# Patient Record
Sex: Female | Born: 1945
Health system: Southern US, Community
[De-identification: ages and names within clinical notes are randomized; demographics above are authoritative.]

## PROBLEM LIST (undated history)

## (undated) DIAGNOSIS — G2581 Restless legs syndrome: Secondary | ICD-10-CM

## (undated) DIAGNOSIS — F319 Bipolar disorder, unspecified: Secondary | ICD-10-CM

## (undated) DIAGNOSIS — E7800 Pure hypercholesterolemia, unspecified: Secondary | ICD-10-CM

## (undated) DIAGNOSIS — I10 Essential (primary) hypertension: Secondary | ICD-10-CM

## (undated) DIAGNOSIS — G20A1 Parkinson's disease without dyskinesia, without mention of fluctuations: Secondary | ICD-10-CM

## (undated) DIAGNOSIS — K219 Gastro-esophageal reflux disease without esophagitis: Secondary | ICD-10-CM

## (undated) DIAGNOSIS — F0390 Unspecified dementia without behavioral disturbance: Secondary | ICD-10-CM

## (undated) DIAGNOSIS — R531 Weakness: Secondary | ICD-10-CM

## (undated) DIAGNOSIS — N390 Urinary tract infection, site not specified: Secondary | ICD-10-CM

## (undated) DIAGNOSIS — G4700 Insomnia, unspecified: Secondary | ICD-10-CM

## (undated) DIAGNOSIS — G309 Alzheimer's disease, unspecified: Secondary | ICD-10-CM

## (undated) DIAGNOSIS — E079 Disorder of thyroid, unspecified: Secondary | ICD-10-CM

## (undated) DIAGNOSIS — F329 Major depressive disorder, single episode, unspecified: Secondary | ICD-10-CM

## (undated) DIAGNOSIS — G20 Parkinson's disease: Secondary | ICD-10-CM

## (undated) DIAGNOSIS — M519 Unspecified thoracic, thoracolumbar and lumbosacral intervertebral disc disorder: Secondary | ICD-10-CM

## (undated) DIAGNOSIS — F32A Depression, unspecified: Secondary | ICD-10-CM

## (undated) DIAGNOSIS — F419 Anxiety disorder, unspecified: Secondary | ICD-10-CM

## (undated) DIAGNOSIS — F0280 Dementia in other diseases classified elsewhere without behavioral disturbance: Secondary | ICD-10-CM

---

## 2014-05-15 DIAGNOSIS — M1990 Unspecified osteoarthritis, unspecified site: Secondary | ICD-10-CM | POA: Diagnosis not present

## 2014-05-15 DIAGNOSIS — F419 Anxiety disorder, unspecified: Secondary | ICD-10-CM | POA: Diagnosis not present

## 2014-05-15 DIAGNOSIS — R739 Hyperglycemia, unspecified: Secondary | ICD-10-CM | POA: Diagnosis not present

## 2014-05-15 DIAGNOSIS — E559 Vitamin D deficiency, unspecified: Secondary | ICD-10-CM | POA: Diagnosis not present

## 2014-05-15 DIAGNOSIS — E785 Hyperlipidemia, unspecified: Secondary | ICD-10-CM | POA: Diagnosis not present

## 2014-05-15 DIAGNOSIS — R29818 Other symptoms and signs involving the nervous system: Secondary | ICD-10-CM | POA: Diagnosis not present

## 2014-05-15 DIAGNOSIS — Z79899 Other long term (current) drug therapy: Secondary | ICD-10-CM | POA: Diagnosis not present

## 2014-05-15 DIAGNOSIS — E039 Hypothyroidism, unspecified: Secondary | ICD-10-CM | POA: Diagnosis not present

## 2014-05-26 DIAGNOSIS — Z1231 Encounter for screening mammogram for malignant neoplasm of breast: Secondary | ICD-10-CM | POA: Diagnosis not present

## 2014-05-26 DIAGNOSIS — K760 Fatty (change of) liver, not elsewhere classified: Secondary | ICD-10-CM | POA: Diagnosis not present

## 2014-06-09 DIAGNOSIS — R928 Other abnormal and inconclusive findings on diagnostic imaging of breast: Secondary | ICD-10-CM | POA: Diagnosis not present

## 2014-07-16 DIAGNOSIS — F419 Anxiety disorder, unspecified: Secondary | ICD-10-CM | POA: Diagnosis not present

## 2014-07-16 DIAGNOSIS — G2581 Restless legs syndrome: Secondary | ICD-10-CM | POA: Diagnosis not present

## 2014-07-16 DIAGNOSIS — Z1389 Encounter for screening for other disorder: Secondary | ICD-10-CM | POA: Diagnosis not present

## 2014-07-16 DIAGNOSIS — Z9181 History of falling: Secondary | ICD-10-CM | POA: Diagnosis not present

## 2014-07-16 DIAGNOSIS — M1990 Unspecified osteoarthritis, unspecified site: Secondary | ICD-10-CM | POA: Diagnosis not present

## 2014-07-16 DIAGNOSIS — Z6833 Body mass index (BMI) 33.0-33.9, adult: Secondary | ICD-10-CM | POA: Diagnosis not present

## 2014-07-16 DIAGNOSIS — Z79899 Other long term (current) drug therapy: Secondary | ICD-10-CM | POA: Diagnosis not present

## 2014-08-07 DIAGNOSIS — E039 Hypothyroidism, unspecified: Secondary | ICD-10-CM | POA: Diagnosis not present

## 2014-08-07 DIAGNOSIS — E785 Hyperlipidemia, unspecified: Secondary | ICD-10-CM | POA: Diagnosis not present

## 2014-08-07 DIAGNOSIS — K219 Gastro-esophageal reflux disease without esophagitis: Secondary | ICD-10-CM | POA: Diagnosis not present

## 2014-08-07 DIAGNOSIS — F329 Major depressive disorder, single episode, unspecified: Secondary | ICD-10-CM | POA: Diagnosis not present

## 2014-08-07 DIAGNOSIS — I10 Essential (primary) hypertension: Secondary | ICD-10-CM | POA: Diagnosis not present

## 2014-08-07 DIAGNOSIS — E669 Obesity, unspecified: Secondary | ICD-10-CM | POA: Diagnosis not present

## 2014-08-07 DIAGNOSIS — G309 Alzheimer's disease, unspecified: Secondary | ICD-10-CM | POA: Diagnosis not present

## 2014-08-07 DIAGNOSIS — M79606 Pain in leg, unspecified: Secondary | ICD-10-CM | POA: Diagnosis not present

## 2014-08-07 DIAGNOSIS — M545 Low back pain: Secondary | ICD-10-CM | POA: Diagnosis not present

## 2014-09-08 DIAGNOSIS — E785 Hyperlipidemia, unspecified: Secondary | ICD-10-CM | POA: Diagnosis not present

## 2014-09-08 DIAGNOSIS — R739 Hyperglycemia, unspecified: Secondary | ICD-10-CM | POA: Diagnosis not present

## 2014-09-08 DIAGNOSIS — Z9181 History of falling: Secondary | ICD-10-CM | POA: Diagnosis not present

## 2014-09-08 DIAGNOSIS — E559 Vitamin D deficiency, unspecified: Secondary | ICD-10-CM | POA: Diagnosis not present

## 2014-09-08 DIAGNOSIS — G2581 Restless legs syndrome: Secondary | ICD-10-CM | POA: Diagnosis not present

## 2014-09-08 DIAGNOSIS — E039 Hypothyroidism, unspecified: Secondary | ICD-10-CM | POA: Diagnosis not present

## 2014-09-08 DIAGNOSIS — M1990 Unspecified osteoarthritis, unspecified site: Secondary | ICD-10-CM | POA: Diagnosis not present

## 2014-09-08 DIAGNOSIS — Z79899 Other long term (current) drug therapy: Secondary | ICD-10-CM | POA: Diagnosis not present

## 2014-10-30 DIAGNOSIS — F329 Major depressive disorder, single episode, unspecified: Secondary | ICD-10-CM | POA: Diagnosis not present

## 2014-10-30 DIAGNOSIS — R1012 Left upper quadrant pain: Secondary | ICD-10-CM | POA: Diagnosis not present

## 2014-10-30 DIAGNOSIS — K760 Fatty (change of) liver, not elsewhere classified: Secondary | ICD-10-CM | POA: Diagnosis not present

## 2014-10-30 DIAGNOSIS — F419 Anxiety disorder, unspecified: Secondary | ICD-10-CM | POA: Diagnosis not present

## 2014-10-30 DIAGNOSIS — E780 Pure hypercholesterolemia: Secondary | ICD-10-CM | POA: Diagnosis not present

## 2014-10-30 DIAGNOSIS — R1032 Left lower quadrant pain: Secondary | ICD-10-CM | POA: Diagnosis not present

## 2014-10-30 DIAGNOSIS — I10 Essential (primary) hypertension: Secondary | ICD-10-CM | POA: Diagnosis not present

## 2014-10-30 DIAGNOSIS — F0390 Unspecified dementia without behavioral disturbance: Secondary | ICD-10-CM | POA: Diagnosis not present

## 2014-10-30 DIAGNOSIS — G20 Parkinson's disease: Secondary | ICD-10-CM | POA: Diagnosis not present

## 2014-10-30 DIAGNOSIS — I517 Cardiomegaly: Secondary | ICD-10-CM | POA: Diagnosis not present

## 2014-10-30 DIAGNOSIS — M1990 Unspecified osteoarthritis, unspecified site: Secondary | ICD-10-CM | POA: Diagnosis not present

## 2014-10-30 DIAGNOSIS — K219 Gastro-esophageal reflux disease without esophagitis: Secondary | ICD-10-CM | POA: Diagnosis not present

## 2014-11-03 DIAGNOSIS — Z79899 Other long term (current) drug therapy: Secondary | ICD-10-CM | POA: Diagnosis not present

## 2014-11-03 DIAGNOSIS — K219 Gastro-esophageal reflux disease without esophagitis: Secondary | ICD-10-CM | POA: Diagnosis not present

## 2014-11-03 DIAGNOSIS — G20 Parkinson's disease: Secondary | ICD-10-CM | POA: Diagnosis not present

## 2014-11-03 DIAGNOSIS — R739 Hyperglycemia, unspecified: Secondary | ICD-10-CM | POA: Diagnosis not present

## 2014-11-03 DIAGNOSIS — R29818 Other symptoms and signs involving the nervous system: Secondary | ICD-10-CM | POA: Diagnosis not present

## 2014-11-03 DIAGNOSIS — Z6832 Body mass index (BMI) 32.0-32.9, adult: Secondary | ICD-10-CM | POA: Diagnosis not present

## 2014-11-03 DIAGNOSIS — M1990 Unspecified osteoarthritis, unspecified site: Secondary | ICD-10-CM | POA: Diagnosis not present

## 2014-12-14 DIAGNOSIS — Z79899 Other long term (current) drug therapy: Secondary | ICD-10-CM | POA: Diagnosis not present

## 2014-12-14 DIAGNOSIS — R531 Weakness: Secondary | ICD-10-CM | POA: Diagnosis not present

## 2014-12-14 DIAGNOSIS — I10 Essential (primary) hypertension: Secondary | ICD-10-CM | POA: Diagnosis not present

## 2014-12-14 DIAGNOSIS — R404 Transient alteration of awareness: Secondary | ICD-10-CM | POA: Diagnosis not present

## 2014-12-14 DIAGNOSIS — R079 Chest pain, unspecified: Secondary | ICD-10-CM | POA: Diagnosis not present

## 2014-12-18 DIAGNOSIS — G20 Parkinson's disease: Secondary | ICD-10-CM | POA: Diagnosis not present

## 2014-12-18 DIAGNOSIS — R531 Weakness: Secondary | ICD-10-CM | POA: Diagnosis not present

## 2014-12-18 DIAGNOSIS — Z6831 Body mass index (BMI) 31.0-31.9, adult: Secondary | ICD-10-CM | POA: Diagnosis not present

## 2014-12-18 DIAGNOSIS — I10 Essential (primary) hypertension: Secondary | ICD-10-CM | POA: Diagnosis not present

## 2014-12-18 DIAGNOSIS — K219 Gastro-esophageal reflux disease without esophagitis: Secondary | ICD-10-CM | POA: Diagnosis not present

## 2014-12-18 DIAGNOSIS — R928 Other abnormal and inconclusive findings on diagnostic imaging of breast: Secondary | ICD-10-CM | POA: Diagnosis not present

## 2014-12-18 DIAGNOSIS — F419 Anxiety disorder, unspecified: Secondary | ICD-10-CM | POA: Diagnosis not present

## 2014-12-28 DIAGNOSIS — R928 Other abnormal and inconclusive findings on diagnostic imaging of breast: Secondary | ICD-10-CM | POA: Diagnosis not present

## 2014-12-28 DIAGNOSIS — N6489 Other specified disorders of breast: Secondary | ICD-10-CM | POA: Diagnosis not present

## 2015-01-14 DIAGNOSIS — E663 Overweight: Secondary | ICD-10-CM | POA: Diagnosis not present

## 2015-01-14 DIAGNOSIS — I10 Essential (primary) hypertension: Secondary | ICD-10-CM | POA: Diagnosis not present

## 2015-01-14 DIAGNOSIS — G894 Chronic pain syndrome: Secondary | ICD-10-CM | POA: Diagnosis not present

## 2015-01-14 DIAGNOSIS — F419 Anxiety disorder, unspecified: Secondary | ICD-10-CM | POA: Diagnosis not present

## 2015-01-14 DIAGNOSIS — Z6829 Body mass index (BMI) 29.0-29.9, adult: Secondary | ICD-10-CM | POA: Diagnosis not present

## 2015-01-26 DIAGNOSIS — E785 Hyperlipidemia, unspecified: Secondary | ICD-10-CM | POA: Diagnosis not present

## 2015-01-26 DIAGNOSIS — R739 Hyperglycemia, unspecified: Secondary | ICD-10-CM | POA: Diagnosis not present

## 2015-01-26 DIAGNOSIS — F419 Anxiety disorder, unspecified: Secondary | ICD-10-CM | POA: Diagnosis not present

## 2015-01-26 DIAGNOSIS — E559 Vitamin D deficiency, unspecified: Secondary | ICD-10-CM | POA: Diagnosis not present

## 2015-01-26 DIAGNOSIS — Z79899 Other long term (current) drug therapy: Secondary | ICD-10-CM | POA: Diagnosis not present

## 2015-01-26 DIAGNOSIS — K219 Gastro-esophageal reflux disease without esophagitis: Secondary | ICD-10-CM | POA: Diagnosis not present

## 2015-01-26 DIAGNOSIS — E039 Hypothyroidism, unspecified: Secondary | ICD-10-CM | POA: Diagnosis not present

## 2015-01-26 DIAGNOSIS — I10 Essential (primary) hypertension: Secondary | ICD-10-CM | POA: Diagnosis not present

## 2015-03-01 DIAGNOSIS — F0280 Dementia in other diseases classified elsewhere without behavioral disturbance: Secondary | ICD-10-CM | POA: Diagnosis not present

## 2015-03-01 DIAGNOSIS — G301 Alzheimer's disease with late onset: Secondary | ICD-10-CM | POA: Diagnosis not present

## 2015-03-01 DIAGNOSIS — G2111 Neuroleptic induced parkinsonism: Secondary | ICD-10-CM | POA: Diagnosis not present

## 2015-03-02 DIAGNOSIS — Z79899 Other long term (current) drug therapy: Secondary | ICD-10-CM | POA: Diagnosis not present

## 2015-03-02 DIAGNOSIS — R300 Dysuria: Secondary | ICD-10-CM | POA: Diagnosis not present

## 2015-03-02 DIAGNOSIS — K219 Gastro-esophageal reflux disease without esophagitis: Secondary | ICD-10-CM | POA: Diagnosis not present

## 2015-03-02 DIAGNOSIS — N390 Urinary tract infection, site not specified: Secondary | ICD-10-CM | POA: Diagnosis not present

## 2015-03-02 DIAGNOSIS — Z1389 Encounter for screening for other disorder: Secondary | ICD-10-CM | POA: Diagnosis not present

## 2015-03-02 DIAGNOSIS — R531 Weakness: Secondary | ICD-10-CM | POA: Diagnosis not present

## 2015-03-02 DIAGNOSIS — F419 Anxiety disorder, unspecified: Secondary | ICD-10-CM | POA: Diagnosis not present

## 2015-03-02 DIAGNOSIS — M1990 Unspecified osteoarthritis, unspecified site: Secondary | ICD-10-CM | POA: Diagnosis not present

## 2015-05-03 DIAGNOSIS — Z79899 Other long term (current) drug therapy: Secondary | ICD-10-CM | POA: Diagnosis not present

## 2015-05-03 DIAGNOSIS — E039 Hypothyroidism, unspecified: Secondary | ICD-10-CM | POA: Diagnosis not present

## 2015-05-03 DIAGNOSIS — R739 Hyperglycemia, unspecified: Secondary | ICD-10-CM | POA: Diagnosis not present

## 2015-05-03 DIAGNOSIS — M1990 Unspecified osteoarthritis, unspecified site: Secondary | ICD-10-CM | POA: Diagnosis not present

## 2015-05-03 DIAGNOSIS — E559 Vitamin D deficiency, unspecified: Secondary | ICD-10-CM | POA: Diagnosis not present

## 2015-05-03 DIAGNOSIS — Z23 Encounter for immunization: Secondary | ICD-10-CM | POA: Diagnosis not present

## 2015-05-03 DIAGNOSIS — I10 Essential (primary) hypertension: Secondary | ICD-10-CM | POA: Diagnosis not present

## 2015-05-03 DIAGNOSIS — Z1389 Encounter for screening for other disorder: Secondary | ICD-10-CM | POA: Diagnosis not present

## 2015-05-03 DIAGNOSIS — E785 Hyperlipidemia, unspecified: Secondary | ICD-10-CM | POA: Diagnosis not present

## 2015-05-03 DIAGNOSIS — G309 Alzheimer's disease, unspecified: Secondary | ICD-10-CM | POA: Diagnosis not present

## 2015-05-25 DIAGNOSIS — H353132 Nonexudative age-related macular degeneration, bilateral, intermediate dry stage: Secondary | ICD-10-CM | POA: Diagnosis not present

## 2015-05-25 DIAGNOSIS — H5213 Myopia, bilateral: Secondary | ICD-10-CM | POA: Diagnosis not present

## 2015-05-25 DIAGNOSIS — H5210 Myopia, unspecified eye: Secondary | ICD-10-CM | POA: Diagnosis not present

## 2015-06-30 DIAGNOSIS — G894 Chronic pain syndrome: Secondary | ICD-10-CM | POA: Diagnosis not present

## 2015-06-30 DIAGNOSIS — R300 Dysuria: Secondary | ICD-10-CM | POA: Diagnosis not present

## 2015-06-30 DIAGNOSIS — Z79899 Other long term (current) drug therapy: Secondary | ICD-10-CM | POA: Diagnosis not present

## 2015-06-30 DIAGNOSIS — Z6824 Body mass index (BMI) 24.0-24.9, adult: Secondary | ICD-10-CM | POA: Diagnosis not present

## 2015-07-01 DIAGNOSIS — T402X4A Poisoning by other opioids, undetermined, initial encounter: Secondary | ICD-10-CM | POA: Diagnosis not present

## 2015-07-01 DIAGNOSIS — I469 Cardiac arrest, cause unspecified: Secondary | ICD-10-CM | POA: Diagnosis not present

## 2015-07-01 DIAGNOSIS — F0280 Dementia in other diseases classified elsewhere without behavioral disturbance: Secondary | ICD-10-CM | POA: Diagnosis not present

## 2015-07-01 DIAGNOSIS — R55 Syncope and collapse: Secondary | ICD-10-CM | POA: Diagnosis not present

## 2015-07-01 DIAGNOSIS — M545 Low back pain: Secondary | ICD-10-CM | POA: Diagnosis not present

## 2015-07-01 DIAGNOSIS — F419 Anxiety disorder, unspecified: Secondary | ICD-10-CM | POA: Diagnosis not present

## 2015-07-01 DIAGNOSIS — Z79899 Other long term (current) drug therapy: Secondary | ICD-10-CM | POA: Diagnosis not present

## 2015-07-01 DIAGNOSIS — T40602A Poisoning by unspecified narcotics, intentional self-harm, initial encounter: Secondary | ICD-10-CM | POA: Diagnosis not present

## 2015-07-01 DIAGNOSIS — G20 Parkinson's disease: Secondary | ICD-10-CM | POA: Diagnosis not present

## 2015-07-01 DIAGNOSIS — I160 Hypertensive urgency: Secondary | ICD-10-CM | POA: Diagnosis not present

## 2015-07-01 DIAGNOSIS — F333 Major depressive disorder, recurrent, severe with psychotic symptoms: Secondary | ICD-10-CM | POA: Diagnosis not present

## 2015-07-01 DIAGNOSIS — E785 Hyperlipidemia, unspecified: Secondary | ICD-10-CM | POA: Diagnosis not present

## 2015-07-01 DIAGNOSIS — R402431 Glasgow coma scale score 3-8, in the field [EMT or ambulance]: Secondary | ICD-10-CM | POA: Diagnosis not present

## 2015-07-01 DIAGNOSIS — I10 Essential (primary) hypertension: Secondary | ICD-10-CM | POA: Diagnosis not present

## 2015-07-01 DIAGNOSIS — G894 Chronic pain syndrome: Secondary | ICD-10-CM | POA: Diagnosis not present

## 2015-07-01 DIAGNOSIS — Z7982 Long term (current) use of aspirin: Secondary | ICD-10-CM | POA: Diagnosis not present

## 2015-07-01 DIAGNOSIS — K219 Gastro-esophageal reflux disease without esophagitis: Secondary | ICD-10-CM | POA: Diagnosis not present

## 2015-07-01 DIAGNOSIS — M1990 Unspecified osteoarthritis, unspecified site: Secondary | ICD-10-CM | POA: Diagnosis not present

## 2015-07-01 DIAGNOSIS — R9431 Abnormal electrocardiogram [ECG] [EKG]: Secondary | ICD-10-CM | POA: Diagnosis not present

## 2015-07-01 DIAGNOSIS — Z79891 Long term (current) use of opiate analgesic: Secondary | ICD-10-CM | POA: Diagnosis not present

## 2015-07-01 DIAGNOSIS — E039 Hypothyroidism, unspecified: Secondary | ICD-10-CM | POA: Diagnosis not present

## 2015-07-01 DIAGNOSIS — I361 Nonrheumatic tricuspid (valve) insufficiency: Secondary | ICD-10-CM | POA: Diagnosis not present

## 2015-07-01 DIAGNOSIS — T40601S Poisoning by unspecified narcotics, accidental (unintentional), sequela: Secondary | ICD-10-CM | POA: Diagnosis not present

## 2015-07-09 DIAGNOSIS — K219 Gastro-esophageal reflux disease without esophagitis: Secondary | ICD-10-CM | POA: Diagnosis not present

## 2015-07-09 DIAGNOSIS — F0390 Unspecified dementia without behavioral disturbance: Secondary | ICD-10-CM | POA: Diagnosis not present

## 2015-07-09 DIAGNOSIS — G20 Parkinson's disease: Secondary | ICD-10-CM | POA: Diagnosis not present

## 2015-07-09 DIAGNOSIS — E784 Other hyperlipidemia: Secondary | ICD-10-CM | POA: Diagnosis not present

## 2015-07-09 DIAGNOSIS — F419 Anxiety disorder, unspecified: Secondary | ICD-10-CM | POA: Diagnosis not present

## 2015-07-09 DIAGNOSIS — M1990 Unspecified osteoarthritis, unspecified site: Secondary | ICD-10-CM | POA: Diagnosis not present

## 2015-07-09 DIAGNOSIS — E785 Hyperlipidemia, unspecified: Secondary | ICD-10-CM | POA: Diagnosis not present

## 2015-07-09 DIAGNOSIS — I10 Essential (primary) hypertension: Secondary | ICD-10-CM | POA: Diagnosis not present

## 2015-07-09 DIAGNOSIS — E039 Hypothyroidism, unspecified: Secondary | ICD-10-CM | POA: Diagnosis not present

## 2015-07-09 DIAGNOSIS — T40691D Poisoning by other narcotics, accidental (unintentional), subsequent encounter: Secondary | ICD-10-CM | POA: Diagnosis not present

## 2015-07-09 DIAGNOSIS — T40601S Poisoning by unspecified narcotics, accidental (unintentional), sequela: Secondary | ICD-10-CM | POA: Diagnosis not present

## 2015-07-09 DIAGNOSIS — F333 Major depressive disorder, recurrent, severe with psychotic symptoms: Secondary | ICD-10-CM | POA: Diagnosis not present

## 2015-07-16 DIAGNOSIS — T40691D Poisoning by other narcotics, accidental (unintentional), subsequent encounter: Secondary | ICD-10-CM | POA: Diagnosis not present

## 2015-07-16 DIAGNOSIS — E784 Other hyperlipidemia: Secondary | ICD-10-CM | POA: Diagnosis not present

## 2015-07-16 DIAGNOSIS — I10 Essential (primary) hypertension: Secondary | ICD-10-CM | POA: Diagnosis not present

## 2015-07-16 DIAGNOSIS — G20 Parkinson's disease: Secondary | ICD-10-CM | POA: Diagnosis not present

## 2015-07-20 DIAGNOSIS — G8929 Other chronic pain: Secondary | ICD-10-CM | POA: Diagnosis not present

## 2015-07-20 DIAGNOSIS — Z9181 History of falling: Secondary | ICD-10-CM | POA: Diagnosis not present

## 2015-07-20 DIAGNOSIS — I10 Essential (primary) hypertension: Secondary | ICD-10-CM | POA: Diagnosis not present

## 2015-07-20 DIAGNOSIS — Z8674 Personal history of sudden cardiac arrest: Secondary | ICD-10-CM | POA: Diagnosis not present

## 2015-07-20 DIAGNOSIS — G301 Alzheimer's disease with late onset: Secondary | ICD-10-CM | POA: Diagnosis not present

## 2015-07-20 DIAGNOSIS — F419 Anxiety disorder, unspecified: Secondary | ICD-10-CM | POA: Diagnosis not present

## 2015-07-20 DIAGNOSIS — M6281 Muscle weakness (generalized): Secondary | ICD-10-CM | POA: Diagnosis not present

## 2015-07-20 DIAGNOSIS — F0280 Dementia in other diseases classified elsewhere without behavioral disturbance: Secondary | ICD-10-CM | POA: Diagnosis not present

## 2015-07-20 DIAGNOSIS — F329 Major depressive disorder, single episode, unspecified: Secondary | ICD-10-CM | POA: Diagnosis not present

## 2015-07-22 DIAGNOSIS — Z8674 Personal history of sudden cardiac arrest: Secondary | ICD-10-CM | POA: Diagnosis not present

## 2015-07-22 DIAGNOSIS — F0280 Dementia in other diseases classified elsewhere without behavioral disturbance: Secondary | ICD-10-CM | POA: Diagnosis not present

## 2015-07-22 DIAGNOSIS — G8929 Other chronic pain: Secondary | ICD-10-CM | POA: Diagnosis not present

## 2015-07-22 DIAGNOSIS — G301 Alzheimer's disease with late onset: Secondary | ICD-10-CM | POA: Diagnosis not present

## 2015-07-22 DIAGNOSIS — F329 Major depressive disorder, single episode, unspecified: Secondary | ICD-10-CM | POA: Diagnosis not present

## 2015-07-22 DIAGNOSIS — Z9181 History of falling: Secondary | ICD-10-CM | POA: Diagnosis not present

## 2015-07-22 DIAGNOSIS — I10 Essential (primary) hypertension: Secondary | ICD-10-CM | POA: Diagnosis not present

## 2015-07-22 DIAGNOSIS — F419 Anxiety disorder, unspecified: Secondary | ICD-10-CM | POA: Diagnosis not present

## 2015-07-22 DIAGNOSIS — M6281 Muscle weakness (generalized): Secondary | ICD-10-CM | POA: Diagnosis not present

## 2015-07-23 DIAGNOSIS — T50901A Poisoning by unspecified drugs, medicaments and biological substances, accidental (unintentional), initial encounter: Secondary | ICD-10-CM | POA: Diagnosis not present

## 2015-07-23 DIAGNOSIS — Z8674 Personal history of sudden cardiac arrest: Secondary | ICD-10-CM | POA: Diagnosis not present

## 2015-07-23 DIAGNOSIS — F419 Anxiety disorder, unspecified: Secondary | ICD-10-CM | POA: Diagnosis not present

## 2015-07-23 DIAGNOSIS — Z9181 History of falling: Secondary | ICD-10-CM | POA: Diagnosis not present

## 2015-07-23 DIAGNOSIS — G894 Chronic pain syndrome: Secondary | ICD-10-CM | POA: Diagnosis not present

## 2015-07-23 DIAGNOSIS — E039 Hypothyroidism, unspecified: Secondary | ICD-10-CM | POA: Diagnosis not present

## 2015-07-23 DIAGNOSIS — I10 Essential (primary) hypertension: Secondary | ICD-10-CM | POA: Diagnosis not present

## 2015-07-23 DIAGNOSIS — Z6823 Body mass index (BMI) 23.0-23.9, adult: Secondary | ICD-10-CM | POA: Diagnosis not present

## 2015-07-23 DIAGNOSIS — M6281 Muscle weakness (generalized): Secondary | ICD-10-CM | POA: Diagnosis not present

## 2015-07-23 DIAGNOSIS — G301 Alzheimer's disease with late onset: Secondary | ICD-10-CM | POA: Diagnosis not present

## 2015-07-23 DIAGNOSIS — F329 Major depressive disorder, single episode, unspecified: Secondary | ICD-10-CM | POA: Diagnosis not present

## 2015-07-23 DIAGNOSIS — Z79899 Other long term (current) drug therapy: Secondary | ICD-10-CM | POA: Diagnosis not present

## 2015-07-23 DIAGNOSIS — F0280 Dementia in other diseases classified elsewhere without behavioral disturbance: Secondary | ICD-10-CM | POA: Diagnosis not present

## 2015-07-23 DIAGNOSIS — R296 Repeated falls: Secondary | ICD-10-CM | POA: Diagnosis not present

## 2015-07-23 DIAGNOSIS — G8929 Other chronic pain: Secondary | ICD-10-CM | POA: Diagnosis not present

## 2015-07-26 DIAGNOSIS — G8929 Other chronic pain: Secondary | ICD-10-CM | POA: Diagnosis not present

## 2015-07-26 DIAGNOSIS — Z9181 History of falling: Secondary | ICD-10-CM | POA: Diagnosis not present

## 2015-07-26 DIAGNOSIS — F329 Major depressive disorder, single episode, unspecified: Secondary | ICD-10-CM | POA: Diagnosis not present

## 2015-07-26 DIAGNOSIS — I10 Essential (primary) hypertension: Secondary | ICD-10-CM | POA: Diagnosis not present

## 2015-07-26 DIAGNOSIS — Z8674 Personal history of sudden cardiac arrest: Secondary | ICD-10-CM | POA: Diagnosis not present

## 2015-07-26 DIAGNOSIS — F419 Anxiety disorder, unspecified: Secondary | ICD-10-CM | POA: Diagnosis not present

## 2015-07-26 DIAGNOSIS — G301 Alzheimer's disease with late onset: Secondary | ICD-10-CM | POA: Diagnosis not present

## 2015-07-26 DIAGNOSIS — M6281 Muscle weakness (generalized): Secondary | ICD-10-CM | POA: Diagnosis not present

## 2015-07-26 DIAGNOSIS — F0280 Dementia in other diseases classified elsewhere without behavioral disturbance: Secondary | ICD-10-CM | POA: Diagnosis not present

## 2015-07-27 DIAGNOSIS — G301 Alzheimer's disease with late onset: Secondary | ICD-10-CM | POA: Diagnosis not present

## 2015-07-27 DIAGNOSIS — F419 Anxiety disorder, unspecified: Secondary | ICD-10-CM | POA: Diagnosis not present

## 2015-07-27 DIAGNOSIS — F0280 Dementia in other diseases classified elsewhere without behavioral disturbance: Secondary | ICD-10-CM | POA: Diagnosis not present

## 2015-07-27 DIAGNOSIS — Z9181 History of falling: Secondary | ICD-10-CM | POA: Diagnosis not present

## 2015-07-27 DIAGNOSIS — I10 Essential (primary) hypertension: Secondary | ICD-10-CM | POA: Diagnosis not present

## 2015-07-27 DIAGNOSIS — G8929 Other chronic pain: Secondary | ICD-10-CM | POA: Diagnosis not present

## 2015-07-27 DIAGNOSIS — Z8674 Personal history of sudden cardiac arrest: Secondary | ICD-10-CM | POA: Diagnosis not present

## 2015-07-27 DIAGNOSIS — F329 Major depressive disorder, single episode, unspecified: Secondary | ICD-10-CM | POA: Diagnosis not present

## 2015-07-27 DIAGNOSIS — M6281 Muscle weakness (generalized): Secondary | ICD-10-CM | POA: Diagnosis not present

## 2015-07-28 DIAGNOSIS — I10 Essential (primary) hypertension: Secondary | ICD-10-CM | POA: Diagnosis not present

## 2015-07-28 DIAGNOSIS — G8929 Other chronic pain: Secondary | ICD-10-CM | POA: Diagnosis not present

## 2015-07-28 DIAGNOSIS — M6281 Muscle weakness (generalized): Secondary | ICD-10-CM | POA: Diagnosis not present

## 2015-07-28 DIAGNOSIS — F419 Anxiety disorder, unspecified: Secondary | ICD-10-CM | POA: Diagnosis not present

## 2015-07-28 DIAGNOSIS — F0280 Dementia in other diseases classified elsewhere without behavioral disturbance: Secondary | ICD-10-CM | POA: Diagnosis not present

## 2015-07-28 DIAGNOSIS — G301 Alzheimer's disease with late onset: Secondary | ICD-10-CM | POA: Diagnosis not present

## 2015-07-28 DIAGNOSIS — Z8674 Personal history of sudden cardiac arrest: Secondary | ICD-10-CM | POA: Diagnosis not present

## 2015-07-28 DIAGNOSIS — Z9181 History of falling: Secondary | ICD-10-CM | POA: Diagnosis not present

## 2015-07-28 DIAGNOSIS — F329 Major depressive disorder, single episode, unspecified: Secondary | ICD-10-CM | POA: Diagnosis not present

## 2015-07-29 DIAGNOSIS — G8929 Other chronic pain: Secondary | ICD-10-CM | POA: Diagnosis not present

## 2015-07-29 DIAGNOSIS — Z9181 History of falling: Secondary | ICD-10-CM | POA: Diagnosis not present

## 2015-07-29 DIAGNOSIS — F0280 Dementia in other diseases classified elsewhere without behavioral disturbance: Secondary | ICD-10-CM | POA: Diagnosis not present

## 2015-07-29 DIAGNOSIS — F419 Anxiety disorder, unspecified: Secondary | ICD-10-CM | POA: Diagnosis not present

## 2015-07-29 DIAGNOSIS — I10 Essential (primary) hypertension: Secondary | ICD-10-CM | POA: Diagnosis not present

## 2015-07-29 DIAGNOSIS — M6281 Muscle weakness (generalized): Secondary | ICD-10-CM | POA: Diagnosis not present

## 2015-07-29 DIAGNOSIS — G301 Alzheimer's disease with late onset: Secondary | ICD-10-CM | POA: Diagnosis not present

## 2015-07-29 DIAGNOSIS — F329 Major depressive disorder, single episode, unspecified: Secondary | ICD-10-CM | POA: Diagnosis not present

## 2015-07-29 DIAGNOSIS — Z8674 Personal history of sudden cardiac arrest: Secondary | ICD-10-CM | POA: Diagnosis not present

## 2015-08-03 DIAGNOSIS — G8929 Other chronic pain: Secondary | ICD-10-CM | POA: Diagnosis not present

## 2015-08-03 DIAGNOSIS — F419 Anxiety disorder, unspecified: Secondary | ICD-10-CM | POA: Diagnosis not present

## 2015-08-03 DIAGNOSIS — F329 Major depressive disorder, single episode, unspecified: Secondary | ICD-10-CM | POA: Diagnosis not present

## 2015-08-03 DIAGNOSIS — F0280 Dementia in other diseases classified elsewhere without behavioral disturbance: Secondary | ICD-10-CM | POA: Diagnosis not present

## 2015-08-03 DIAGNOSIS — Z9181 History of falling: Secondary | ICD-10-CM | POA: Diagnosis not present

## 2015-08-03 DIAGNOSIS — I10 Essential (primary) hypertension: Secondary | ICD-10-CM | POA: Diagnosis not present

## 2015-08-03 DIAGNOSIS — G301 Alzheimer's disease with late onset: Secondary | ICD-10-CM | POA: Diagnosis not present

## 2015-08-03 DIAGNOSIS — M6281 Muscle weakness (generalized): Secondary | ICD-10-CM | POA: Diagnosis not present

## 2015-08-03 DIAGNOSIS — Z8674 Personal history of sudden cardiac arrest: Secondary | ICD-10-CM | POA: Diagnosis not present

## 2015-08-04 DIAGNOSIS — G301 Alzheimer's disease with late onset: Secondary | ICD-10-CM | POA: Diagnosis not present

## 2015-08-04 DIAGNOSIS — F329 Major depressive disorder, single episode, unspecified: Secondary | ICD-10-CM | POA: Diagnosis not present

## 2015-08-04 DIAGNOSIS — Z9181 History of falling: Secondary | ICD-10-CM | POA: Diagnosis not present

## 2015-08-04 DIAGNOSIS — Z8674 Personal history of sudden cardiac arrest: Secondary | ICD-10-CM | POA: Diagnosis not present

## 2015-08-04 DIAGNOSIS — G8929 Other chronic pain: Secondary | ICD-10-CM | POA: Diagnosis not present

## 2015-08-04 DIAGNOSIS — M6281 Muscle weakness (generalized): Secondary | ICD-10-CM | POA: Diagnosis not present

## 2015-08-04 DIAGNOSIS — F419 Anxiety disorder, unspecified: Secondary | ICD-10-CM | POA: Diagnosis not present

## 2015-08-04 DIAGNOSIS — I10 Essential (primary) hypertension: Secondary | ICD-10-CM | POA: Diagnosis not present

## 2015-08-04 DIAGNOSIS — F0280 Dementia in other diseases classified elsewhere without behavioral disturbance: Secondary | ICD-10-CM | POA: Diagnosis not present

## 2015-08-06 DIAGNOSIS — F419 Anxiety disorder, unspecified: Secondary | ICD-10-CM | POA: Diagnosis not present

## 2015-08-06 DIAGNOSIS — F329 Major depressive disorder, single episode, unspecified: Secondary | ICD-10-CM | POA: Diagnosis not present

## 2015-08-06 DIAGNOSIS — Z9181 History of falling: Secondary | ICD-10-CM | POA: Diagnosis not present

## 2015-08-06 DIAGNOSIS — I10 Essential (primary) hypertension: Secondary | ICD-10-CM | POA: Diagnosis not present

## 2015-08-06 DIAGNOSIS — F0280 Dementia in other diseases classified elsewhere without behavioral disturbance: Secondary | ICD-10-CM | POA: Diagnosis not present

## 2015-08-06 DIAGNOSIS — Z8674 Personal history of sudden cardiac arrest: Secondary | ICD-10-CM | POA: Diagnosis not present

## 2015-08-06 DIAGNOSIS — M6281 Muscle weakness (generalized): Secondary | ICD-10-CM | POA: Diagnosis not present

## 2015-08-06 DIAGNOSIS — G301 Alzheimer's disease with late onset: Secondary | ICD-10-CM | POA: Diagnosis not present

## 2015-08-06 DIAGNOSIS — G8929 Other chronic pain: Secondary | ICD-10-CM | POA: Diagnosis not present

## 2015-08-09 DIAGNOSIS — M6281 Muscle weakness (generalized): Secondary | ICD-10-CM | POA: Diagnosis not present

## 2015-08-09 DIAGNOSIS — I10 Essential (primary) hypertension: Secondary | ICD-10-CM | POA: Diagnosis not present

## 2015-08-09 DIAGNOSIS — G301 Alzheimer's disease with late onset: Secondary | ICD-10-CM | POA: Diagnosis not present

## 2015-08-09 DIAGNOSIS — F0280 Dementia in other diseases classified elsewhere without behavioral disturbance: Secondary | ICD-10-CM | POA: Diagnosis not present

## 2015-08-09 DIAGNOSIS — Z9181 History of falling: Secondary | ICD-10-CM | POA: Diagnosis not present

## 2015-08-09 DIAGNOSIS — G8929 Other chronic pain: Secondary | ICD-10-CM | POA: Diagnosis not present

## 2015-08-09 DIAGNOSIS — Z8674 Personal history of sudden cardiac arrest: Secondary | ICD-10-CM | POA: Diagnosis not present

## 2015-08-09 DIAGNOSIS — F329 Major depressive disorder, single episode, unspecified: Secondary | ICD-10-CM | POA: Diagnosis not present

## 2015-08-09 DIAGNOSIS — F419 Anxiety disorder, unspecified: Secondary | ICD-10-CM | POA: Diagnosis not present

## 2015-08-10 DIAGNOSIS — G301 Alzheimer's disease with late onset: Secondary | ICD-10-CM | POA: Diagnosis not present

## 2015-08-10 DIAGNOSIS — I10 Essential (primary) hypertension: Secondary | ICD-10-CM | POA: Diagnosis not present

## 2015-08-10 DIAGNOSIS — F0280 Dementia in other diseases classified elsewhere without behavioral disturbance: Secondary | ICD-10-CM | POA: Diagnosis not present

## 2015-08-10 DIAGNOSIS — Z8674 Personal history of sudden cardiac arrest: Secondary | ICD-10-CM | POA: Diagnosis not present

## 2015-08-10 DIAGNOSIS — F419 Anxiety disorder, unspecified: Secondary | ICD-10-CM | POA: Diagnosis not present

## 2015-08-10 DIAGNOSIS — M6281 Muscle weakness (generalized): Secondary | ICD-10-CM | POA: Diagnosis not present

## 2015-08-10 DIAGNOSIS — G8929 Other chronic pain: Secondary | ICD-10-CM | POA: Diagnosis not present

## 2015-08-10 DIAGNOSIS — Z9181 History of falling: Secondary | ICD-10-CM | POA: Diagnosis not present

## 2015-08-10 DIAGNOSIS — F329 Major depressive disorder, single episode, unspecified: Secondary | ICD-10-CM | POA: Diagnosis not present

## 2015-08-11 DIAGNOSIS — F329 Major depressive disorder, single episode, unspecified: Secondary | ICD-10-CM | POA: Diagnosis not present

## 2015-08-11 DIAGNOSIS — G8929 Other chronic pain: Secondary | ICD-10-CM | POA: Diagnosis not present

## 2015-08-11 DIAGNOSIS — F419 Anxiety disorder, unspecified: Secondary | ICD-10-CM | POA: Diagnosis not present

## 2015-08-11 DIAGNOSIS — Z9181 History of falling: Secondary | ICD-10-CM | POA: Diagnosis not present

## 2015-08-11 DIAGNOSIS — F0280 Dementia in other diseases classified elsewhere without behavioral disturbance: Secondary | ICD-10-CM | POA: Diagnosis not present

## 2015-08-11 DIAGNOSIS — M6281 Muscle weakness (generalized): Secondary | ICD-10-CM | POA: Diagnosis not present

## 2015-08-11 DIAGNOSIS — G301 Alzheimer's disease with late onset: Secondary | ICD-10-CM | POA: Diagnosis not present

## 2015-08-11 DIAGNOSIS — I10 Essential (primary) hypertension: Secondary | ICD-10-CM | POA: Diagnosis not present

## 2015-08-11 DIAGNOSIS — Z8674 Personal history of sudden cardiac arrest: Secondary | ICD-10-CM | POA: Diagnosis not present

## 2015-08-17 DIAGNOSIS — Z9181 History of falling: Secondary | ICD-10-CM | POA: Diagnosis not present

## 2015-08-17 DIAGNOSIS — F329 Major depressive disorder, single episode, unspecified: Secondary | ICD-10-CM | POA: Diagnosis not present

## 2015-08-17 DIAGNOSIS — M6281 Muscle weakness (generalized): Secondary | ICD-10-CM | POA: Diagnosis not present

## 2015-08-17 DIAGNOSIS — I10 Essential (primary) hypertension: Secondary | ICD-10-CM | POA: Diagnosis not present

## 2015-08-17 DIAGNOSIS — G301 Alzheimer's disease with late onset: Secondary | ICD-10-CM | POA: Diagnosis not present

## 2015-08-17 DIAGNOSIS — F0280 Dementia in other diseases classified elsewhere without behavioral disturbance: Secondary | ICD-10-CM | POA: Diagnosis not present

## 2015-08-17 DIAGNOSIS — Z8674 Personal history of sudden cardiac arrest: Secondary | ICD-10-CM | POA: Diagnosis not present

## 2015-08-17 DIAGNOSIS — F419 Anxiety disorder, unspecified: Secondary | ICD-10-CM | POA: Diagnosis not present

## 2015-08-17 DIAGNOSIS — G8929 Other chronic pain: Secondary | ICD-10-CM | POA: Diagnosis not present

## 2015-08-19 DIAGNOSIS — F329 Major depressive disorder, single episode, unspecified: Secondary | ICD-10-CM | POA: Diagnosis not present

## 2015-08-19 DIAGNOSIS — F419 Anxiety disorder, unspecified: Secondary | ICD-10-CM | POA: Diagnosis not present

## 2015-08-19 DIAGNOSIS — Z8674 Personal history of sudden cardiac arrest: Secondary | ICD-10-CM | POA: Diagnosis not present

## 2015-08-19 DIAGNOSIS — G301 Alzheimer's disease with late onset: Secondary | ICD-10-CM | POA: Diagnosis not present

## 2015-08-19 DIAGNOSIS — G8929 Other chronic pain: Secondary | ICD-10-CM | POA: Diagnosis not present

## 2015-08-19 DIAGNOSIS — F0280 Dementia in other diseases classified elsewhere without behavioral disturbance: Secondary | ICD-10-CM | POA: Diagnosis not present

## 2015-08-19 DIAGNOSIS — M6281 Muscle weakness (generalized): Secondary | ICD-10-CM | POA: Diagnosis not present

## 2015-08-19 DIAGNOSIS — Z9181 History of falling: Secondary | ICD-10-CM | POA: Diagnosis not present

## 2015-08-19 DIAGNOSIS — I10 Essential (primary) hypertension: Secondary | ICD-10-CM | POA: Diagnosis not present

## 2015-08-27 DIAGNOSIS — R29898 Other symptoms and signs involving the musculoskeletal system: Secondary | ICD-10-CM | POA: Diagnosis not present

## 2015-08-27 DIAGNOSIS — Z1231 Encounter for screening mammogram for malignant neoplasm of breast: Secondary | ICD-10-CM | POA: Diagnosis not present

## 2015-08-27 DIAGNOSIS — R300 Dysuria: Secondary | ICD-10-CM | POA: Diagnosis not present

## 2015-08-27 DIAGNOSIS — Z79899 Other long term (current) drug therapy: Secondary | ICD-10-CM | POA: Diagnosis not present

## 2015-08-27 DIAGNOSIS — R739 Hyperglycemia, unspecified: Secondary | ICD-10-CM | POA: Diagnosis not present

## 2015-08-27 DIAGNOSIS — Z6822 Body mass index (BMI) 22.0-22.9, adult: Secondary | ICD-10-CM | POA: Diagnosis not present

## 2015-08-27 DIAGNOSIS — E039 Hypothyroidism, unspecified: Secondary | ICD-10-CM | POA: Diagnosis not present

## 2015-08-27 DIAGNOSIS — G894 Chronic pain syndrome: Secondary | ICD-10-CM | POA: Diagnosis not present

## 2015-08-27 DIAGNOSIS — E785 Hyperlipidemia, unspecified: Secondary | ICD-10-CM | POA: Diagnosis not present

## 2015-09-04 DIAGNOSIS — N390 Urinary tract infection, site not specified: Secondary | ICD-10-CM | POA: Diagnosis not present

## 2015-09-04 DIAGNOSIS — R1084 Generalized abdominal pain: Secondary | ICD-10-CM | POA: Diagnosis not present

## 2015-09-04 DIAGNOSIS — R296 Repeated falls: Secondary | ICD-10-CM | POA: Diagnosis not present

## 2015-09-04 DIAGNOSIS — R531 Weakness: Secondary | ICD-10-CM | POA: Diagnosis not present

## 2015-09-04 DIAGNOSIS — G20 Parkinson's disease: Secondary | ICD-10-CM | POA: Diagnosis not present

## 2015-09-04 DIAGNOSIS — R55 Syncope and collapse: Secondary | ICD-10-CM | POA: Diagnosis not present

## 2015-09-04 DIAGNOSIS — S7001XA Contusion of right hip, initial encounter: Secondary | ICD-10-CM | POA: Diagnosis not present

## 2015-09-04 DIAGNOSIS — F0280 Dementia in other diseases classified elsewhere without behavioral disturbance: Secondary | ICD-10-CM | POA: Diagnosis not present

## 2015-09-04 DIAGNOSIS — S40011A Contusion of right shoulder, initial encounter: Secondary | ICD-10-CM | POA: Diagnosis not present

## 2015-09-04 DIAGNOSIS — R079 Chest pain, unspecified: Secondary | ICD-10-CM | POA: Diagnosis not present

## 2015-09-09 DIAGNOSIS — Z9181 History of falling: Secondary | ICD-10-CM | POA: Diagnosis not present

## 2015-09-09 DIAGNOSIS — G309 Alzheimer's disease, unspecified: Secondary | ICD-10-CM | POA: Diagnosis not present

## 2015-09-09 DIAGNOSIS — N390 Urinary tract infection, site not specified: Secondary | ICD-10-CM | POA: Diagnosis not present

## 2015-09-09 DIAGNOSIS — Z6822 Body mass index (BMI) 22.0-22.9, adult: Secondary | ICD-10-CM | POA: Diagnosis not present

## 2015-09-09 DIAGNOSIS — I10 Essential (primary) hypertension: Secondary | ICD-10-CM | POA: Diagnosis not present

## 2015-09-29 DIAGNOSIS — M1990 Unspecified osteoarthritis, unspecified site: Secondary | ICD-10-CM | POA: Diagnosis not present

## 2015-09-29 DIAGNOSIS — G309 Alzheimer's disease, unspecified: Secondary | ICD-10-CM | POA: Diagnosis not present

## 2015-09-29 DIAGNOSIS — I10 Essential (primary) hypertension: Secondary | ICD-10-CM | POA: Diagnosis not present

## 2015-09-29 DIAGNOSIS — Z79899 Other long term (current) drug therapy: Secondary | ICD-10-CM | POA: Diagnosis not present

## 2015-09-29 DIAGNOSIS — N3281 Overactive bladder: Secondary | ICD-10-CM | POA: Diagnosis not present

## 2015-09-29 DIAGNOSIS — Z6821 Body mass index (BMI) 21.0-21.9, adult: Secondary | ICD-10-CM | POA: Diagnosis not present

## 2015-10-28 DIAGNOSIS — F329 Major depressive disorder, single episode, unspecified: Secondary | ICD-10-CM | POA: Diagnosis not present

## 2015-10-28 DIAGNOSIS — Z6821 Body mass index (BMI) 21.0-21.9, adult: Secondary | ICD-10-CM | POA: Diagnosis not present

## 2015-10-28 DIAGNOSIS — B370 Candidal stomatitis: Secondary | ICD-10-CM | POA: Diagnosis not present

## 2015-10-28 DIAGNOSIS — R300 Dysuria: Secondary | ICD-10-CM | POA: Diagnosis not present

## 2015-10-28 DIAGNOSIS — M1990 Unspecified osteoarthritis, unspecified site: Secondary | ICD-10-CM | POA: Diagnosis not present

## 2015-10-28 DIAGNOSIS — G309 Alzheimer's disease, unspecified: Secondary | ICD-10-CM | POA: Diagnosis not present

## 2015-11-05 DIAGNOSIS — B370 Candidal stomatitis: Secondary | ICD-10-CM | POA: Diagnosis not present

## 2015-11-05 DIAGNOSIS — G20 Parkinson's disease: Secondary | ICD-10-CM | POA: Diagnosis not present

## 2015-11-05 DIAGNOSIS — N3010 Interstitial cystitis (chronic) without hematuria: Secondary | ICD-10-CM | POA: Diagnosis not present

## 2015-11-05 DIAGNOSIS — I10 Essential (primary) hypertension: Secondary | ICD-10-CM | POA: Diagnosis not present

## 2015-11-05 DIAGNOSIS — G301 Alzheimer's disease with late onset: Secondary | ICD-10-CM | POA: Diagnosis not present

## 2015-11-05 DIAGNOSIS — F419 Anxiety disorder, unspecified: Secondary | ICD-10-CM | POA: Diagnosis not present

## 2015-11-05 DIAGNOSIS — F329 Major depressive disorder, single episode, unspecified: Secondary | ICD-10-CM | POA: Diagnosis not present

## 2015-11-05 DIAGNOSIS — G894 Chronic pain syndrome: Secondary | ICD-10-CM | POA: Diagnosis not present

## 2015-11-05 DIAGNOSIS — F0280 Dementia in other diseases classified elsewhere without behavioral disturbance: Secondary | ICD-10-CM | POA: Diagnosis not present

## 2015-11-29 DIAGNOSIS — E039 Hypothyroidism, unspecified: Secondary | ICD-10-CM | POA: Diagnosis not present

## 2015-11-29 DIAGNOSIS — Z79899 Other long term (current) drug therapy: Secondary | ICD-10-CM | POA: Diagnosis not present

## 2015-11-29 DIAGNOSIS — E785 Hyperlipidemia, unspecified: Secondary | ICD-10-CM | POA: Diagnosis not present

## 2015-11-29 DIAGNOSIS — N390 Urinary tract infection, site not specified: Secondary | ICD-10-CM | POA: Diagnosis not present

## 2015-11-29 DIAGNOSIS — I10 Essential (primary) hypertension: Secondary | ICD-10-CM | POA: Diagnosis not present

## 2015-11-29 DIAGNOSIS — K219 Gastro-esophageal reflux disease without esophagitis: Secondary | ICD-10-CM | POA: Diagnosis not present

## 2015-11-29 DIAGNOSIS — G894 Chronic pain syndrome: Secondary | ICD-10-CM | POA: Diagnosis not present

## 2015-11-29 DIAGNOSIS — R739 Hyperglycemia, unspecified: Secondary | ICD-10-CM | POA: Diagnosis not present

## 2015-11-29 DIAGNOSIS — R110 Nausea: Secondary | ICD-10-CM | POA: Diagnosis not present

## 2015-11-29 DIAGNOSIS — Z1159 Encounter for screening for other viral diseases: Secondary | ICD-10-CM | POA: Diagnosis not present

## 2015-11-29 DIAGNOSIS — E559 Vitamin D deficiency, unspecified: Secondary | ICD-10-CM | POA: Diagnosis not present

## 2015-12-29 DIAGNOSIS — Z6821 Body mass index (BMI) 21.0-21.9, adult: Secondary | ICD-10-CM | POA: Diagnosis not present

## 2015-12-29 DIAGNOSIS — H539 Unspecified visual disturbance: Secondary | ICD-10-CM | POA: Diagnosis not present

## 2015-12-29 DIAGNOSIS — Z79899 Other long term (current) drug therapy: Secondary | ICD-10-CM | POA: Diagnosis not present

## 2015-12-29 DIAGNOSIS — Z23 Encounter for immunization: Secondary | ICD-10-CM | POA: Diagnosis not present

## 2015-12-29 DIAGNOSIS — N390 Urinary tract infection, site not specified: Secondary | ICD-10-CM | POA: Diagnosis not present

## 2015-12-29 DIAGNOSIS — E039 Hypothyroidism, unspecified: Secondary | ICD-10-CM | POA: Diagnosis not present

## 2015-12-29 DIAGNOSIS — E785 Hyperlipidemia, unspecified: Secondary | ICD-10-CM | POA: Diagnosis not present

## 2015-12-29 DIAGNOSIS — G894 Chronic pain syndrome: Secondary | ICD-10-CM | POA: Diagnosis not present

## 2016-01-27 DIAGNOSIS — G894 Chronic pain syndrome: Secondary | ICD-10-CM | POA: Diagnosis not present

## 2016-01-27 DIAGNOSIS — G309 Alzheimer's disease, unspecified: Secondary | ICD-10-CM | POA: Diagnosis not present

## 2016-01-27 DIAGNOSIS — Z79899 Other long term (current) drug therapy: Secondary | ICD-10-CM | POA: Diagnosis not present

## 2016-01-27 DIAGNOSIS — H3530 Unspecified macular degeneration: Secondary | ICD-10-CM | POA: Diagnosis not present

## 2016-01-27 DIAGNOSIS — Z6821 Body mass index (BMI) 21.0-21.9, adult: Secondary | ICD-10-CM | POA: Diagnosis not present

## 2016-02-02 DIAGNOSIS — H353211 Exudative age-related macular degeneration, right eye, with active choroidal neovascularization: Secondary | ICD-10-CM | POA: Diagnosis not present

## 2016-02-17 DIAGNOSIS — H353211 Exudative age-related macular degeneration, right eye, with active choroidal neovascularization: Secondary | ICD-10-CM | POA: Diagnosis not present

## 2016-02-22 DIAGNOSIS — Z111 Encounter for screening for respiratory tuberculosis: Secondary | ICD-10-CM | POA: Diagnosis not present

## 2016-02-25 DIAGNOSIS — Z6821 Body mass index (BMI) 21.0-21.9, adult: Secondary | ICD-10-CM | POA: Diagnosis not present

## 2016-02-25 DIAGNOSIS — G309 Alzheimer's disease, unspecified: Secondary | ICD-10-CM | POA: Diagnosis not present

## 2016-02-25 DIAGNOSIS — G894 Chronic pain syndrome: Secondary | ICD-10-CM | POA: Diagnosis not present

## 2016-03-28 DIAGNOSIS — Z79899 Other long term (current) drug therapy: Secondary | ICD-10-CM | POA: Diagnosis not present

## 2016-03-28 DIAGNOSIS — G309 Alzheimer's disease, unspecified: Secondary | ICD-10-CM | POA: Diagnosis not present

## 2016-03-28 DIAGNOSIS — N390 Urinary tract infection, site not specified: Secondary | ICD-10-CM | POA: Diagnosis not present

## 2016-03-28 DIAGNOSIS — Z6822 Body mass index (BMI) 22.0-22.9, adult: Secondary | ICD-10-CM | POA: Diagnosis not present

## 2016-03-28 DIAGNOSIS — G894 Chronic pain syndrome: Secondary | ICD-10-CM | POA: Diagnosis not present

## 2016-04-26 DIAGNOSIS — G309 Alzheimer's disease, unspecified: Secondary | ICD-10-CM | POA: Diagnosis not present

## 2016-04-26 DIAGNOSIS — Z6822 Body mass index (BMI) 22.0-22.9, adult: Secondary | ICD-10-CM | POA: Diagnosis not present

## 2016-04-26 DIAGNOSIS — G20 Parkinson's disease: Secondary | ICD-10-CM | POA: Diagnosis not present

## 2016-04-26 DIAGNOSIS — G894 Chronic pain syndrome: Secondary | ICD-10-CM | POA: Diagnosis not present

## 2016-04-27 DIAGNOSIS — H353211 Exudative age-related macular degeneration, right eye, with active choroidal neovascularization: Secondary | ICD-10-CM | POA: Diagnosis not present

## 2016-05-25 DIAGNOSIS — G894 Chronic pain syndrome: Secondary | ICD-10-CM | POA: Diagnosis not present

## 2016-05-25 DIAGNOSIS — G309 Alzheimer's disease, unspecified: Secondary | ICD-10-CM | POA: Diagnosis not present

## 2016-05-25 DIAGNOSIS — Z6822 Body mass index (BMI) 22.0-22.9, adult: Secondary | ICD-10-CM | POA: Diagnosis not present

## 2016-05-25 DIAGNOSIS — Z79899 Other long term (current) drug therapy: Secondary | ICD-10-CM | POA: Diagnosis not present

## 2016-05-25 DIAGNOSIS — G20 Parkinson's disease: Secondary | ICD-10-CM | POA: Diagnosis not present

## 2016-05-30 DIAGNOSIS — I10 Essential (primary) hypertension: Secondary | ICD-10-CM | POA: Diagnosis not present

## 2016-05-30 DIAGNOSIS — Z79899 Other long term (current) drug therapy: Secondary | ICD-10-CM | POA: Diagnosis not present

## 2016-05-30 DIAGNOSIS — E0590 Thyrotoxicosis, unspecified without thyrotoxic crisis or storm: Secondary | ICD-10-CM | POA: Diagnosis not present

## 2016-05-30 DIAGNOSIS — R451 Restlessness and agitation: Secondary | ICD-10-CM | POA: Diagnosis not present

## 2016-05-30 DIAGNOSIS — R079 Chest pain, unspecified: Secondary | ICD-10-CM | POA: Diagnosis not present

## 2016-05-30 DIAGNOSIS — G20 Parkinson's disease: Secondary | ICD-10-CM | POA: Diagnosis not present

## 2016-05-30 DIAGNOSIS — F99 Mental disorder, not otherwise specified: Secondary | ICD-10-CM | POA: Diagnosis not present

## 2016-05-30 DIAGNOSIS — F0280 Dementia in other diseases classified elsewhere without behavioral disturbance: Secondary | ICD-10-CM | POA: Diagnosis not present

## 2016-05-30 DIAGNOSIS — Z79891 Long term (current) use of opiate analgesic: Secondary | ICD-10-CM | POA: Diagnosis not present

## 2016-05-30 DIAGNOSIS — Z7982 Long term (current) use of aspirin: Secondary | ICD-10-CM | POA: Diagnosis not present

## 2016-06-02 DIAGNOSIS — I10 Essential (primary) hypertension: Secondary | ICD-10-CM | POA: Diagnosis not present

## 2016-06-02 DIAGNOSIS — F0280 Dementia in other diseases classified elsewhere without behavioral disturbance: Secondary | ICD-10-CM | POA: Diagnosis not present

## 2016-06-02 DIAGNOSIS — Z79891 Long term (current) use of opiate analgesic: Secondary | ICD-10-CM | POA: Diagnosis not present

## 2016-06-02 DIAGNOSIS — N390 Urinary tract infection, site not specified: Secondary | ICD-10-CM | POA: Diagnosis not present

## 2016-06-02 DIAGNOSIS — R451 Restlessness and agitation: Secondary | ICD-10-CM | POA: Diagnosis not present

## 2016-06-02 DIAGNOSIS — E0590 Thyrotoxicosis, unspecified without thyrotoxic crisis or storm: Secondary | ICD-10-CM | POA: Diagnosis not present

## 2016-06-02 DIAGNOSIS — G20 Parkinson's disease: Secondary | ICD-10-CM | POA: Diagnosis not present

## 2016-06-02 DIAGNOSIS — Z79899 Other long term (current) drug therapy: Secondary | ICD-10-CM | POA: Diagnosis not present

## 2016-06-02 DIAGNOSIS — Z7982 Long term (current) use of aspirin: Secondary | ICD-10-CM | POA: Diagnosis not present

## 2016-06-02 DIAGNOSIS — M5136 Other intervertebral disc degeneration, lumbar region: Secondary | ICD-10-CM | POA: Diagnosis not present

## 2016-06-02 DIAGNOSIS — G8929 Other chronic pain: Secondary | ICD-10-CM | POA: Diagnosis not present

## 2016-06-05 DIAGNOSIS — N390 Urinary tract infection, site not specified: Secondary | ICD-10-CM | POA: Diagnosis not present

## 2016-06-05 DIAGNOSIS — G8929 Other chronic pain: Secondary | ICD-10-CM | POA: Diagnosis not present

## 2016-06-05 DIAGNOSIS — G20 Parkinson's disease: Secondary | ICD-10-CM | POA: Diagnosis not present

## 2016-06-05 DIAGNOSIS — M5136 Other intervertebral disc degeneration, lumbar region: Secondary | ICD-10-CM | POA: Diagnosis not present

## 2016-06-15 DIAGNOSIS — G8929 Other chronic pain: Secondary | ICD-10-CM | POA: Diagnosis not present

## 2016-06-15 DIAGNOSIS — F0281 Dementia in other diseases classified elsewhere with behavioral disturbance: Secondary | ICD-10-CM | POA: Diagnosis not present

## 2016-06-15 DIAGNOSIS — I10 Essential (primary) hypertension: Secondary | ICD-10-CM | POA: Diagnosis not present

## 2016-06-15 DIAGNOSIS — G20 Parkinson's disease: Secondary | ICD-10-CM | POA: Diagnosis not present

## 2016-06-19 DIAGNOSIS — G8929 Other chronic pain: Secondary | ICD-10-CM | POA: Diagnosis not present

## 2016-06-19 DIAGNOSIS — G20 Parkinson's disease: Secondary | ICD-10-CM | POA: Diagnosis not present

## 2016-06-19 DIAGNOSIS — N390 Urinary tract infection, site not specified: Secondary | ICD-10-CM | POA: Diagnosis not present

## 2016-06-19 DIAGNOSIS — F0281 Dementia in other diseases classified elsewhere with behavioral disturbance: Secondary | ICD-10-CM | POA: Diagnosis not present

## 2016-06-23 DIAGNOSIS — G309 Alzheimer's disease, unspecified: Secondary | ICD-10-CM | POA: Diagnosis not present

## 2016-06-23 DIAGNOSIS — E039 Hypothyroidism, unspecified: Secondary | ICD-10-CM | POA: Diagnosis not present

## 2016-06-23 DIAGNOSIS — Z79899 Other long term (current) drug therapy: Secondary | ICD-10-CM | POA: Diagnosis not present

## 2016-06-23 DIAGNOSIS — Z6823 Body mass index (BMI) 23.0-23.9, adult: Secondary | ICD-10-CM | POA: Diagnosis not present

## 2016-06-23 DIAGNOSIS — E785 Hyperlipidemia, unspecified: Secondary | ICD-10-CM | POA: Diagnosis not present

## 2016-06-23 DIAGNOSIS — E559 Vitamin D deficiency, unspecified: Secondary | ICD-10-CM | POA: Diagnosis not present

## 2016-06-23 DIAGNOSIS — G894 Chronic pain syndrome: Secondary | ICD-10-CM | POA: Diagnosis not present

## 2016-06-23 DIAGNOSIS — R739 Hyperglycemia, unspecified: Secondary | ICD-10-CM | POA: Diagnosis not present

## 2016-06-28 ENCOUNTER — Other Ambulatory Visit: Payer: Self-pay | Admitting: *Deleted

## 2016-06-28 NOTE — Patient Outreach (Signed)
[facility]) Care Management  06/28/2016  Jessica Hayes 1946-01-10 [id number]   Contact with Jessica Hayes at facility regarding patient. She reveals that patient left AMA home with her daughter. RNCM requested  Jessica Hayes fax medical history for [facility] care management screening.  Jessica Hayes. Jessica Ghee, RN, BSN, CCM  Post Acute Chartered loss adjuster [phone]) Business Cell  [phone]) Toll Free Office

## 2016-07-03 ENCOUNTER — Other Ambulatory Visit: Payer: Self-pay | Admitting: *Deleted

## 2016-07-03 ENCOUNTER — Encounter: Payer: Self-pay | Admitting: *Deleted

## 2016-07-03 NOTE — Patient Outreach (Signed)
Communication with [facility], discharge planner. She states patient needed discharged 06/20/16 home with daughter.  Call to patient, spoke with daughter. She states daughter is doing well since being home.  RNCM reviewed [facility] care management program.  She would like more information to review.  Plan to mail letter with brochure, explained that [facility] contact on brochure for future reference.  Daughter agrees to contact [facility] with future needs.  Will sign off for now.  Alben Spittle. Albertha Ghee, RN, BSN, CCM  Post Acute Chartered loss adjuster [phone]) Business Cell  [phone]) Toll Free Office

## 2016-07-13 DIAGNOSIS — H353211 Exudative age-related macular degeneration, right eye, with active choroidal neovascularization: Secondary | ICD-10-CM | POA: Diagnosis not present

## 2016-07-26 DIAGNOSIS — Z79899 Other long term (current) drug therapy: Secondary | ICD-10-CM | POA: Diagnosis not present

## 2016-07-26 DIAGNOSIS — Z139 Encounter for screening, unspecified: Secondary | ICD-10-CM | POA: Diagnosis not present

## 2016-07-26 DIAGNOSIS — G309 Alzheimer's disease, unspecified: Secondary | ICD-10-CM | POA: Diagnosis not present

## 2016-07-26 DIAGNOSIS — Z1389 Encounter for screening for other disorder: Secondary | ICD-10-CM | POA: Diagnosis not present

## 2016-07-26 DIAGNOSIS — G894 Chronic pain syndrome: Secondary | ICD-10-CM | POA: Diagnosis not present

## 2016-07-26 DIAGNOSIS — Z6824 Body mass index (BMI) 24.0-24.9, adult: Secondary | ICD-10-CM | POA: Diagnosis not present

## 2016-09-06 DIAGNOSIS — G894 Chronic pain syndrome: Secondary | ICD-10-CM | POA: Diagnosis not present

## 2016-09-06 DIAGNOSIS — Z6824 Body mass index (BMI) 24.0-24.9, adult: Secondary | ICD-10-CM | POA: Diagnosis not present

## 2016-09-06 DIAGNOSIS — R300 Dysuria: Secondary | ICD-10-CM | POA: Diagnosis not present

## 2016-09-06 DIAGNOSIS — Z79899 Other long term (current) drug therapy: Secondary | ICD-10-CM | POA: Diagnosis not present

## 2016-09-06 DIAGNOSIS — K0889 Other specified disorders of teeth and supporting structures: Secondary | ICD-10-CM | POA: Diagnosis not present

## 2016-09-06 DIAGNOSIS — N3281 Overactive bladder: Secondary | ICD-10-CM | POA: Diagnosis not present

## 2016-10-06 DIAGNOSIS — Z79899 Other long term (current) drug therapy: Secondary | ICD-10-CM | POA: Diagnosis not present

## 2016-10-06 DIAGNOSIS — I10 Essential (primary) hypertension: Secondary | ICD-10-CM | POA: Diagnosis not present

## 2016-10-06 DIAGNOSIS — K047 Periapical abscess without sinus: Secondary | ICD-10-CM | POA: Diagnosis not present

## 2016-10-06 DIAGNOSIS — G309 Alzheimer's disease, unspecified: Secondary | ICD-10-CM | POA: Diagnosis not present

## 2016-10-06 DIAGNOSIS — Z6824 Body mass index (BMI) 24.0-24.9, adult: Secondary | ICD-10-CM | POA: Diagnosis not present

## 2016-10-06 DIAGNOSIS — G894 Chronic pain syndrome: Secondary | ICD-10-CM | POA: Diagnosis not present

## 2016-10-06 DIAGNOSIS — G20 Parkinson's disease: Secondary | ICD-10-CM | POA: Diagnosis not present

## 2016-10-06 DIAGNOSIS — F329 Major depressive disorder, single episode, unspecified: Secondary | ICD-10-CM | POA: Diagnosis not present

## 2016-11-29 DIAGNOSIS — Z7982 Long term (current) use of aspirin: Secondary | ICD-10-CM | POA: Diagnosis not present

## 2016-11-29 DIAGNOSIS — G8929 Other chronic pain: Secondary | ICD-10-CM | POA: Diagnosis not present

## 2016-11-29 DIAGNOSIS — Z79899 Other long term (current) drug therapy: Secondary | ICD-10-CM | POA: Diagnosis not present

## 2016-11-29 DIAGNOSIS — R300 Dysuria: Secondary | ICD-10-CM | POA: Diagnosis not present

## 2016-11-29 DIAGNOSIS — F0280 Dementia in other diseases classified elsewhere without behavioral disturbance: Secondary | ICD-10-CM | POA: Diagnosis not present

## 2016-11-29 DIAGNOSIS — G309 Alzheimer's disease, unspecified: Secondary | ICD-10-CM | POA: Diagnosis not present

## 2016-11-29 DIAGNOSIS — G20 Parkinson's disease: Secondary | ICD-10-CM | POA: Diagnosis not present

## 2016-11-29 DIAGNOSIS — N3000 Acute cystitis without hematuria: Secondary | ICD-10-CM | POA: Diagnosis not present

## 2016-12-24 DIAGNOSIS — Z79899 Other long term (current) drug therapy: Secondary | ICD-10-CM | POA: Diagnosis not present

## 2016-12-24 DIAGNOSIS — F329 Major depressive disorder, single episode, unspecified: Secondary | ICD-10-CM | POA: Diagnosis not present

## 2016-12-24 DIAGNOSIS — R4589 Other symptoms and signs involving emotional state: Secondary | ICD-10-CM | POA: Diagnosis not present

## 2016-12-24 DIAGNOSIS — R45851 Suicidal ideations: Secondary | ICD-10-CM | POA: Diagnosis not present

## 2016-12-24 DIAGNOSIS — F0390 Unspecified dementia without behavioral disturbance: Secondary | ICD-10-CM | POA: Diagnosis not present

## 2016-12-24 DIAGNOSIS — F333 Major depressive disorder, recurrent, severe with psychotic symptoms: Secondary | ICD-10-CM | POA: Diagnosis not present

## 2016-12-24 DIAGNOSIS — F23 Brief psychotic disorder: Secondary | ICD-10-CM | POA: Diagnosis not present

## 2016-12-25 DIAGNOSIS — Z9114 Patient's other noncompliance with medication regimen: Secondary | ICD-10-CM | POA: Diagnosis not present

## 2016-12-25 DIAGNOSIS — F0390 Unspecified dementia without behavioral disturbance: Secondary | ICD-10-CM | POA: Diagnosis not present

## 2016-12-25 DIAGNOSIS — F333 Major depressive disorder, recurrent, severe with psychotic symptoms: Secondary | ICD-10-CM | POA: Diagnosis not present

## 2016-12-25 DIAGNOSIS — R443 Hallucinations, unspecified: Secondary | ICD-10-CM | POA: Diagnosis not present

## 2016-12-25 DIAGNOSIS — G309 Alzheimer's disease, unspecified: Secondary | ICD-10-CM | POA: Diagnosis not present

## 2016-12-25 DIAGNOSIS — F329 Major depressive disorder, single episode, unspecified: Secondary | ICD-10-CM | POA: Diagnosis not present

## 2016-12-25 DIAGNOSIS — E7800 Pure hypercholesterolemia, unspecified: Secondary | ICD-10-CM | POA: Diagnosis not present

## 2016-12-25 DIAGNOSIS — R45851 Suicidal ideations: Secondary | ICD-10-CM | POA: Diagnosis not present

## 2016-12-25 DIAGNOSIS — G20 Parkinson's disease: Secondary | ICD-10-CM | POA: Diagnosis not present

## 2016-12-25 DIAGNOSIS — N390 Urinary tract infection, site not specified: Secondary | ICD-10-CM | POA: Diagnosis not present

## 2016-12-25 DIAGNOSIS — M1990 Unspecified osteoarthritis, unspecified site: Secondary | ICD-10-CM | POA: Diagnosis not present

## 2016-12-25 DIAGNOSIS — F419 Anxiety disorder, unspecified: Secondary | ICD-10-CM | POA: Diagnosis not present

## 2016-12-25 DIAGNOSIS — R531 Weakness: Secondary | ICD-10-CM | POA: Diagnosis not present

## 2016-12-25 DIAGNOSIS — F332 Major depressive disorder, recurrent severe without psychotic features: Secondary | ICD-10-CM | POA: Diagnosis not present

## 2016-12-25 DIAGNOSIS — Z79899 Other long term (current) drug therapy: Secondary | ICD-10-CM | POA: Diagnosis not present

## 2016-12-25 DIAGNOSIS — R296 Repeated falls: Secondary | ICD-10-CM | POA: Diagnosis not present

## 2016-12-25 DIAGNOSIS — R29810 Facial weakness: Secondary | ICD-10-CM | POA: Diagnosis not present

## 2016-12-25 DIAGNOSIS — F0281 Dementia in other diseases classified elsewhere with behavioral disturbance: Secondary | ICD-10-CM | POA: Diagnosis not present

## 2016-12-25 DIAGNOSIS — R251 Tremor, unspecified: Secondary | ICD-10-CM | POA: Diagnosis not present

## 2016-12-25 DIAGNOSIS — I10 Essential (primary) hypertension: Secondary | ICD-10-CM | POA: Diagnosis not present

## 2016-12-25 DIAGNOSIS — R2681 Unsteadiness on feet: Secondary | ICD-10-CM | POA: Diagnosis not present

## 2016-12-25 DIAGNOSIS — G301 Alzheimer's disease with late onset: Secondary | ICD-10-CM | POA: Diagnosis not present

## 2017-01-09 DIAGNOSIS — R296 Repeated falls: Secondary | ICD-10-CM | POA: Diagnosis not present

## 2017-01-09 DIAGNOSIS — R29810 Facial weakness: Secondary | ICD-10-CM | POA: Diagnosis not present

## 2017-01-09 DIAGNOSIS — G301 Alzheimer's disease with late onset: Secondary | ICD-10-CM | POA: Diagnosis not present

## 2017-01-09 DIAGNOSIS — R419 Unspecified symptoms and signs involving cognitive functions and awareness: Secondary | ICD-10-CM | POA: Diagnosis not present

## 2017-01-09 DIAGNOSIS — F0281 Dementia in other diseases classified elsewhere with behavioral disturbance: Secondary | ICD-10-CM | POA: Diagnosis not present

## 2017-01-09 DIAGNOSIS — F333 Major depressive disorder, recurrent, severe with psychotic symptoms: Secondary | ICD-10-CM | POA: Diagnosis not present

## 2017-01-09 DIAGNOSIS — N390 Urinary tract infection, site not specified: Secondary | ICD-10-CM | POA: Diagnosis not present

## 2017-01-09 DIAGNOSIS — R45851 Suicidal ideations: Secondary | ICD-10-CM | POA: Diagnosis not present

## 2017-01-09 DIAGNOSIS — F329 Major depressive disorder, single episode, unspecified: Secondary | ICD-10-CM | POA: Diagnosis not present

## 2017-01-09 DIAGNOSIS — G20 Parkinson's disease: Secondary | ICD-10-CM | POA: Diagnosis not present

## 2017-01-09 DIAGNOSIS — Z9114 Patient's other noncompliance with medication regimen: Secondary | ICD-10-CM | POA: Diagnosis not present

## 2017-01-09 DIAGNOSIS — F419 Anxiety disorder, unspecified: Secondary | ICD-10-CM | POA: Diagnosis not present

## 2017-01-09 DIAGNOSIS — G3184 Mild cognitive impairment, so stated: Secondary | ICD-10-CM | POA: Diagnosis not present

## 2017-01-09 DIAGNOSIS — F332 Major depressive disorder, recurrent severe without psychotic features: Secondary | ICD-10-CM | POA: Diagnosis not present

## 2017-01-09 DIAGNOSIS — G309 Alzheimer's disease, unspecified: Secondary | ICD-10-CM | POA: Diagnosis not present

## 2017-01-09 DIAGNOSIS — R531 Weakness: Secondary | ICD-10-CM | POA: Diagnosis not present

## 2017-01-09 DIAGNOSIS — R4189 Other symptoms and signs involving cognitive functions and awareness: Secondary | ICD-10-CM | POA: Diagnosis not present

## 2017-01-10 DIAGNOSIS — F0281 Dementia in other diseases classified elsewhere with behavioral disturbance: Secondary | ICD-10-CM | POA: Diagnosis not present

## 2017-01-10 DIAGNOSIS — F332 Major depressive disorder, recurrent severe without psychotic features: Secondary | ICD-10-CM | POA: Diagnosis not present

## 2017-01-11 DIAGNOSIS — F332 Major depressive disorder, recurrent severe without psychotic features: Secondary | ICD-10-CM | POA: Diagnosis not present

## 2017-01-11 DIAGNOSIS — F0281 Dementia in other diseases classified elsewhere with behavioral disturbance: Secondary | ICD-10-CM | POA: Diagnosis not present

## 2017-01-12 DIAGNOSIS — F0281 Dementia in other diseases classified elsewhere with behavioral disturbance: Secondary | ICD-10-CM | POA: Diagnosis not present

## 2017-01-12 DIAGNOSIS — F332 Major depressive disorder, recurrent severe without psychotic features: Secondary | ICD-10-CM | POA: Diagnosis not present

## 2017-01-13 DIAGNOSIS — G301 Alzheimer's disease with late onset: Secondary | ICD-10-CM | POA: Diagnosis not present

## 2017-01-13 DIAGNOSIS — F333 Major depressive disorder, recurrent, severe with psychotic symptoms: Secondary | ICD-10-CM | POA: Diagnosis not present

## 2017-01-14 DIAGNOSIS — G301 Alzheimer's disease with late onset: Secondary | ICD-10-CM | POA: Diagnosis not present

## 2017-01-14 DIAGNOSIS — F333 Major depressive disorder, recurrent, severe with psychotic symptoms: Secondary | ICD-10-CM | POA: Diagnosis not present

## 2017-01-15 DIAGNOSIS — F332 Major depressive disorder, recurrent severe without psychotic features: Secondary | ICD-10-CM | POA: Diagnosis not present

## 2017-01-15 DIAGNOSIS — F0281 Dementia in other diseases classified elsewhere with behavioral disturbance: Secondary | ICD-10-CM | POA: Diagnosis not present

## 2017-01-16 DIAGNOSIS — F0281 Dementia in other diseases classified elsewhere with behavioral disturbance: Secondary | ICD-10-CM | POA: Diagnosis not present

## 2017-01-16 DIAGNOSIS — F332 Major depressive disorder, recurrent severe without psychotic features: Secondary | ICD-10-CM | POA: Diagnosis not present

## 2017-01-17 DIAGNOSIS — F0281 Dementia in other diseases classified elsewhere with behavioral disturbance: Secondary | ICD-10-CM | POA: Diagnosis not present

## 2017-01-17 DIAGNOSIS — F332 Major depressive disorder, recurrent severe without psychotic features: Secondary | ICD-10-CM | POA: Diagnosis not present

## 2017-01-18 DIAGNOSIS — F0281 Dementia in other diseases classified elsewhere with behavioral disturbance: Secondary | ICD-10-CM | POA: Diagnosis not present

## 2017-01-18 DIAGNOSIS — F332 Major depressive disorder, recurrent severe without psychotic features: Secondary | ICD-10-CM | POA: Diagnosis not present

## 2017-01-19 DIAGNOSIS — F332 Major depressive disorder, recurrent severe without psychotic features: Secondary | ICD-10-CM | POA: Diagnosis not present

## 2017-01-19 DIAGNOSIS — F0281 Dementia in other diseases classified elsewhere with behavioral disturbance: Secondary | ICD-10-CM | POA: Diagnosis not present

## 2017-01-20 DIAGNOSIS — F0281 Dementia in other diseases classified elsewhere with behavioral disturbance: Secondary | ICD-10-CM | POA: Diagnosis not present

## 2017-01-20 DIAGNOSIS — F332 Major depressive disorder, recurrent severe without psychotic features: Secondary | ICD-10-CM | POA: Diagnosis not present

## 2017-01-21 DIAGNOSIS — F0281 Dementia in other diseases classified elsewhere with behavioral disturbance: Secondary | ICD-10-CM | POA: Diagnosis not present

## 2017-01-21 DIAGNOSIS — F332 Major depressive disorder, recurrent severe without psychotic features: Secondary | ICD-10-CM | POA: Diagnosis not present

## 2017-01-22 DIAGNOSIS — F0281 Dementia in other diseases classified elsewhere with behavioral disturbance: Secondary | ICD-10-CM | POA: Diagnosis not present

## 2017-01-22 DIAGNOSIS — F332 Major depressive disorder, recurrent severe without psychotic features: Secondary | ICD-10-CM | POA: Diagnosis not present

## 2017-01-23 DIAGNOSIS — F332 Major depressive disorder, recurrent severe without psychotic features: Secondary | ICD-10-CM | POA: Diagnosis not present

## 2017-01-23 DIAGNOSIS — F0281 Dementia in other diseases classified elsewhere with behavioral disturbance: Secondary | ICD-10-CM | POA: Diagnosis not present

## 2017-01-24 DIAGNOSIS — F332 Major depressive disorder, recurrent severe without psychotic features: Secondary | ICD-10-CM | POA: Diagnosis not present

## 2017-01-24 DIAGNOSIS — F0281 Dementia in other diseases classified elsewhere with behavioral disturbance: Secondary | ICD-10-CM | POA: Diagnosis not present

## 2017-01-25 DIAGNOSIS — F332 Major depressive disorder, recurrent severe without psychotic features: Secondary | ICD-10-CM | POA: Diagnosis not present

## 2017-01-25 DIAGNOSIS — F0281 Dementia in other diseases classified elsewhere with behavioral disturbance: Secondary | ICD-10-CM | POA: Diagnosis not present

## 2017-01-26 DIAGNOSIS — F332 Major depressive disorder, recurrent severe without psychotic features: Secondary | ICD-10-CM | POA: Diagnosis not present

## 2017-01-26 DIAGNOSIS — F0281 Dementia in other diseases classified elsewhere with behavioral disturbance: Secondary | ICD-10-CM | POA: Diagnosis not present

## 2017-01-27 DIAGNOSIS — F0281 Dementia in other diseases classified elsewhere with behavioral disturbance: Secondary | ICD-10-CM | POA: Diagnosis not present

## 2017-01-27 DIAGNOSIS — G301 Alzheimer's disease with late onset: Secondary | ICD-10-CM | POA: Diagnosis not present

## 2017-01-28 DIAGNOSIS — F0281 Dementia in other diseases classified elsewhere with behavioral disturbance: Secondary | ICD-10-CM | POA: Diagnosis not present

## 2017-01-28 DIAGNOSIS — G301 Alzheimer's disease with late onset: Secondary | ICD-10-CM | POA: Diagnosis not present

## 2017-01-29 DIAGNOSIS — F0281 Dementia in other diseases classified elsewhere with behavioral disturbance: Secondary | ICD-10-CM | POA: Diagnosis not present

## 2017-01-29 DIAGNOSIS — F332 Major depressive disorder, recurrent severe without psychotic features: Secondary | ICD-10-CM | POA: Diagnosis not present

## 2017-01-30 DIAGNOSIS — F332 Major depressive disorder, recurrent severe without psychotic features: Secondary | ICD-10-CM | POA: Diagnosis not present

## 2017-01-30 DIAGNOSIS — F0281 Dementia in other diseases classified elsewhere with behavioral disturbance: Secondary | ICD-10-CM | POA: Diagnosis not present

## 2017-01-31 DIAGNOSIS — F0281 Dementia in other diseases classified elsewhere with behavioral disturbance: Secondary | ICD-10-CM | POA: Diagnosis not present

## 2017-01-31 DIAGNOSIS — F332 Major depressive disorder, recurrent severe without psychotic features: Secondary | ICD-10-CM | POA: Diagnosis not present

## 2017-02-01 DIAGNOSIS — F0281 Dementia in other diseases classified elsewhere with behavioral disturbance: Secondary | ICD-10-CM | POA: Diagnosis not present

## 2017-02-01 DIAGNOSIS — F332 Major depressive disorder, recurrent severe without psychotic features: Secondary | ICD-10-CM | POA: Diagnosis not present

## 2017-02-02 DIAGNOSIS — F0281 Dementia in other diseases classified elsewhere with behavioral disturbance: Secondary | ICD-10-CM | POA: Diagnosis not present

## 2017-02-02 DIAGNOSIS — F332 Major depressive disorder, recurrent severe without psychotic features: Secondary | ICD-10-CM | POA: Diagnosis not present

## 2017-02-03 DIAGNOSIS — F332 Major depressive disorder, recurrent severe without psychotic features: Secondary | ICD-10-CM | POA: Diagnosis not present

## 2017-02-03 DIAGNOSIS — F0281 Dementia in other diseases classified elsewhere with behavioral disturbance: Secondary | ICD-10-CM | POA: Diagnosis not present

## 2017-02-04 DIAGNOSIS — F0281 Dementia in other diseases classified elsewhere with behavioral disturbance: Secondary | ICD-10-CM | POA: Diagnosis not present

## 2017-02-04 DIAGNOSIS — F332 Major depressive disorder, recurrent severe without psychotic features: Secondary | ICD-10-CM | POA: Diagnosis not present

## 2017-02-05 DIAGNOSIS — F0281 Dementia in other diseases classified elsewhere with behavioral disturbance: Secondary | ICD-10-CM | POA: Diagnosis not present

## 2017-02-05 DIAGNOSIS — F332 Major depressive disorder, recurrent severe without psychotic features: Secondary | ICD-10-CM | POA: Diagnosis not present

## 2017-02-06 DIAGNOSIS — F0281 Dementia in other diseases classified elsewhere with behavioral disturbance: Secondary | ICD-10-CM | POA: Diagnosis not present

## 2017-02-06 DIAGNOSIS — F332 Major depressive disorder, recurrent severe without psychotic features: Secondary | ICD-10-CM | POA: Diagnosis not present

## 2017-02-07 DIAGNOSIS — R4189 Other symptoms and signs involving cognitive functions and awareness: Secondary | ICD-10-CM | POA: Diagnosis not present

## 2017-02-07 DIAGNOSIS — F329 Major depressive disorder, single episode, unspecified: Secondary | ICD-10-CM | POA: Diagnosis not present

## 2017-02-08 DIAGNOSIS — G20 Parkinson's disease: Secondary | ICD-10-CM | POA: Diagnosis not present

## 2017-02-08 DIAGNOSIS — F333 Major depressive disorder, recurrent, severe with psychotic symptoms: Secondary | ICD-10-CM | POA: Diagnosis not present

## 2017-02-08 DIAGNOSIS — R419 Unspecified symptoms and signs involving cognitive functions and awareness: Secondary | ICD-10-CM | POA: Diagnosis not present

## 2017-02-12 DIAGNOSIS — F332 Major depressive disorder, recurrent severe without psychotic features: Secondary | ICD-10-CM | POA: Diagnosis not present

## 2017-02-12 DIAGNOSIS — F0281 Dementia in other diseases classified elsewhere with behavioral disturbance: Secondary | ICD-10-CM | POA: Diagnosis not present

## 2017-02-13 DIAGNOSIS — F0281 Dementia in other diseases classified elsewhere with behavioral disturbance: Secondary | ICD-10-CM | POA: Diagnosis not present

## 2017-02-13 DIAGNOSIS — F332 Major depressive disorder, recurrent severe without psychotic features: Secondary | ICD-10-CM | POA: Diagnosis not present

## 2017-02-14 DIAGNOSIS — F332 Major depressive disorder, recurrent severe without psychotic features: Secondary | ICD-10-CM | POA: Diagnosis not present

## 2017-02-14 DIAGNOSIS — F0281 Dementia in other diseases classified elsewhere with behavioral disturbance: Secondary | ICD-10-CM | POA: Diagnosis not present

## 2017-02-15 DIAGNOSIS — F332 Major depressive disorder, recurrent severe without psychotic features: Secondary | ICD-10-CM | POA: Diagnosis not present

## 2017-02-15 DIAGNOSIS — F0281 Dementia in other diseases classified elsewhere with behavioral disturbance: Secondary | ICD-10-CM | POA: Diagnosis not present

## 2017-02-16 DIAGNOSIS — F0281 Dementia in other diseases classified elsewhere with behavioral disturbance: Secondary | ICD-10-CM | POA: Diagnosis not present

## 2017-02-16 DIAGNOSIS — F332 Major depressive disorder, recurrent severe without psychotic features: Secondary | ICD-10-CM | POA: Diagnosis not present

## 2017-02-17 DIAGNOSIS — F332 Major depressive disorder, recurrent severe without psychotic features: Secondary | ICD-10-CM | POA: Diagnosis not present

## 2017-02-17 DIAGNOSIS — F0281 Dementia in other diseases classified elsewhere with behavioral disturbance: Secondary | ICD-10-CM | POA: Diagnosis not present

## 2017-02-18 DIAGNOSIS — F332 Major depressive disorder, recurrent severe without psychotic features: Secondary | ICD-10-CM | POA: Diagnosis not present

## 2017-02-18 DIAGNOSIS — F0281 Dementia in other diseases classified elsewhere with behavioral disturbance: Secondary | ICD-10-CM | POA: Diagnosis not present

## 2017-02-19 DIAGNOSIS — F0281 Dementia in other diseases classified elsewhere with behavioral disturbance: Secondary | ICD-10-CM | POA: Diagnosis not present

## 2017-02-19 DIAGNOSIS — F332 Major depressive disorder, recurrent severe without psychotic features: Secondary | ICD-10-CM | POA: Diagnosis not present

## 2017-02-20 DIAGNOSIS — F332 Major depressive disorder, recurrent severe without psychotic features: Secondary | ICD-10-CM | POA: Diagnosis not present

## 2017-02-20 DIAGNOSIS — F0281 Dementia in other diseases classified elsewhere with behavioral disturbance: Secondary | ICD-10-CM | POA: Diagnosis not present

## 2017-02-21 DIAGNOSIS — F0281 Dementia in other diseases classified elsewhere with behavioral disturbance: Secondary | ICD-10-CM | POA: Diagnosis not present

## 2017-02-21 DIAGNOSIS — F332 Major depressive disorder, recurrent severe without psychotic features: Secondary | ICD-10-CM | POA: Diagnosis not present

## 2017-02-22 DIAGNOSIS — F332 Major depressive disorder, recurrent severe without psychotic features: Secondary | ICD-10-CM | POA: Diagnosis not present

## 2017-02-22 DIAGNOSIS — F0281 Dementia in other diseases classified elsewhere with behavioral disturbance: Secondary | ICD-10-CM | POA: Diagnosis not present

## 2017-02-23 DIAGNOSIS — F332 Major depressive disorder, recurrent severe without psychotic features: Secondary | ICD-10-CM | POA: Diagnosis not present

## 2017-02-23 DIAGNOSIS — F0281 Dementia in other diseases classified elsewhere with behavioral disturbance: Secondary | ICD-10-CM | POA: Diagnosis not present

## 2017-02-24 DIAGNOSIS — F0281 Dementia in other diseases classified elsewhere with behavioral disturbance: Secondary | ICD-10-CM | POA: Diagnosis not present

## 2017-02-24 DIAGNOSIS — F332 Major depressive disorder, recurrent severe without psychotic features: Secondary | ICD-10-CM | POA: Diagnosis not present

## 2017-02-25 DIAGNOSIS — F332 Major depressive disorder, recurrent severe without psychotic features: Secondary | ICD-10-CM | POA: Diagnosis not present

## 2017-02-25 DIAGNOSIS — F0281 Dementia in other diseases classified elsewhere with behavioral disturbance: Secondary | ICD-10-CM | POA: Diagnosis not present

## 2017-02-26 DIAGNOSIS — F0281 Dementia in other diseases classified elsewhere with behavioral disturbance: Secondary | ICD-10-CM | POA: Diagnosis not present

## 2017-02-26 DIAGNOSIS — F332 Major depressive disorder, recurrent severe without psychotic features: Secondary | ICD-10-CM | POA: Diagnosis not present

## 2017-02-27 DIAGNOSIS — F0281 Dementia in other diseases classified elsewhere with behavioral disturbance: Secondary | ICD-10-CM | POA: Diagnosis not present

## 2017-02-27 DIAGNOSIS — F332 Major depressive disorder, recurrent severe without psychotic features: Secondary | ICD-10-CM | POA: Diagnosis not present

## 2017-02-28 DIAGNOSIS — F332 Major depressive disorder, recurrent severe without psychotic features: Secondary | ICD-10-CM | POA: Diagnosis not present

## 2017-02-28 DIAGNOSIS — F0281 Dementia in other diseases classified elsewhere with behavioral disturbance: Secondary | ICD-10-CM | POA: Diagnosis not present

## 2017-03-01 DIAGNOSIS — F332 Major depressive disorder, recurrent severe without psychotic features: Secondary | ICD-10-CM | POA: Diagnosis not present

## 2017-03-01 DIAGNOSIS — F0281 Dementia in other diseases classified elsewhere with behavioral disturbance: Secondary | ICD-10-CM | POA: Diagnosis not present

## 2017-03-02 DIAGNOSIS — F0281 Dementia in other diseases classified elsewhere with behavioral disturbance: Secondary | ICD-10-CM | POA: Diagnosis not present

## 2017-03-02 DIAGNOSIS — F332 Major depressive disorder, recurrent severe without psychotic features: Secondary | ICD-10-CM | POA: Diagnosis not present

## 2017-03-05 DIAGNOSIS — F332 Major depressive disorder, recurrent severe without psychotic features: Secondary | ICD-10-CM | POA: Diagnosis not present

## 2017-03-05 DIAGNOSIS — F0281 Dementia in other diseases classified elsewhere with behavioral disturbance: Secondary | ICD-10-CM | POA: Diagnosis not present

## 2017-03-06 DIAGNOSIS — F332 Major depressive disorder, recurrent severe without psychotic features: Secondary | ICD-10-CM | POA: Diagnosis not present

## 2017-03-06 DIAGNOSIS — F0281 Dementia in other diseases classified elsewhere with behavioral disturbance: Secondary | ICD-10-CM | POA: Diagnosis not present

## 2017-03-07 DIAGNOSIS — F332 Major depressive disorder, recurrent severe without psychotic features: Secondary | ICD-10-CM | POA: Diagnosis not present

## 2017-03-07 DIAGNOSIS — F0281 Dementia in other diseases classified elsewhere with behavioral disturbance: Secondary | ICD-10-CM | POA: Diagnosis not present

## 2017-03-08 DIAGNOSIS — F332 Major depressive disorder, recurrent severe without psychotic features: Secondary | ICD-10-CM | POA: Diagnosis not present

## 2017-03-08 DIAGNOSIS — F0281 Dementia in other diseases classified elsewhere with behavioral disturbance: Secondary | ICD-10-CM | POA: Diagnosis not present

## 2017-03-09 DIAGNOSIS — F0281 Dementia in other diseases classified elsewhere with behavioral disturbance: Secondary | ICD-10-CM | POA: Diagnosis not present

## 2017-03-09 DIAGNOSIS — F332 Major depressive disorder, recurrent severe without psychotic features: Secondary | ICD-10-CM | POA: Diagnosis not present

## 2017-03-10 DIAGNOSIS — F333 Major depressive disorder, recurrent, severe with psychotic symptoms: Secondary | ICD-10-CM | POA: Diagnosis not present

## 2017-03-10 DIAGNOSIS — G301 Alzheimer's disease with late onset: Secondary | ICD-10-CM | POA: Diagnosis not present

## 2017-03-11 DIAGNOSIS — F333 Major depressive disorder, recurrent, severe with psychotic symptoms: Secondary | ICD-10-CM | POA: Diagnosis not present

## 2017-03-11 DIAGNOSIS — G301 Alzheimer's disease with late onset: Secondary | ICD-10-CM | POA: Diagnosis not present

## 2017-03-12 DIAGNOSIS — F333 Major depressive disorder, recurrent, severe with psychotic symptoms: Secondary | ICD-10-CM | POA: Diagnosis not present

## 2017-03-12 DIAGNOSIS — G301 Alzheimer's disease with late onset: Secondary | ICD-10-CM | POA: Diagnosis not present

## 2017-03-13 DIAGNOSIS — F333 Major depressive disorder, recurrent, severe with psychotic symptoms: Secondary | ICD-10-CM | POA: Diagnosis not present

## 2017-03-13 DIAGNOSIS — G301 Alzheimer's disease with late onset: Secondary | ICD-10-CM | POA: Diagnosis not present

## 2017-03-14 DIAGNOSIS — G301 Alzheimer's disease with late onset: Secondary | ICD-10-CM | POA: Diagnosis not present

## 2017-03-14 DIAGNOSIS — F333 Major depressive disorder, recurrent, severe with psychotic symptoms: Secondary | ICD-10-CM | POA: Diagnosis not present

## 2017-03-15 DIAGNOSIS — F332 Major depressive disorder, recurrent severe without psychotic features: Secondary | ICD-10-CM | POA: Diagnosis not present

## 2017-03-15 DIAGNOSIS — F0281 Dementia in other diseases classified elsewhere with behavioral disturbance: Secondary | ICD-10-CM | POA: Diagnosis not present

## 2017-03-16 DIAGNOSIS — F0281 Dementia in other diseases classified elsewhere with behavioral disturbance: Secondary | ICD-10-CM | POA: Diagnosis not present

## 2017-03-16 DIAGNOSIS — F332 Major depressive disorder, recurrent severe without psychotic features: Secondary | ICD-10-CM | POA: Diagnosis not present

## 2017-03-17 DIAGNOSIS — G3184 Mild cognitive impairment, so stated: Secondary | ICD-10-CM | POA: Diagnosis not present

## 2017-03-17 DIAGNOSIS — F333 Major depressive disorder, recurrent, severe with psychotic symptoms: Secondary | ICD-10-CM | POA: Diagnosis not present

## 2017-03-17 DIAGNOSIS — G301 Alzheimer's disease with late onset: Secondary | ICD-10-CM | POA: Diagnosis not present

## 2017-03-18 DIAGNOSIS — G301 Alzheimer's disease with late onset: Secondary | ICD-10-CM | POA: Diagnosis not present

## 2017-03-18 DIAGNOSIS — G3184 Mild cognitive impairment, so stated: Secondary | ICD-10-CM | POA: Diagnosis not present

## 2017-03-18 DIAGNOSIS — F333 Major depressive disorder, recurrent, severe with psychotic symptoms: Secondary | ICD-10-CM | POA: Diagnosis not present

## 2017-03-19 DIAGNOSIS — R296 Repeated falls: Secondary | ICD-10-CM | POA: Diagnosis not present

## 2017-03-19 DIAGNOSIS — R4189 Other symptoms and signs involving cognitive functions and awareness: Secondary | ICD-10-CM | POA: Diagnosis not present

## 2017-03-19 DIAGNOSIS — N390 Urinary tract infection, site not specified: Secondary | ICD-10-CM | POA: Diagnosis not present

## 2017-03-19 DIAGNOSIS — F333 Major depressive disorder, recurrent, severe with psychotic symptoms: Secondary | ICD-10-CM | POA: Diagnosis not present

## 2017-03-19 DIAGNOSIS — R45851 Suicidal ideations: Secondary | ICD-10-CM | POA: Diagnosis not present

## 2017-03-19 DIAGNOSIS — R531 Weakness: Secondary | ICD-10-CM | POA: Diagnosis not present

## 2017-03-19 DIAGNOSIS — G309 Alzheimer's disease, unspecified: Secondary | ICD-10-CM | POA: Diagnosis not present

## 2017-03-19 DIAGNOSIS — F0281 Dementia in other diseases classified elsewhere with behavioral disturbance: Secondary | ICD-10-CM | POA: Diagnosis not present

## 2017-03-19 DIAGNOSIS — F419 Anxiety disorder, unspecified: Secondary | ICD-10-CM | POA: Diagnosis not present

## 2017-03-19 DIAGNOSIS — Z9114 Patient's other noncompliance with medication regimen: Secondary | ICD-10-CM | POA: Diagnosis not present

## 2017-03-21 DIAGNOSIS — F0281 Dementia in other diseases classified elsewhere with behavioral disturbance: Secondary | ICD-10-CM | POA: Diagnosis not present

## 2017-03-21 DIAGNOSIS — F332 Major depressive disorder, recurrent severe without psychotic features: Secondary | ICD-10-CM | POA: Diagnosis not present

## 2017-03-22 DIAGNOSIS — F332 Major depressive disorder, recurrent severe without psychotic features: Secondary | ICD-10-CM | POA: Diagnosis not present

## 2017-03-22 DIAGNOSIS — F0281 Dementia in other diseases classified elsewhere with behavioral disturbance: Secondary | ICD-10-CM | POA: Diagnosis not present

## 2017-03-23 DIAGNOSIS — F332 Major depressive disorder, recurrent severe without psychotic features: Secondary | ICD-10-CM | POA: Diagnosis not present

## 2017-03-23 DIAGNOSIS — F0281 Dementia in other diseases classified elsewhere with behavioral disturbance: Secondary | ICD-10-CM | POA: Diagnosis not present

## 2017-03-24 DIAGNOSIS — F332 Major depressive disorder, recurrent severe without psychotic features: Secondary | ICD-10-CM | POA: Diagnosis not present

## 2017-03-24 DIAGNOSIS — F0281 Dementia in other diseases classified elsewhere with behavioral disturbance: Secondary | ICD-10-CM | POA: Diagnosis not present

## 2017-03-25 DIAGNOSIS — F332 Major depressive disorder, recurrent severe without psychotic features: Secondary | ICD-10-CM | POA: Diagnosis not present

## 2017-03-25 DIAGNOSIS — F0281 Dementia in other diseases classified elsewhere with behavioral disturbance: Secondary | ICD-10-CM | POA: Diagnosis not present

## 2017-03-26 DIAGNOSIS — F332 Major depressive disorder, recurrent severe without psychotic features: Secondary | ICD-10-CM | POA: Diagnosis not present

## 2017-03-26 DIAGNOSIS — F0281 Dementia in other diseases classified elsewhere with behavioral disturbance: Secondary | ICD-10-CM | POA: Diagnosis not present

## 2017-03-27 DIAGNOSIS — F332 Major depressive disorder, recurrent severe without psychotic features: Secondary | ICD-10-CM | POA: Diagnosis not present

## 2017-03-27 DIAGNOSIS — F0281 Dementia in other diseases classified elsewhere with behavioral disturbance: Secondary | ICD-10-CM | POA: Diagnosis not present

## 2017-03-28 DIAGNOSIS — F0281 Dementia in other diseases classified elsewhere with behavioral disturbance: Secondary | ICD-10-CM | POA: Diagnosis not present

## 2017-03-28 DIAGNOSIS — F332 Major depressive disorder, recurrent severe without psychotic features: Secondary | ICD-10-CM | POA: Diagnosis not present

## 2017-03-29 DIAGNOSIS — F0281 Dementia in other diseases classified elsewhere with behavioral disturbance: Secondary | ICD-10-CM | POA: Diagnosis not present

## 2017-03-29 DIAGNOSIS — F332 Major depressive disorder, recurrent severe without psychotic features: Secondary | ICD-10-CM | POA: Diagnosis not present

## 2017-03-30 DIAGNOSIS — M25552 Pain in left hip: Secondary | ICD-10-CM | POA: Diagnosis not present

## 2017-03-31 DIAGNOSIS — G301 Alzheimer's disease with late onset: Secondary | ICD-10-CM | POA: Diagnosis not present

## 2017-03-31 DIAGNOSIS — F0281 Dementia in other diseases classified elsewhere with behavioral disturbance: Secondary | ICD-10-CM | POA: Diagnosis not present

## 2017-04-01 DIAGNOSIS — F0281 Dementia in other diseases classified elsewhere with behavioral disturbance: Secondary | ICD-10-CM | POA: Diagnosis not present

## 2017-04-01 DIAGNOSIS — G301 Alzheimer's disease with late onset: Secondary | ICD-10-CM | POA: Diagnosis not present

## 2017-04-02 DIAGNOSIS — F332 Major depressive disorder, recurrent severe without psychotic features: Secondary | ICD-10-CM | POA: Diagnosis not present

## 2017-04-02 DIAGNOSIS — F0281 Dementia in other diseases classified elsewhere with behavioral disturbance: Secondary | ICD-10-CM | POA: Diagnosis not present

## 2017-04-25 DIAGNOSIS — I10 Essential (primary) hypertension: Secondary | ICD-10-CM | POA: Diagnosis not present

## 2017-04-25 DIAGNOSIS — E7800 Pure hypercholesterolemia, unspecified: Secondary | ICD-10-CM | POA: Diagnosis not present

## 2017-04-25 DIAGNOSIS — N390 Urinary tract infection, site not specified: Secondary | ICD-10-CM | POA: Diagnosis not present

## 2017-04-25 DIAGNOSIS — Z87440 Personal history of urinary (tract) infections: Secondary | ICD-10-CM | POA: Diagnosis not present

## 2017-04-25 DIAGNOSIS — G309 Alzheimer's disease, unspecified: Secondary | ICD-10-CM | POA: Diagnosis not present

## 2017-04-25 DIAGNOSIS — R079 Chest pain, unspecified: Secondary | ICD-10-CM | POA: Diagnosis not present

## 2017-04-25 DIAGNOSIS — R531 Weakness: Secondary | ICD-10-CM | POA: Diagnosis not present

## 2017-04-25 DIAGNOSIS — R600 Localized edema: Secondary | ICD-10-CM | POA: Diagnosis not present

## 2017-04-25 DIAGNOSIS — R7989 Other specified abnormal findings of blood chemistry: Secondary | ICD-10-CM | POA: Diagnosis not present

## 2017-04-25 DIAGNOSIS — F0280 Dementia in other diseases classified elsewhere without behavioral disturbance: Secondary | ICD-10-CM | POA: Diagnosis not present

## 2017-04-25 DIAGNOSIS — G20 Parkinson's disease: Secondary | ICD-10-CM | POA: Diagnosis not present

## 2017-05-01 DIAGNOSIS — R404 Transient alteration of awareness: Secondary | ICD-10-CM | POA: Diagnosis not present

## 2017-05-01 DIAGNOSIS — M25551 Pain in right hip: Secondary | ICD-10-CM | POA: Diagnosis not present

## 2017-05-01 DIAGNOSIS — Z7982 Long term (current) use of aspirin: Secondary | ICD-10-CM | POA: Diagnosis not present

## 2017-05-01 DIAGNOSIS — R531 Weakness: Secondary | ICD-10-CM | POA: Diagnosis not present

## 2017-05-01 DIAGNOSIS — S3993XA Unspecified injury of pelvis, initial encounter: Secondary | ICD-10-CM | POA: Diagnosis not present

## 2017-05-01 DIAGNOSIS — E039 Hypothyroidism, unspecified: Secondary | ICD-10-CM | POA: Diagnosis not present

## 2017-05-01 DIAGNOSIS — G309 Alzheimer's disease, unspecified: Secondary | ICD-10-CM | POA: Diagnosis not present

## 2017-05-01 DIAGNOSIS — Z79899 Other long term (current) drug therapy: Secondary | ICD-10-CM | POA: Diagnosis not present

## 2017-05-01 DIAGNOSIS — F0280 Dementia in other diseases classified elsewhere without behavioral disturbance: Secondary | ICD-10-CM | POA: Diagnosis not present

## 2017-05-01 DIAGNOSIS — I10 Essential (primary) hypertension: Secondary | ICD-10-CM | POA: Diagnosis not present

## 2017-05-01 DIAGNOSIS — F17210 Nicotine dependence, cigarettes, uncomplicated: Secondary | ICD-10-CM | POA: Diagnosis not present

## 2017-05-01 DIAGNOSIS — F0390 Unspecified dementia without behavioral disturbance: Secondary | ICD-10-CM | POA: Diagnosis not present

## 2017-05-01 DIAGNOSIS — Z9181 History of falling: Secondary | ICD-10-CM | POA: Diagnosis not present

## 2017-05-03 DIAGNOSIS — F29 Unspecified psychosis not due to a substance or known physiological condition: Secondary | ICD-10-CM | POA: Diagnosis not present

## 2017-05-03 DIAGNOSIS — R45851 Suicidal ideations: Secondary | ICD-10-CM | POA: Diagnosis not present

## 2017-05-03 DIAGNOSIS — F23 Brief psychotic disorder: Secondary | ICD-10-CM | POA: Diagnosis not present

## 2017-05-03 DIAGNOSIS — F0390 Unspecified dementia without behavioral disturbance: Secondary | ICD-10-CM | POA: Diagnosis not present

## 2017-05-09 DIAGNOSIS — E785 Hyperlipidemia, unspecified: Secondary | ICD-10-CM | POA: Diagnosis not present

## 2017-05-09 DIAGNOSIS — F17210 Nicotine dependence, cigarettes, uncomplicated: Secondary | ICD-10-CM | POA: Diagnosis not present

## 2017-05-09 DIAGNOSIS — F319 Bipolar disorder, unspecified: Secondary | ICD-10-CM | POA: Diagnosis not present

## 2017-05-09 DIAGNOSIS — F0390 Unspecified dementia without behavioral disturbance: Secondary | ICD-10-CM | POA: Diagnosis not present

## 2017-05-09 DIAGNOSIS — Z7982 Long term (current) use of aspirin: Secondary | ICD-10-CM | POA: Diagnosis not present

## 2017-05-09 DIAGNOSIS — G20 Parkinson's disease: Secondary | ICD-10-CM | POA: Diagnosis not present

## 2017-05-09 DIAGNOSIS — I10 Essential (primary) hypertension: Secondary | ICD-10-CM | POA: Diagnosis not present

## 2017-05-09 DIAGNOSIS — R45851 Suicidal ideations: Secondary | ICD-10-CM | POA: Diagnosis not present

## 2017-05-09 DIAGNOSIS — F0280 Dementia in other diseases classified elsewhere without behavioral disturbance: Secondary | ICD-10-CM | POA: Diagnosis not present

## 2017-05-09 DIAGNOSIS — Z751 Person awaiting admission to adequate facility elsewhere: Secondary | ICD-10-CM | POA: Diagnosis not present

## 2017-05-09 DIAGNOSIS — G309 Alzheimer's disease, unspecified: Secondary | ICD-10-CM | POA: Diagnosis not present

## 2017-05-09 DIAGNOSIS — F418 Other specified anxiety disorders: Secondary | ICD-10-CM | POA: Diagnosis not present

## 2017-05-09 DIAGNOSIS — K219 Gastro-esophageal reflux disease without esophagitis: Secondary | ICD-10-CM | POA: Diagnosis not present

## 2017-05-09 DIAGNOSIS — E039 Hypothyroidism, unspecified: Secondary | ICD-10-CM

## 2017-05-09 DIAGNOSIS — A0472 Enterocolitis due to Clostridium difficile, not specified as recurrent: Secondary | ICD-10-CM | POA: Diagnosis not present

## 2017-05-10 DIAGNOSIS — F319 Bipolar disorder, unspecified: Secondary | ICD-10-CM | POA: Diagnosis not present

## 2017-05-10 DIAGNOSIS — F17210 Nicotine dependence, cigarettes, uncomplicated: Secondary | ICD-10-CM | POA: Diagnosis not present

## 2017-05-10 DIAGNOSIS — G20 Parkinson's disease: Secondary | ICD-10-CM | POA: Diagnosis not present

## 2017-05-10 DIAGNOSIS — Z7982 Long term (current) use of aspirin: Secondary | ICD-10-CM | POA: Diagnosis not present

## 2017-05-10 DIAGNOSIS — F0280 Dementia in other diseases classified elsewhere without behavioral disturbance: Secondary | ICD-10-CM | POA: Diagnosis not present

## 2017-05-10 DIAGNOSIS — E785 Hyperlipidemia, unspecified: Secondary | ICD-10-CM | POA: Diagnosis not present

## 2017-05-10 DIAGNOSIS — G309 Alzheimer's disease, unspecified: Secondary | ICD-10-CM | POA: Diagnosis not present

## 2017-05-10 DIAGNOSIS — A0472 Enterocolitis due to Clostridium difficile, not specified as recurrent: Secondary | ICD-10-CM | POA: Diagnosis not present

## 2017-05-10 DIAGNOSIS — K219 Gastro-esophageal reflux disease without esophagitis: Secondary | ICD-10-CM | POA: Diagnosis not present

## 2017-05-10 DIAGNOSIS — F418 Other specified anxiety disorders: Secondary | ICD-10-CM | POA: Diagnosis not present

## 2017-05-10 DIAGNOSIS — R45851 Suicidal ideations: Secondary | ICD-10-CM | POA: Diagnosis not present

## 2017-05-16 DIAGNOSIS — F0280 Dementia in other diseases classified elsewhere without behavioral disturbance: Secondary | ICD-10-CM | POA: Diagnosis not present

## 2017-05-16 DIAGNOSIS — R45851 Suicidal ideations: Secondary | ICD-10-CM | POA: Diagnosis not present

## 2017-05-16 DIAGNOSIS — K219 Gastro-esophageal reflux disease without esophagitis: Secondary | ICD-10-CM | POA: Diagnosis not present

## 2017-05-16 DIAGNOSIS — F418 Other specified anxiety disorders: Secondary | ICD-10-CM | POA: Diagnosis not present

## 2017-05-16 DIAGNOSIS — I10 Essential (primary) hypertension: Secondary | ICD-10-CM | POA: Diagnosis not present

## 2017-05-16 DIAGNOSIS — A0472 Enterocolitis due to Clostridium difficile, not specified as recurrent: Secondary | ICD-10-CM | POA: Diagnosis not present

## 2017-05-16 DIAGNOSIS — Z7982 Long term (current) use of aspirin: Secondary | ICD-10-CM | POA: Diagnosis not present

## 2017-05-16 DIAGNOSIS — F319 Bipolar disorder, unspecified: Secondary | ICD-10-CM | POA: Diagnosis not present

## 2017-05-16 DIAGNOSIS — G20 Parkinson's disease: Secondary | ICD-10-CM | POA: Diagnosis not present

## 2017-05-16 DIAGNOSIS — F17210 Nicotine dependence, cigarettes, uncomplicated: Secondary | ICD-10-CM | POA: Diagnosis not present

## 2017-05-16 DIAGNOSIS — G309 Alzheimer's disease, unspecified: Secondary | ICD-10-CM | POA: Diagnosis not present

## 2017-05-19 DIAGNOSIS — I10 Essential (primary) hypertension: Secondary | ICD-10-CM | POA: Diagnosis not present

## 2017-05-23 DIAGNOSIS — F17210 Nicotine dependence, cigarettes, uncomplicated: Secondary | ICD-10-CM | POA: Diagnosis not present

## 2017-05-23 DIAGNOSIS — F319 Bipolar disorder, unspecified: Secondary | ICD-10-CM | POA: Diagnosis not present

## 2017-05-23 DIAGNOSIS — Z7982 Long term (current) use of aspirin: Secondary | ICD-10-CM | POA: Diagnosis not present

## 2017-05-23 DIAGNOSIS — F418 Other specified anxiety disorders: Secondary | ICD-10-CM | POA: Diagnosis not present

## 2017-05-23 DIAGNOSIS — G309 Alzheimer's disease, unspecified: Secondary | ICD-10-CM | POA: Diagnosis not present

## 2017-05-23 DIAGNOSIS — R45851 Suicidal ideations: Secondary | ICD-10-CM | POA: Diagnosis not present

## 2017-05-23 DIAGNOSIS — G20 Parkinson's disease: Secondary | ICD-10-CM | POA: Diagnosis not present

## 2017-05-23 DIAGNOSIS — A0472 Enterocolitis due to Clostridium difficile, not specified as recurrent: Secondary | ICD-10-CM | POA: Diagnosis not present

## 2017-05-23 DIAGNOSIS — K219 Gastro-esophageal reflux disease without esophagitis: Secondary | ICD-10-CM | POA: Diagnosis not present

## 2017-05-23 DIAGNOSIS — F0280 Dementia in other diseases classified elsewhere without behavioral disturbance: Secondary | ICD-10-CM | POA: Diagnosis not present

## 2017-05-23 DIAGNOSIS — E785 Hyperlipidemia, unspecified: Secondary | ICD-10-CM | POA: Diagnosis not present

## 2017-05-30 DIAGNOSIS — G20 Parkinson's disease: Secondary | ICD-10-CM | POA: Diagnosis not present

## 2017-05-30 DIAGNOSIS — G309 Alzheimer's disease, unspecified: Secondary | ICD-10-CM | POA: Diagnosis not present

## 2017-05-30 DIAGNOSIS — K219 Gastro-esophageal reflux disease without esophagitis: Secondary | ICD-10-CM | POA: Diagnosis not present

## 2017-05-30 DIAGNOSIS — A0472 Enterocolitis due to Clostridium difficile, not specified as recurrent: Secondary | ICD-10-CM | POA: Diagnosis not present

## 2017-05-30 DIAGNOSIS — E785 Hyperlipidemia, unspecified: Secondary | ICD-10-CM | POA: Diagnosis not present

## 2017-05-30 DIAGNOSIS — F319 Bipolar disorder, unspecified: Secondary | ICD-10-CM | POA: Diagnosis not present

## 2017-05-30 DIAGNOSIS — F17210 Nicotine dependence, cigarettes, uncomplicated: Secondary | ICD-10-CM | POA: Diagnosis not present

## 2017-05-30 DIAGNOSIS — F418 Other specified anxiety disorders: Secondary | ICD-10-CM | POA: Diagnosis not present

## 2017-05-30 DIAGNOSIS — Z7982 Long term (current) use of aspirin: Secondary | ICD-10-CM | POA: Diagnosis not present

## 2017-05-30 DIAGNOSIS — F0280 Dementia in other diseases classified elsewhere without behavioral disturbance: Secondary | ICD-10-CM | POA: Diagnosis not present

## 2017-05-30 DIAGNOSIS — R45851 Suicidal ideations: Secondary | ICD-10-CM | POA: Diagnosis not present

## 2017-06-06 DIAGNOSIS — F319 Bipolar disorder, unspecified: Secondary | ICD-10-CM | POA: Diagnosis not present

## 2017-06-06 DIAGNOSIS — Z7982 Long term (current) use of aspirin: Secondary | ICD-10-CM | POA: Diagnosis not present

## 2017-06-06 DIAGNOSIS — A0472 Enterocolitis due to Clostridium difficile, not specified as recurrent: Secondary | ICD-10-CM | POA: Diagnosis not present

## 2017-06-06 DIAGNOSIS — G20 Parkinson's disease: Secondary | ICD-10-CM | POA: Diagnosis not present

## 2017-06-06 DIAGNOSIS — E785 Hyperlipidemia, unspecified: Secondary | ICD-10-CM | POA: Diagnosis not present

## 2017-06-06 DIAGNOSIS — G309 Alzheimer's disease, unspecified: Secondary | ICD-10-CM | POA: Diagnosis not present

## 2017-06-06 DIAGNOSIS — F0280 Dementia in other diseases classified elsewhere without behavioral disturbance: Secondary | ICD-10-CM | POA: Diagnosis not present

## 2017-06-06 DIAGNOSIS — F418 Other specified anxiety disorders: Secondary | ICD-10-CM | POA: Diagnosis not present

## 2017-06-06 DIAGNOSIS — F17210 Nicotine dependence, cigarettes, uncomplicated: Secondary | ICD-10-CM | POA: Diagnosis not present

## 2017-06-06 DIAGNOSIS — R45851 Suicidal ideations: Secondary | ICD-10-CM | POA: Diagnosis not present

## 2017-06-06 DIAGNOSIS — K219 Gastro-esophageal reflux disease without esophagitis: Secondary | ICD-10-CM | POA: Diagnosis not present

## 2017-06-12 DIAGNOSIS — G20 Parkinson's disease: Secondary | ICD-10-CM | POA: Diagnosis not present

## 2017-06-12 DIAGNOSIS — F419 Anxiety disorder, unspecified: Secondary | ICD-10-CM | POA: Diagnosis not present

## 2017-06-12 DIAGNOSIS — R41841 Cognitive communication deficit: Secondary | ICD-10-CM | POA: Diagnosis not present

## 2017-06-12 DIAGNOSIS — M6281 Muscle weakness (generalized): Secondary | ICD-10-CM | POA: Diagnosis not present

## 2017-06-12 DIAGNOSIS — R633 Feeding difficulties: Secondary | ICD-10-CM | POA: Diagnosis not present

## 2017-06-12 DIAGNOSIS — G300 Alzheimer's disease with early onset: Secondary | ICD-10-CM | POA: Diagnosis not present

## 2017-06-12 DIAGNOSIS — F0280 Dementia in other diseases classified elsewhere without behavioral disturbance: Secondary | ICD-10-CM | POA: Diagnosis not present

## 2017-06-13 DIAGNOSIS — G300 Alzheimer's disease with early onset: Secondary | ICD-10-CM | POA: Diagnosis not present

## 2017-06-13 DIAGNOSIS — R41841 Cognitive communication deficit: Secondary | ICD-10-CM | POA: Diagnosis not present

## 2017-06-13 DIAGNOSIS — F419 Anxiety disorder, unspecified: Secondary | ICD-10-CM | POA: Diagnosis not present

## 2017-06-13 DIAGNOSIS — M6281 Muscle weakness (generalized): Secondary | ICD-10-CM | POA: Diagnosis not present

## 2017-06-13 DIAGNOSIS — G20 Parkinson's disease: Secondary | ICD-10-CM | POA: Diagnosis not present

## 2017-06-13 DIAGNOSIS — R633 Feeding difficulties: Secondary | ICD-10-CM | POA: Diagnosis not present

## 2017-06-13 DIAGNOSIS — F0280 Dementia in other diseases classified elsewhere without behavioral disturbance: Secondary | ICD-10-CM | POA: Diagnosis not present

## 2017-06-14 DIAGNOSIS — R41841 Cognitive communication deficit: Secondary | ICD-10-CM | POA: Diagnosis not present

## 2017-06-14 DIAGNOSIS — G8929 Other chronic pain: Secondary | ICD-10-CM | POA: Diagnosis not present

## 2017-06-14 DIAGNOSIS — I10 Essential (primary) hypertension: Secondary | ICD-10-CM | POA: Diagnosis not present

## 2017-06-14 DIAGNOSIS — G20 Parkinson's disease: Secondary | ICD-10-CM | POA: Diagnosis not present

## 2017-06-14 DIAGNOSIS — F419 Anxiety disorder, unspecified: Secondary | ICD-10-CM | POA: Diagnosis not present

## 2017-06-14 DIAGNOSIS — F0281 Dementia in other diseases classified elsewhere with behavioral disturbance: Secondary | ICD-10-CM | POA: Diagnosis not present

## 2017-06-14 DIAGNOSIS — M6281 Muscle weakness (generalized): Secondary | ICD-10-CM | POA: Diagnosis not present

## 2017-06-14 DIAGNOSIS — F0280 Dementia in other diseases classified elsewhere without behavioral disturbance: Secondary | ICD-10-CM | POA: Diagnosis not present

## 2017-06-14 DIAGNOSIS — G300 Alzheimer's disease with early onset: Secondary | ICD-10-CM | POA: Diagnosis not present

## 2017-06-14 DIAGNOSIS — R633 Feeding difficulties: Secondary | ICD-10-CM | POA: Diagnosis not present

## 2017-06-15 DIAGNOSIS — N3281 Overactive bladder: Secondary | ICD-10-CM | POA: Diagnosis not present

## 2017-06-15 DIAGNOSIS — E782 Mixed hyperlipidemia: Secondary | ICD-10-CM | POA: Diagnosis not present

## 2017-06-15 DIAGNOSIS — E039 Hypothyroidism, unspecified: Secondary | ICD-10-CM | POA: Diagnosis not present

## 2017-06-15 DIAGNOSIS — I10 Essential (primary) hypertension: Secondary | ICD-10-CM | POA: Diagnosis not present

## 2017-06-16 DIAGNOSIS — F419 Anxiety disorder, unspecified: Secondary | ICD-10-CM | POA: Diagnosis not present

## 2017-06-16 DIAGNOSIS — F0280 Dementia in other diseases classified elsewhere without behavioral disturbance: Secondary | ICD-10-CM | POA: Diagnosis not present

## 2017-06-16 DIAGNOSIS — G300 Alzheimer's disease with early onset: Secondary | ICD-10-CM | POA: Diagnosis not present

## 2017-06-16 DIAGNOSIS — R633 Feeding difficulties: Secondary | ICD-10-CM | POA: Diagnosis not present

## 2017-06-16 DIAGNOSIS — G20 Parkinson's disease: Secondary | ICD-10-CM | POA: Diagnosis not present

## 2017-06-16 DIAGNOSIS — M6281 Muscle weakness (generalized): Secondary | ICD-10-CM | POA: Diagnosis not present

## 2017-06-16 DIAGNOSIS — R41841 Cognitive communication deficit: Secondary | ICD-10-CM | POA: Diagnosis not present

## 2017-06-17 DIAGNOSIS — M6281 Muscle weakness (generalized): Secondary | ICD-10-CM | POA: Diagnosis not present

## 2017-06-17 DIAGNOSIS — G300 Alzheimer's disease with early onset: Secondary | ICD-10-CM | POA: Diagnosis not present

## 2017-06-17 DIAGNOSIS — F419 Anxiety disorder, unspecified: Secondary | ICD-10-CM | POA: Diagnosis not present

## 2017-06-17 DIAGNOSIS — R633 Feeding difficulties: Secondary | ICD-10-CM | POA: Diagnosis not present

## 2017-06-17 DIAGNOSIS — R41841 Cognitive communication deficit: Secondary | ICD-10-CM | POA: Diagnosis not present

## 2017-06-17 DIAGNOSIS — F0280 Dementia in other diseases classified elsewhere without behavioral disturbance: Secondary | ICD-10-CM | POA: Diagnosis not present

## 2017-06-17 DIAGNOSIS — G20 Parkinson's disease: Secondary | ICD-10-CM | POA: Diagnosis not present

## 2017-06-18 DIAGNOSIS — Z79899 Other long term (current) drug therapy: Secondary | ICD-10-CM | POA: Diagnosis not present

## 2017-06-18 DIAGNOSIS — F419 Anxiety disorder, unspecified: Secondary | ICD-10-CM | POA: Diagnosis not present

## 2017-06-18 DIAGNOSIS — G20 Parkinson's disease: Secondary | ICD-10-CM | POA: Diagnosis not present

## 2017-06-18 DIAGNOSIS — G300 Alzheimer's disease with early onset: Secondary | ICD-10-CM | POA: Diagnosis not present

## 2017-06-18 DIAGNOSIS — E785 Hyperlipidemia, unspecified: Secondary | ICD-10-CM | POA: Diagnosis not present

## 2017-06-18 DIAGNOSIS — R633 Feeding difficulties: Secondary | ICD-10-CM | POA: Diagnosis not present

## 2017-06-18 DIAGNOSIS — I10 Essential (primary) hypertension: Secondary | ICD-10-CM | POA: Diagnosis not present

## 2017-06-18 DIAGNOSIS — E119 Type 2 diabetes mellitus without complications: Secondary | ICD-10-CM | POA: Diagnosis not present

## 2017-06-18 DIAGNOSIS — E039 Hypothyroidism, unspecified: Secondary | ICD-10-CM | POA: Diagnosis not present

## 2017-06-18 DIAGNOSIS — R41841 Cognitive communication deficit: Secondary | ICD-10-CM | POA: Diagnosis not present

## 2017-06-18 DIAGNOSIS — M6281 Muscle weakness (generalized): Secondary | ICD-10-CM | POA: Diagnosis not present

## 2017-06-18 DIAGNOSIS — F0280 Dementia in other diseases classified elsewhere without behavioral disturbance: Secondary | ICD-10-CM | POA: Diagnosis not present

## 2017-06-18 DIAGNOSIS — D649 Anemia, unspecified: Secondary | ICD-10-CM | POA: Diagnosis not present

## 2017-06-19 DIAGNOSIS — G20 Parkinson's disease: Secondary | ICD-10-CM | POA: Diagnosis not present

## 2017-06-19 DIAGNOSIS — R41841 Cognitive communication deficit: Secondary | ICD-10-CM | POA: Diagnosis not present

## 2017-06-19 DIAGNOSIS — G300 Alzheimer's disease with early onset: Secondary | ICD-10-CM | POA: Diagnosis not present

## 2017-06-19 DIAGNOSIS — R633 Feeding difficulties: Secondary | ICD-10-CM | POA: Diagnosis not present

## 2017-06-19 DIAGNOSIS — F0280 Dementia in other diseases classified elsewhere without behavioral disturbance: Secondary | ICD-10-CM | POA: Diagnosis not present

## 2017-06-19 DIAGNOSIS — F419 Anxiety disorder, unspecified: Secondary | ICD-10-CM | POA: Diagnosis not present

## 2017-06-19 DIAGNOSIS — M6281 Muscle weakness (generalized): Secondary | ICD-10-CM | POA: Diagnosis not present

## 2017-06-20 DIAGNOSIS — G300 Alzheimer's disease with early onset: Secondary | ICD-10-CM | POA: Diagnosis not present

## 2017-06-20 DIAGNOSIS — G20 Parkinson's disease: Secondary | ICD-10-CM | POA: Diagnosis not present

## 2017-06-20 DIAGNOSIS — F0280 Dementia in other diseases classified elsewhere without behavioral disturbance: Secondary | ICD-10-CM | POA: Diagnosis not present

## 2017-06-20 DIAGNOSIS — R41841 Cognitive communication deficit: Secondary | ICD-10-CM | POA: Diagnosis not present

## 2017-06-20 DIAGNOSIS — F419 Anxiety disorder, unspecified: Secondary | ICD-10-CM | POA: Diagnosis not present

## 2017-06-20 DIAGNOSIS — R633 Feeding difficulties: Secondary | ICD-10-CM | POA: Diagnosis not present

## 2017-06-20 DIAGNOSIS — M6281 Muscle weakness (generalized): Secondary | ICD-10-CM | POA: Diagnosis not present

## 2017-06-21 DIAGNOSIS — R41841 Cognitive communication deficit: Secondary | ICD-10-CM | POA: Diagnosis not present

## 2017-06-21 DIAGNOSIS — G20 Parkinson's disease: Secondary | ICD-10-CM | POA: Diagnosis not present

## 2017-06-21 DIAGNOSIS — G300 Alzheimer's disease with early onset: Secondary | ICD-10-CM | POA: Diagnosis not present

## 2017-06-21 DIAGNOSIS — R633 Feeding difficulties: Secondary | ICD-10-CM | POA: Diagnosis not present

## 2017-06-21 DIAGNOSIS — F0280 Dementia in other diseases classified elsewhere without behavioral disturbance: Secondary | ICD-10-CM | POA: Diagnosis not present

## 2017-06-21 DIAGNOSIS — M6281 Muscle weakness (generalized): Secondary | ICD-10-CM | POA: Diagnosis not present

## 2017-06-21 DIAGNOSIS — F419 Anxiety disorder, unspecified: Secondary | ICD-10-CM | POA: Diagnosis not present

## 2017-06-23 DIAGNOSIS — G20 Parkinson's disease: Secondary | ICD-10-CM | POA: Diagnosis not present

## 2017-06-23 DIAGNOSIS — R633 Feeding difficulties: Secondary | ICD-10-CM | POA: Diagnosis not present

## 2017-06-23 DIAGNOSIS — F0280 Dementia in other diseases classified elsewhere without behavioral disturbance: Secondary | ICD-10-CM | POA: Diagnosis not present

## 2017-06-23 DIAGNOSIS — M6281 Muscle weakness (generalized): Secondary | ICD-10-CM | POA: Diagnosis not present

## 2017-06-23 DIAGNOSIS — R41841 Cognitive communication deficit: Secondary | ICD-10-CM | POA: Diagnosis not present

## 2017-06-23 DIAGNOSIS — F419 Anxiety disorder, unspecified: Secondary | ICD-10-CM | POA: Diagnosis not present

## 2017-06-23 DIAGNOSIS — G300 Alzheimer's disease with early onset: Secondary | ICD-10-CM | POA: Diagnosis not present

## 2017-06-24 DIAGNOSIS — G20 Parkinson's disease: Secondary | ICD-10-CM | POA: Diagnosis not present

## 2017-06-24 DIAGNOSIS — F0280 Dementia in other diseases classified elsewhere without behavioral disturbance: Secondary | ICD-10-CM | POA: Diagnosis not present

## 2017-06-24 DIAGNOSIS — R41841 Cognitive communication deficit: Secondary | ICD-10-CM | POA: Diagnosis not present

## 2017-06-24 DIAGNOSIS — G300 Alzheimer's disease with early onset: Secondary | ICD-10-CM | POA: Diagnosis not present

## 2017-06-24 DIAGNOSIS — F419 Anxiety disorder, unspecified: Secondary | ICD-10-CM | POA: Diagnosis not present

## 2017-06-24 DIAGNOSIS — M6281 Muscle weakness (generalized): Secondary | ICD-10-CM | POA: Diagnosis not present

## 2017-06-24 DIAGNOSIS — R633 Feeding difficulties: Secondary | ICD-10-CM | POA: Diagnosis not present

## 2017-06-25 DIAGNOSIS — M6281 Muscle weakness (generalized): Secondary | ICD-10-CM | POA: Diagnosis not present

## 2017-06-25 DIAGNOSIS — F419 Anxiety disorder, unspecified: Secondary | ICD-10-CM | POA: Diagnosis not present

## 2017-06-25 DIAGNOSIS — I10 Essential (primary) hypertension: Secondary | ICD-10-CM | POA: Diagnosis not present

## 2017-06-25 DIAGNOSIS — E039 Hypothyroidism, unspecified: Secondary | ICD-10-CM | POA: Diagnosis not present

## 2017-06-25 DIAGNOSIS — G300 Alzheimer's disease with early onset: Secondary | ICD-10-CM | POA: Diagnosis not present

## 2017-06-25 DIAGNOSIS — R41841 Cognitive communication deficit: Secondary | ICD-10-CM | POA: Diagnosis not present

## 2017-06-25 DIAGNOSIS — E782 Mixed hyperlipidemia: Secondary | ICD-10-CM | POA: Diagnosis not present

## 2017-06-25 DIAGNOSIS — G20 Parkinson's disease: Secondary | ICD-10-CM | POA: Diagnosis not present

## 2017-06-25 DIAGNOSIS — F0280 Dementia in other diseases classified elsewhere without behavioral disturbance: Secondary | ICD-10-CM | POA: Diagnosis not present

## 2017-06-25 DIAGNOSIS — R633 Feeding difficulties: Secondary | ICD-10-CM | POA: Diagnosis not present

## 2017-06-26 DIAGNOSIS — R633 Feeding difficulties: Secondary | ICD-10-CM | POA: Diagnosis not present

## 2017-06-26 DIAGNOSIS — G300 Alzheimer's disease with early onset: Secondary | ICD-10-CM | POA: Diagnosis not present

## 2017-06-26 DIAGNOSIS — G20 Parkinson's disease: Secondary | ICD-10-CM | POA: Diagnosis not present

## 2017-06-26 DIAGNOSIS — F0280 Dementia in other diseases classified elsewhere without behavioral disturbance: Secondary | ICD-10-CM | POA: Diagnosis not present

## 2017-06-26 DIAGNOSIS — M6281 Muscle weakness (generalized): Secondary | ICD-10-CM | POA: Diagnosis not present

## 2017-06-26 DIAGNOSIS — F419 Anxiety disorder, unspecified: Secondary | ICD-10-CM | POA: Diagnosis not present

## 2017-06-26 DIAGNOSIS — R41841 Cognitive communication deficit: Secondary | ICD-10-CM | POA: Diagnosis not present

## 2017-06-27 DIAGNOSIS — F0280 Dementia in other diseases classified elsewhere without behavioral disturbance: Secondary | ICD-10-CM | POA: Diagnosis not present

## 2017-06-27 DIAGNOSIS — R633 Feeding difficulties: Secondary | ICD-10-CM | POA: Diagnosis not present

## 2017-06-27 DIAGNOSIS — G300 Alzheimer's disease with early onset: Secondary | ICD-10-CM | POA: Diagnosis not present

## 2017-06-27 DIAGNOSIS — F419 Anxiety disorder, unspecified: Secondary | ICD-10-CM | POA: Diagnosis not present

## 2017-06-27 DIAGNOSIS — R41841 Cognitive communication deficit: Secondary | ICD-10-CM | POA: Diagnosis not present

## 2017-06-27 DIAGNOSIS — M6281 Muscle weakness (generalized): Secondary | ICD-10-CM | POA: Diagnosis not present

## 2017-06-27 DIAGNOSIS — G20 Parkinson's disease: Secondary | ICD-10-CM | POA: Diagnosis not present

## 2017-06-28 DIAGNOSIS — F0280 Dementia in other diseases classified elsewhere without behavioral disturbance: Secondary | ICD-10-CM | POA: Diagnosis not present

## 2017-06-28 DIAGNOSIS — F419 Anxiety disorder, unspecified: Secondary | ICD-10-CM | POA: Diagnosis not present

## 2017-06-28 DIAGNOSIS — R41841 Cognitive communication deficit: Secondary | ICD-10-CM | POA: Diagnosis not present

## 2017-06-28 DIAGNOSIS — M6281 Muscle weakness (generalized): Secondary | ICD-10-CM | POA: Diagnosis not present

## 2017-06-28 DIAGNOSIS — G300 Alzheimer's disease with early onset: Secondary | ICD-10-CM | POA: Diagnosis not present

## 2017-06-28 DIAGNOSIS — G20 Parkinson's disease: Secondary | ICD-10-CM | POA: Diagnosis not present

## 2017-06-28 DIAGNOSIS — R633 Feeding difficulties: Secondary | ICD-10-CM | POA: Diagnosis not present

## 2017-07-02 DIAGNOSIS — R633 Feeding difficulties: Secondary | ICD-10-CM | POA: Diagnosis not present

## 2017-07-02 DIAGNOSIS — I10 Essential (primary) hypertension: Secondary | ICD-10-CM | POA: Diagnosis not present

## 2017-07-02 DIAGNOSIS — G300 Alzheimer's disease with early onset: Secondary | ICD-10-CM | POA: Diagnosis not present

## 2017-07-02 DIAGNOSIS — G8929 Other chronic pain: Secondary | ICD-10-CM | POA: Diagnosis not present

## 2017-07-02 DIAGNOSIS — F0280 Dementia in other diseases classified elsewhere without behavioral disturbance: Secondary | ICD-10-CM | POA: Diagnosis not present

## 2017-07-02 DIAGNOSIS — F419 Anxiety disorder, unspecified: Secondary | ICD-10-CM | POA: Diagnosis not present

## 2017-07-02 DIAGNOSIS — G20 Parkinson's disease: Secondary | ICD-10-CM | POA: Diagnosis not present

## 2017-07-02 DIAGNOSIS — E039 Hypothyroidism, unspecified: Secondary | ICD-10-CM | POA: Diagnosis not present

## 2017-07-02 DIAGNOSIS — R41841 Cognitive communication deficit: Secondary | ICD-10-CM | POA: Diagnosis not present

## 2017-07-02 DIAGNOSIS — E782 Mixed hyperlipidemia: Secondary | ICD-10-CM | POA: Diagnosis not present

## 2017-07-02 DIAGNOSIS — M6281 Muscle weakness (generalized): Secondary | ICD-10-CM | POA: Diagnosis not present

## 2017-07-03 DIAGNOSIS — R41841 Cognitive communication deficit: Secondary | ICD-10-CM | POA: Diagnosis not present

## 2017-07-03 DIAGNOSIS — F0280 Dementia in other diseases classified elsewhere without behavioral disturbance: Secondary | ICD-10-CM | POA: Diagnosis not present

## 2017-07-03 DIAGNOSIS — G300 Alzheimer's disease with early onset: Secondary | ICD-10-CM | POA: Diagnosis not present

## 2017-07-03 DIAGNOSIS — M6281 Muscle weakness (generalized): Secondary | ICD-10-CM | POA: Diagnosis not present

## 2017-07-03 DIAGNOSIS — R633 Feeding difficulties: Secondary | ICD-10-CM | POA: Diagnosis not present

## 2017-07-03 DIAGNOSIS — G20 Parkinson's disease: Secondary | ICD-10-CM | POA: Diagnosis not present

## 2017-07-03 DIAGNOSIS — F419 Anxiety disorder, unspecified: Secondary | ICD-10-CM | POA: Diagnosis not present

## 2017-07-04 DIAGNOSIS — R633 Feeding difficulties: Secondary | ICD-10-CM | POA: Diagnosis not present

## 2017-07-04 DIAGNOSIS — M6281 Muscle weakness (generalized): Secondary | ICD-10-CM | POA: Diagnosis not present

## 2017-07-04 DIAGNOSIS — R41841 Cognitive communication deficit: Secondary | ICD-10-CM | POA: Diagnosis not present

## 2017-07-04 DIAGNOSIS — G300 Alzheimer's disease with early onset: Secondary | ICD-10-CM | POA: Diagnosis not present

## 2017-07-04 DIAGNOSIS — F0280 Dementia in other diseases classified elsewhere without behavioral disturbance: Secondary | ICD-10-CM | POA: Diagnosis not present

## 2017-07-04 DIAGNOSIS — F419 Anxiety disorder, unspecified: Secondary | ICD-10-CM | POA: Diagnosis not present

## 2017-07-04 DIAGNOSIS — G20 Parkinson's disease: Secondary | ICD-10-CM | POA: Diagnosis not present

## 2017-07-05 DIAGNOSIS — F419 Anxiety disorder, unspecified: Secondary | ICD-10-CM | POA: Diagnosis not present

## 2017-07-05 DIAGNOSIS — M6281 Muscle weakness (generalized): Secondary | ICD-10-CM | POA: Diagnosis not present

## 2017-07-05 DIAGNOSIS — G20 Parkinson's disease: Secondary | ICD-10-CM | POA: Diagnosis not present

## 2017-07-05 DIAGNOSIS — R41841 Cognitive communication deficit: Secondary | ICD-10-CM | POA: Diagnosis not present

## 2017-07-05 DIAGNOSIS — F0280 Dementia in other diseases classified elsewhere without behavioral disturbance: Secondary | ICD-10-CM | POA: Diagnosis not present

## 2017-07-05 DIAGNOSIS — R633 Feeding difficulties: Secondary | ICD-10-CM | POA: Diagnosis not present

## 2017-07-05 DIAGNOSIS — G300 Alzheimer's disease with early onset: Secondary | ICD-10-CM | POA: Diagnosis not present

## 2017-07-06 DIAGNOSIS — M6281 Muscle weakness (generalized): Secondary | ICD-10-CM | POA: Diagnosis not present

## 2017-07-06 DIAGNOSIS — R41841 Cognitive communication deficit: Secondary | ICD-10-CM | POA: Diagnosis not present

## 2017-07-06 DIAGNOSIS — F419 Anxiety disorder, unspecified: Secondary | ICD-10-CM | POA: Diagnosis not present

## 2017-07-06 DIAGNOSIS — G300 Alzheimer's disease with early onset: Secondary | ICD-10-CM | POA: Diagnosis not present

## 2017-07-06 DIAGNOSIS — R633 Feeding difficulties: Secondary | ICD-10-CM | POA: Diagnosis not present

## 2017-07-06 DIAGNOSIS — G20 Parkinson's disease: Secondary | ICD-10-CM | POA: Diagnosis not present

## 2017-07-06 DIAGNOSIS — F0280 Dementia in other diseases classified elsewhere without behavioral disturbance: Secondary | ICD-10-CM | POA: Diagnosis not present

## 2017-07-09 DIAGNOSIS — E039 Hypothyroidism, unspecified: Secondary | ICD-10-CM | POA: Diagnosis not present

## 2017-07-09 DIAGNOSIS — R633 Feeding difficulties: Secondary | ICD-10-CM | POA: Diagnosis not present

## 2017-07-09 DIAGNOSIS — F419 Anxiety disorder, unspecified: Secondary | ICD-10-CM | POA: Diagnosis not present

## 2017-07-09 DIAGNOSIS — G20 Parkinson's disease: Secondary | ICD-10-CM | POA: Diagnosis not present

## 2017-07-09 DIAGNOSIS — M6281 Muscle weakness (generalized): Secondary | ICD-10-CM | POA: Diagnosis not present

## 2017-07-09 DIAGNOSIS — G300 Alzheimer's disease with early onset: Secondary | ICD-10-CM | POA: Diagnosis not present

## 2017-07-09 DIAGNOSIS — R41841 Cognitive communication deficit: Secondary | ICD-10-CM | POA: Diagnosis not present

## 2017-07-09 DIAGNOSIS — F0280 Dementia in other diseases classified elsewhere without behavioral disturbance: Secondary | ICD-10-CM | POA: Diagnosis not present

## 2017-07-09 DIAGNOSIS — N3281 Overactive bladder: Secondary | ICD-10-CM | POA: Diagnosis not present

## 2017-07-09 DIAGNOSIS — I10 Essential (primary) hypertension: Secondary | ICD-10-CM | POA: Diagnosis not present

## 2017-07-10 DIAGNOSIS — G20 Parkinson's disease: Secondary | ICD-10-CM | POA: Diagnosis not present

## 2017-07-10 DIAGNOSIS — R41841 Cognitive communication deficit: Secondary | ICD-10-CM | POA: Diagnosis not present

## 2017-07-10 DIAGNOSIS — G300 Alzheimer's disease with early onset: Secondary | ICD-10-CM | POA: Diagnosis not present

## 2017-07-10 DIAGNOSIS — M6281 Muscle weakness (generalized): Secondary | ICD-10-CM | POA: Diagnosis not present

## 2017-07-10 DIAGNOSIS — R633 Feeding difficulties: Secondary | ICD-10-CM | POA: Diagnosis not present

## 2017-07-10 DIAGNOSIS — F0280 Dementia in other diseases classified elsewhere without behavioral disturbance: Secondary | ICD-10-CM | POA: Diagnosis not present

## 2017-07-10 DIAGNOSIS — F419 Anxiety disorder, unspecified: Secondary | ICD-10-CM | POA: Diagnosis not present

## 2017-07-11 DIAGNOSIS — R41841 Cognitive communication deficit: Secondary | ICD-10-CM | POA: Diagnosis not present

## 2017-07-11 DIAGNOSIS — F419 Anxiety disorder, unspecified: Secondary | ICD-10-CM | POA: Diagnosis not present

## 2017-07-11 DIAGNOSIS — G20 Parkinson's disease: Secondary | ICD-10-CM | POA: Diagnosis not present

## 2017-07-11 DIAGNOSIS — R633 Feeding difficulties: Secondary | ICD-10-CM | POA: Diagnosis not present

## 2017-07-11 DIAGNOSIS — M6281 Muscle weakness (generalized): Secondary | ICD-10-CM | POA: Diagnosis not present

## 2017-07-11 DIAGNOSIS — G300 Alzheimer's disease with early onset: Secondary | ICD-10-CM | POA: Diagnosis not present

## 2017-07-11 DIAGNOSIS — F0280 Dementia in other diseases classified elsewhere without behavioral disturbance: Secondary | ICD-10-CM | POA: Diagnosis not present

## 2017-07-12 DIAGNOSIS — F0281 Dementia in other diseases classified elsewhere with behavioral disturbance: Secondary | ICD-10-CM | POA: Diagnosis not present

## 2017-07-12 DIAGNOSIS — G20 Parkinson's disease: Secondary | ICD-10-CM | POA: Diagnosis not present

## 2017-07-12 DIAGNOSIS — R41841 Cognitive communication deficit: Secondary | ICD-10-CM | POA: Diagnosis not present

## 2017-07-12 DIAGNOSIS — F0280 Dementia in other diseases classified elsewhere without behavioral disturbance: Secondary | ICD-10-CM | POA: Diagnosis not present

## 2017-07-12 DIAGNOSIS — N3281 Overactive bladder: Secondary | ICD-10-CM | POA: Diagnosis not present

## 2017-07-12 DIAGNOSIS — M5136 Other intervertebral disc degeneration, lumbar region: Secondary | ICD-10-CM | POA: Diagnosis not present

## 2017-07-12 DIAGNOSIS — F419 Anxiety disorder, unspecified: Secondary | ICD-10-CM | POA: Diagnosis not present

## 2017-07-12 DIAGNOSIS — G300 Alzheimer's disease with early onset: Secondary | ICD-10-CM | POA: Diagnosis not present

## 2017-07-12 DIAGNOSIS — M6281 Muscle weakness (generalized): Secondary | ICD-10-CM | POA: Diagnosis not present

## 2017-07-12 DIAGNOSIS — R633 Feeding difficulties: Secondary | ICD-10-CM | POA: Diagnosis not present

## 2017-07-13 DIAGNOSIS — M6281 Muscle weakness (generalized): Secondary | ICD-10-CM | POA: Diagnosis not present

## 2017-07-13 DIAGNOSIS — R633 Feeding difficulties: Secondary | ICD-10-CM | POA: Diagnosis not present

## 2017-07-13 DIAGNOSIS — F419 Anxiety disorder, unspecified: Secondary | ICD-10-CM | POA: Diagnosis not present

## 2017-07-13 DIAGNOSIS — R41841 Cognitive communication deficit: Secondary | ICD-10-CM | POA: Diagnosis not present

## 2017-07-13 DIAGNOSIS — G300 Alzheimer's disease with early onset: Secondary | ICD-10-CM | POA: Diagnosis not present

## 2017-07-13 DIAGNOSIS — F0280 Dementia in other diseases classified elsewhere without behavioral disturbance: Secondary | ICD-10-CM | POA: Diagnosis not present

## 2017-07-13 DIAGNOSIS — G20 Parkinson's disease: Secondary | ICD-10-CM | POA: Diagnosis not present

## 2017-07-16 DIAGNOSIS — E782 Mixed hyperlipidemia: Secondary | ICD-10-CM | POA: Diagnosis not present

## 2017-07-16 DIAGNOSIS — G8929 Other chronic pain: Secondary | ICD-10-CM | POA: Diagnosis not present

## 2017-07-16 DIAGNOSIS — R41841 Cognitive communication deficit: Secondary | ICD-10-CM | POA: Diagnosis not present

## 2017-07-16 DIAGNOSIS — F0280 Dementia in other diseases classified elsewhere without behavioral disturbance: Secondary | ICD-10-CM | POA: Diagnosis not present

## 2017-07-16 DIAGNOSIS — G20 Parkinson's disease: Secondary | ICD-10-CM | POA: Diagnosis not present

## 2017-07-16 DIAGNOSIS — M6281 Muscle weakness (generalized): Secondary | ICD-10-CM | POA: Diagnosis not present

## 2017-07-16 DIAGNOSIS — R633 Feeding difficulties: Secondary | ICD-10-CM | POA: Diagnosis not present

## 2017-07-16 DIAGNOSIS — F419 Anxiety disorder, unspecified: Secondary | ICD-10-CM | POA: Diagnosis not present

## 2017-07-16 DIAGNOSIS — I10 Essential (primary) hypertension: Secondary | ICD-10-CM | POA: Diagnosis not present

## 2017-07-16 DIAGNOSIS — G300 Alzheimer's disease with early onset: Secondary | ICD-10-CM | POA: Diagnosis not present

## 2017-07-17 DIAGNOSIS — F0280 Dementia in other diseases classified elsewhere without behavioral disturbance: Secondary | ICD-10-CM | POA: Diagnosis not present

## 2017-07-17 DIAGNOSIS — M6281 Muscle weakness (generalized): Secondary | ICD-10-CM | POA: Diagnosis not present

## 2017-07-17 DIAGNOSIS — G300 Alzheimer's disease with early onset: Secondary | ICD-10-CM | POA: Diagnosis not present

## 2017-07-17 DIAGNOSIS — R633 Feeding difficulties: Secondary | ICD-10-CM | POA: Diagnosis not present

## 2017-07-17 DIAGNOSIS — G20 Parkinson's disease: Secondary | ICD-10-CM | POA: Diagnosis not present

## 2017-07-17 DIAGNOSIS — R41841 Cognitive communication deficit: Secondary | ICD-10-CM | POA: Diagnosis not present

## 2017-07-17 DIAGNOSIS — F419 Anxiety disorder, unspecified: Secondary | ICD-10-CM | POA: Diagnosis not present

## 2017-07-18 DIAGNOSIS — F419 Anxiety disorder, unspecified: Secondary | ICD-10-CM | POA: Diagnosis not present

## 2017-07-18 DIAGNOSIS — M6281 Muscle weakness (generalized): Secondary | ICD-10-CM | POA: Diagnosis not present

## 2017-07-18 DIAGNOSIS — F0280 Dementia in other diseases classified elsewhere without behavioral disturbance: Secondary | ICD-10-CM | POA: Diagnosis not present

## 2017-07-18 DIAGNOSIS — G20 Parkinson's disease: Secondary | ICD-10-CM | POA: Diagnosis not present

## 2017-07-18 DIAGNOSIS — R633 Feeding difficulties: Secondary | ICD-10-CM | POA: Diagnosis not present

## 2017-07-18 DIAGNOSIS — R41841 Cognitive communication deficit: Secondary | ICD-10-CM | POA: Diagnosis not present

## 2017-07-18 DIAGNOSIS — G300 Alzheimer's disease with early onset: Secondary | ICD-10-CM | POA: Diagnosis not present

## 2017-07-19 DIAGNOSIS — G20 Parkinson's disease: Secondary | ICD-10-CM | POA: Diagnosis not present

## 2017-07-19 DIAGNOSIS — R41841 Cognitive communication deficit: Secondary | ICD-10-CM | POA: Diagnosis not present

## 2017-07-19 DIAGNOSIS — G300 Alzheimer's disease with early onset: Secondary | ICD-10-CM | POA: Diagnosis not present

## 2017-07-19 DIAGNOSIS — F0280 Dementia in other diseases classified elsewhere without behavioral disturbance: Secondary | ICD-10-CM | POA: Diagnosis not present

## 2017-07-19 DIAGNOSIS — M6281 Muscle weakness (generalized): Secondary | ICD-10-CM | POA: Diagnosis not present

## 2017-07-19 DIAGNOSIS — F419 Anxiety disorder, unspecified: Secondary | ICD-10-CM | POA: Diagnosis not present

## 2017-07-19 DIAGNOSIS — R633 Feeding difficulties: Secondary | ICD-10-CM | POA: Diagnosis not present

## 2017-07-20 DIAGNOSIS — F419 Anxiety disorder, unspecified: Secondary | ICD-10-CM | POA: Diagnosis not present

## 2017-07-20 DIAGNOSIS — R633 Feeding difficulties: Secondary | ICD-10-CM | POA: Diagnosis not present

## 2017-07-20 DIAGNOSIS — R41841 Cognitive communication deficit: Secondary | ICD-10-CM | POA: Diagnosis not present

## 2017-07-20 DIAGNOSIS — G20 Parkinson's disease: Secondary | ICD-10-CM | POA: Diagnosis not present

## 2017-07-20 DIAGNOSIS — G300 Alzheimer's disease with early onset: Secondary | ICD-10-CM | POA: Diagnosis not present

## 2017-07-20 DIAGNOSIS — M6281 Muscle weakness (generalized): Secondary | ICD-10-CM | POA: Diagnosis not present

## 2017-07-20 DIAGNOSIS — F0280 Dementia in other diseases classified elsewhere without behavioral disturbance: Secondary | ICD-10-CM | POA: Diagnosis not present

## 2017-07-23 DIAGNOSIS — R41841 Cognitive communication deficit: Secondary | ICD-10-CM | POA: Diagnosis not present

## 2017-07-23 DIAGNOSIS — R633 Feeding difficulties: Secondary | ICD-10-CM | POA: Diagnosis not present

## 2017-07-23 DIAGNOSIS — F419 Anxiety disorder, unspecified: Secondary | ICD-10-CM | POA: Diagnosis not present

## 2017-07-23 DIAGNOSIS — G20 Parkinson's disease: Secondary | ICD-10-CM | POA: Diagnosis not present

## 2017-07-23 DIAGNOSIS — G300 Alzheimer's disease with early onset: Secondary | ICD-10-CM | POA: Diagnosis not present

## 2017-07-23 DIAGNOSIS — M6281 Muscle weakness (generalized): Secondary | ICD-10-CM | POA: Diagnosis not present

## 2017-07-23 DIAGNOSIS — F0280 Dementia in other diseases classified elsewhere without behavioral disturbance: Secondary | ICD-10-CM | POA: Diagnosis not present

## 2017-07-24 DIAGNOSIS — F419 Anxiety disorder, unspecified: Secondary | ICD-10-CM | POA: Diagnosis not present

## 2017-07-24 DIAGNOSIS — M6281 Muscle weakness (generalized): Secondary | ICD-10-CM | POA: Diagnosis not present

## 2017-07-24 DIAGNOSIS — F0280 Dementia in other diseases classified elsewhere without behavioral disturbance: Secondary | ICD-10-CM | POA: Diagnosis not present

## 2017-07-24 DIAGNOSIS — R41841 Cognitive communication deficit: Secondary | ICD-10-CM | POA: Diagnosis not present

## 2017-07-24 DIAGNOSIS — G20 Parkinson's disease: Secondary | ICD-10-CM | POA: Diagnosis not present

## 2017-07-24 DIAGNOSIS — G300 Alzheimer's disease with early onset: Secondary | ICD-10-CM | POA: Diagnosis not present

## 2017-07-24 DIAGNOSIS — R633 Feeding difficulties: Secondary | ICD-10-CM | POA: Diagnosis not present

## 2017-07-25 DIAGNOSIS — G20 Parkinson's disease: Secondary | ICD-10-CM | POA: Diagnosis not present

## 2017-07-25 DIAGNOSIS — F419 Anxiety disorder, unspecified: Secondary | ICD-10-CM | POA: Diagnosis not present

## 2017-07-25 DIAGNOSIS — M6281 Muscle weakness (generalized): Secondary | ICD-10-CM | POA: Diagnosis not present

## 2017-07-25 DIAGNOSIS — G300 Alzheimer's disease with early onset: Secondary | ICD-10-CM | POA: Diagnosis not present

## 2017-07-25 DIAGNOSIS — R41841 Cognitive communication deficit: Secondary | ICD-10-CM | POA: Diagnosis not present

## 2017-07-25 DIAGNOSIS — F0280 Dementia in other diseases classified elsewhere without behavioral disturbance: Secondary | ICD-10-CM | POA: Diagnosis not present

## 2017-07-25 DIAGNOSIS — R633 Feeding difficulties: Secondary | ICD-10-CM | POA: Diagnosis not present

## 2017-07-26 DIAGNOSIS — M6281 Muscle weakness (generalized): Secondary | ICD-10-CM | POA: Diagnosis not present

## 2017-07-26 DIAGNOSIS — R633 Feeding difficulties: Secondary | ICD-10-CM | POA: Diagnosis not present

## 2017-07-26 DIAGNOSIS — G20 Parkinson's disease: Secondary | ICD-10-CM | POA: Diagnosis not present

## 2017-07-26 DIAGNOSIS — F419 Anxiety disorder, unspecified: Secondary | ICD-10-CM | POA: Diagnosis not present

## 2017-07-26 DIAGNOSIS — G300 Alzheimer's disease with early onset: Secondary | ICD-10-CM | POA: Diagnosis not present

## 2017-07-26 DIAGNOSIS — R41841 Cognitive communication deficit: Secondary | ICD-10-CM | POA: Diagnosis not present

## 2017-07-26 DIAGNOSIS — F0280 Dementia in other diseases classified elsewhere without behavioral disturbance: Secondary | ICD-10-CM | POA: Diagnosis not present

## 2017-07-27 DIAGNOSIS — G300 Alzheimer's disease with early onset: Secondary | ICD-10-CM | POA: Diagnosis not present

## 2017-07-27 DIAGNOSIS — M6281 Muscle weakness (generalized): Secondary | ICD-10-CM | POA: Diagnosis not present

## 2017-07-27 DIAGNOSIS — G20 Parkinson's disease: Secondary | ICD-10-CM | POA: Diagnosis not present

## 2017-07-27 DIAGNOSIS — R41841 Cognitive communication deficit: Secondary | ICD-10-CM | POA: Diagnosis not present

## 2017-07-27 DIAGNOSIS — F419 Anxiety disorder, unspecified: Secondary | ICD-10-CM | POA: Diagnosis not present

## 2017-07-27 DIAGNOSIS — R633 Feeding difficulties: Secondary | ICD-10-CM | POA: Diagnosis not present

## 2017-07-27 DIAGNOSIS — F0280 Dementia in other diseases classified elsewhere without behavioral disturbance: Secondary | ICD-10-CM | POA: Diagnosis not present

## 2017-07-28 DIAGNOSIS — R41841 Cognitive communication deficit: Secondary | ICD-10-CM | POA: Diagnosis not present

## 2017-07-28 DIAGNOSIS — F0280 Dementia in other diseases classified elsewhere without behavioral disturbance: Secondary | ICD-10-CM | POA: Diagnosis not present

## 2017-07-28 DIAGNOSIS — G300 Alzheimer's disease with early onset: Secondary | ICD-10-CM | POA: Diagnosis not present

## 2017-07-28 DIAGNOSIS — M6281 Muscle weakness (generalized): Secondary | ICD-10-CM | POA: Diagnosis not present

## 2017-07-28 DIAGNOSIS — G20 Parkinson's disease: Secondary | ICD-10-CM | POA: Diagnosis not present

## 2017-07-28 DIAGNOSIS — R633 Feeding difficulties: Secondary | ICD-10-CM | POA: Diagnosis not present

## 2017-07-28 DIAGNOSIS — F419 Anxiety disorder, unspecified: Secondary | ICD-10-CM | POA: Diagnosis not present

## 2017-07-30 DIAGNOSIS — R633 Feeding difficulties: Secondary | ICD-10-CM | POA: Diagnosis not present

## 2017-07-30 DIAGNOSIS — M6281 Muscle weakness (generalized): Secondary | ICD-10-CM | POA: Diagnosis not present

## 2017-07-30 DIAGNOSIS — F419 Anxiety disorder, unspecified: Secondary | ICD-10-CM | POA: Diagnosis not present

## 2017-07-30 DIAGNOSIS — G300 Alzheimer's disease with early onset: Secondary | ICD-10-CM | POA: Diagnosis not present

## 2017-07-30 DIAGNOSIS — F0280 Dementia in other diseases classified elsewhere without behavioral disturbance: Secondary | ICD-10-CM | POA: Diagnosis not present

## 2017-07-30 DIAGNOSIS — G20 Parkinson's disease: Secondary | ICD-10-CM | POA: Diagnosis not present

## 2017-07-30 DIAGNOSIS — R41841 Cognitive communication deficit: Secondary | ICD-10-CM | POA: Diagnosis not present

## 2017-07-31 DIAGNOSIS — R41841 Cognitive communication deficit: Secondary | ICD-10-CM | POA: Diagnosis not present

## 2017-07-31 DIAGNOSIS — F0280 Dementia in other diseases classified elsewhere without behavioral disturbance: Secondary | ICD-10-CM | POA: Diagnosis not present

## 2017-07-31 DIAGNOSIS — F419 Anxiety disorder, unspecified: Secondary | ICD-10-CM | POA: Diagnosis not present

## 2017-07-31 DIAGNOSIS — G20 Parkinson's disease: Secondary | ICD-10-CM | POA: Diagnosis not present

## 2017-07-31 DIAGNOSIS — R633 Feeding difficulties: Secondary | ICD-10-CM | POA: Diagnosis not present

## 2017-07-31 DIAGNOSIS — G300 Alzheimer's disease with early onset: Secondary | ICD-10-CM | POA: Diagnosis not present

## 2017-07-31 DIAGNOSIS — M6281 Muscle weakness (generalized): Secondary | ICD-10-CM | POA: Diagnosis not present

## 2017-08-01 DIAGNOSIS — R41841 Cognitive communication deficit: Secondary | ICD-10-CM | POA: Diagnosis not present

## 2017-08-01 DIAGNOSIS — R633 Feeding difficulties: Secondary | ICD-10-CM | POA: Diagnosis not present

## 2017-08-01 DIAGNOSIS — F419 Anxiety disorder, unspecified: Secondary | ICD-10-CM | POA: Diagnosis not present

## 2017-08-01 DIAGNOSIS — G20 Parkinson's disease: Secondary | ICD-10-CM | POA: Diagnosis not present

## 2017-08-01 DIAGNOSIS — G300 Alzheimer's disease with early onset: Secondary | ICD-10-CM | POA: Diagnosis not present

## 2017-08-01 DIAGNOSIS — M6281 Muscle weakness (generalized): Secondary | ICD-10-CM | POA: Diagnosis not present

## 2017-08-01 DIAGNOSIS — F0280 Dementia in other diseases classified elsewhere without behavioral disturbance: Secondary | ICD-10-CM | POA: Diagnosis not present

## 2017-08-02 DIAGNOSIS — G20 Parkinson's disease: Secondary | ICD-10-CM | POA: Diagnosis not present

## 2017-08-02 DIAGNOSIS — R41841 Cognitive communication deficit: Secondary | ICD-10-CM | POA: Diagnosis not present

## 2017-08-02 DIAGNOSIS — R633 Feeding difficulties: Secondary | ICD-10-CM | POA: Diagnosis not present

## 2017-08-02 DIAGNOSIS — F419 Anxiety disorder, unspecified: Secondary | ICD-10-CM | POA: Diagnosis not present

## 2017-08-02 DIAGNOSIS — M6281 Muscle weakness (generalized): Secondary | ICD-10-CM | POA: Diagnosis not present

## 2017-08-02 DIAGNOSIS — G300 Alzheimer's disease with early onset: Secondary | ICD-10-CM | POA: Diagnosis not present

## 2017-08-02 DIAGNOSIS — F0280 Dementia in other diseases classified elsewhere without behavioral disturbance: Secondary | ICD-10-CM | POA: Diagnosis not present

## 2017-08-03 DIAGNOSIS — M6281 Muscle weakness (generalized): Secondary | ICD-10-CM | POA: Diagnosis not present

## 2017-08-03 DIAGNOSIS — G20 Parkinson's disease: Secondary | ICD-10-CM | POA: Diagnosis not present

## 2017-08-03 DIAGNOSIS — R633 Feeding difficulties: Secondary | ICD-10-CM | POA: Diagnosis not present

## 2017-08-03 DIAGNOSIS — G300 Alzheimer's disease with early onset: Secondary | ICD-10-CM | POA: Diagnosis not present

## 2017-08-03 DIAGNOSIS — F0280 Dementia in other diseases classified elsewhere without behavioral disturbance: Secondary | ICD-10-CM | POA: Diagnosis not present

## 2017-08-03 DIAGNOSIS — R41841 Cognitive communication deficit: Secondary | ICD-10-CM | POA: Diagnosis not present

## 2017-08-03 DIAGNOSIS — F419 Anxiety disorder, unspecified: Secondary | ICD-10-CM | POA: Diagnosis not present

## 2017-08-05 DIAGNOSIS — F419 Anxiety disorder, unspecified: Secondary | ICD-10-CM | POA: Diagnosis not present

## 2017-08-05 DIAGNOSIS — R633 Feeding difficulties: Secondary | ICD-10-CM | POA: Diagnosis not present

## 2017-08-05 DIAGNOSIS — G300 Alzheimer's disease with early onset: Secondary | ICD-10-CM | POA: Diagnosis not present

## 2017-08-05 DIAGNOSIS — R41841 Cognitive communication deficit: Secondary | ICD-10-CM | POA: Diagnosis not present

## 2017-08-05 DIAGNOSIS — F0280 Dementia in other diseases classified elsewhere without behavioral disturbance: Secondary | ICD-10-CM | POA: Diagnosis not present

## 2017-08-05 DIAGNOSIS — M6281 Muscle weakness (generalized): Secondary | ICD-10-CM | POA: Diagnosis not present

## 2017-08-05 DIAGNOSIS — G20 Parkinson's disease: Secondary | ICD-10-CM | POA: Diagnosis not present

## 2017-08-06 DIAGNOSIS — R633 Feeding difficulties: Secondary | ICD-10-CM | POA: Diagnosis not present

## 2017-08-06 DIAGNOSIS — G20 Parkinson's disease: Secondary | ICD-10-CM | POA: Diagnosis not present

## 2017-08-06 DIAGNOSIS — F0280 Dementia in other diseases classified elsewhere without behavioral disturbance: Secondary | ICD-10-CM | POA: Diagnosis not present

## 2017-08-06 DIAGNOSIS — R41841 Cognitive communication deficit: Secondary | ICD-10-CM | POA: Diagnosis not present

## 2017-08-06 DIAGNOSIS — F419 Anxiety disorder, unspecified: Secondary | ICD-10-CM | POA: Diagnosis not present

## 2017-08-06 DIAGNOSIS — M6281 Muscle weakness (generalized): Secondary | ICD-10-CM | POA: Diagnosis not present

## 2017-08-06 DIAGNOSIS — G300 Alzheimer's disease with early onset: Secondary | ICD-10-CM | POA: Diagnosis not present

## 2017-08-07 DIAGNOSIS — G8929 Other chronic pain: Secondary | ICD-10-CM | POA: Diagnosis not present

## 2017-08-07 DIAGNOSIS — M5136 Other intervertebral disc degeneration, lumbar region: Secondary | ICD-10-CM | POA: Diagnosis not present

## 2017-08-07 DIAGNOSIS — F419 Anxiety disorder, unspecified: Secondary | ICD-10-CM | POA: Diagnosis not present

## 2017-08-07 DIAGNOSIS — F0281 Dementia in other diseases classified elsewhere with behavioral disturbance: Secondary | ICD-10-CM | POA: Diagnosis not present

## 2017-08-08 DIAGNOSIS — F0280 Dementia in other diseases classified elsewhere without behavioral disturbance: Secondary | ICD-10-CM | POA: Diagnosis not present

## 2017-08-08 DIAGNOSIS — M6281 Muscle weakness (generalized): Secondary | ICD-10-CM | POA: Diagnosis not present

## 2017-08-08 DIAGNOSIS — F419 Anxiety disorder, unspecified: Secondary | ICD-10-CM | POA: Diagnosis not present

## 2017-08-08 DIAGNOSIS — R41841 Cognitive communication deficit: Secondary | ICD-10-CM | POA: Diagnosis not present

## 2017-08-08 DIAGNOSIS — G20 Parkinson's disease: Secondary | ICD-10-CM | POA: Diagnosis not present

## 2017-08-08 DIAGNOSIS — G300 Alzheimer's disease with early onset: Secondary | ICD-10-CM | POA: Diagnosis not present

## 2017-08-08 DIAGNOSIS — R633 Feeding difficulties: Secondary | ICD-10-CM | POA: Diagnosis not present

## 2017-08-09 DIAGNOSIS — G300 Alzheimer's disease with early onset: Secondary | ICD-10-CM | POA: Diagnosis not present

## 2017-08-09 DIAGNOSIS — R41841 Cognitive communication deficit: Secondary | ICD-10-CM | POA: Diagnosis not present

## 2017-08-09 DIAGNOSIS — G20 Parkinson's disease: Secondary | ICD-10-CM | POA: Diagnosis not present

## 2017-08-09 DIAGNOSIS — F419 Anxiety disorder, unspecified: Secondary | ICD-10-CM | POA: Diagnosis not present

## 2017-08-09 DIAGNOSIS — M6281 Muscle weakness (generalized): Secondary | ICD-10-CM | POA: Diagnosis not present

## 2017-08-09 DIAGNOSIS — F0280 Dementia in other diseases classified elsewhere without behavioral disturbance: Secondary | ICD-10-CM | POA: Diagnosis not present

## 2017-08-09 DIAGNOSIS — R633 Feeding difficulties: Secondary | ICD-10-CM | POA: Diagnosis not present

## 2017-08-10 DIAGNOSIS — R41841 Cognitive communication deficit: Secondary | ICD-10-CM | POA: Diagnosis not present

## 2017-08-10 DIAGNOSIS — G300 Alzheimer's disease with early onset: Secondary | ICD-10-CM | POA: Diagnosis not present

## 2017-08-10 DIAGNOSIS — M6281 Muscle weakness (generalized): Secondary | ICD-10-CM | POA: Diagnosis not present

## 2017-08-10 DIAGNOSIS — G20 Parkinson's disease: Secondary | ICD-10-CM | POA: Diagnosis not present

## 2017-08-10 DIAGNOSIS — R633 Feeding difficulties: Secondary | ICD-10-CM | POA: Diagnosis not present

## 2017-08-10 DIAGNOSIS — F0280 Dementia in other diseases classified elsewhere without behavioral disturbance: Secondary | ICD-10-CM | POA: Diagnosis not present

## 2017-08-10 DIAGNOSIS — F419 Anxiety disorder, unspecified: Secondary | ICD-10-CM | POA: Diagnosis not present

## 2017-08-13 DIAGNOSIS — M6281 Muscle weakness (generalized): Secondary | ICD-10-CM | POA: Diagnosis not present

## 2017-08-13 DIAGNOSIS — R41841 Cognitive communication deficit: Secondary | ICD-10-CM | POA: Diagnosis not present

## 2017-08-13 DIAGNOSIS — F0280 Dementia in other diseases classified elsewhere without behavioral disturbance: Secondary | ICD-10-CM | POA: Diagnosis not present

## 2017-08-13 DIAGNOSIS — R633 Feeding difficulties: Secondary | ICD-10-CM | POA: Diagnosis not present

## 2017-08-13 DIAGNOSIS — F419 Anxiety disorder, unspecified: Secondary | ICD-10-CM | POA: Diagnosis not present

## 2017-08-13 DIAGNOSIS — G300 Alzheimer's disease with early onset: Secondary | ICD-10-CM | POA: Diagnosis not present

## 2017-08-13 DIAGNOSIS — G20 Parkinson's disease: Secondary | ICD-10-CM | POA: Diagnosis not present

## 2017-08-14 DIAGNOSIS — N3281 Overactive bladder: Secondary | ICD-10-CM | POA: Diagnosis not present

## 2017-08-14 DIAGNOSIS — F419 Anxiety disorder, unspecified: Secondary | ICD-10-CM | POA: Diagnosis not present

## 2017-08-14 DIAGNOSIS — F0280 Dementia in other diseases classified elsewhere without behavioral disturbance: Secondary | ICD-10-CM | POA: Diagnosis not present

## 2017-08-14 DIAGNOSIS — G8929 Other chronic pain: Secondary | ICD-10-CM | POA: Diagnosis not present

## 2017-08-14 DIAGNOSIS — R633 Feeding difficulties: Secondary | ICD-10-CM | POA: Diagnosis not present

## 2017-08-14 DIAGNOSIS — R41841 Cognitive communication deficit: Secondary | ICD-10-CM | POA: Diagnosis not present

## 2017-08-14 DIAGNOSIS — G20 Parkinson's disease: Secondary | ICD-10-CM | POA: Diagnosis not present

## 2017-08-14 DIAGNOSIS — M6281 Muscle weakness (generalized): Secondary | ICD-10-CM | POA: Diagnosis not present

## 2017-08-14 DIAGNOSIS — G300 Alzheimer's disease with early onset: Secondary | ICD-10-CM | POA: Diagnosis not present

## 2017-08-14 DIAGNOSIS — I10 Essential (primary) hypertension: Secondary | ICD-10-CM | POA: Diagnosis not present

## 2017-08-15 DIAGNOSIS — F0280 Dementia in other diseases classified elsewhere without behavioral disturbance: Secondary | ICD-10-CM | POA: Diagnosis not present

## 2017-08-15 DIAGNOSIS — M6281 Muscle weakness (generalized): Secondary | ICD-10-CM | POA: Diagnosis not present

## 2017-08-15 DIAGNOSIS — R41841 Cognitive communication deficit: Secondary | ICD-10-CM | POA: Diagnosis not present

## 2017-08-15 DIAGNOSIS — R633 Feeding difficulties: Secondary | ICD-10-CM | POA: Diagnosis not present

## 2017-08-15 DIAGNOSIS — G300 Alzheimer's disease with early onset: Secondary | ICD-10-CM | POA: Diagnosis not present

## 2017-08-15 DIAGNOSIS — F419 Anxiety disorder, unspecified: Secondary | ICD-10-CM | POA: Diagnosis not present

## 2017-08-15 DIAGNOSIS — G20 Parkinson's disease: Secondary | ICD-10-CM | POA: Diagnosis not present

## 2017-08-16 DIAGNOSIS — M6281 Muscle weakness (generalized): Secondary | ICD-10-CM | POA: Diagnosis not present

## 2017-08-16 DIAGNOSIS — R319 Hematuria, unspecified: Secondary | ICD-10-CM | POA: Diagnosis not present

## 2017-08-16 DIAGNOSIS — G20 Parkinson's disease: Secondary | ICD-10-CM | POA: Diagnosis not present

## 2017-08-16 DIAGNOSIS — F0280 Dementia in other diseases classified elsewhere without behavioral disturbance: Secondary | ICD-10-CM | POA: Diagnosis not present

## 2017-08-16 DIAGNOSIS — Z79899 Other long term (current) drug therapy: Secondary | ICD-10-CM | POA: Diagnosis not present

## 2017-08-16 DIAGNOSIS — F419 Anxiety disorder, unspecified: Secondary | ICD-10-CM | POA: Diagnosis not present

## 2017-08-16 DIAGNOSIS — R633 Feeding difficulties: Secondary | ICD-10-CM | POA: Diagnosis not present

## 2017-08-16 DIAGNOSIS — G300 Alzheimer's disease with early onset: Secondary | ICD-10-CM | POA: Diagnosis not present

## 2017-08-16 DIAGNOSIS — N390 Urinary tract infection, site not specified: Secondary | ICD-10-CM | POA: Diagnosis not present

## 2017-08-16 DIAGNOSIS — R41841 Cognitive communication deficit: Secondary | ICD-10-CM | POA: Diagnosis not present

## 2017-08-20 DIAGNOSIS — G300 Alzheimer's disease with early onset: Secondary | ICD-10-CM | POA: Diagnosis not present

## 2017-08-20 DIAGNOSIS — G20 Parkinson's disease: Secondary | ICD-10-CM | POA: Diagnosis not present

## 2017-08-20 DIAGNOSIS — F0280 Dementia in other diseases classified elsewhere without behavioral disturbance: Secondary | ICD-10-CM | POA: Diagnosis not present

## 2017-08-20 DIAGNOSIS — R41841 Cognitive communication deficit: Secondary | ICD-10-CM | POA: Diagnosis not present

## 2017-08-21 DIAGNOSIS — G300 Alzheimer's disease with early onset: Secondary | ICD-10-CM | POA: Diagnosis not present

## 2017-08-21 DIAGNOSIS — R41841 Cognitive communication deficit: Secondary | ICD-10-CM | POA: Diagnosis not present

## 2017-08-21 DIAGNOSIS — G20 Parkinson's disease: Secondary | ICD-10-CM | POA: Diagnosis not present

## 2017-08-21 DIAGNOSIS — F0280 Dementia in other diseases classified elsewhere without behavioral disturbance: Secondary | ICD-10-CM | POA: Diagnosis not present

## 2017-08-22 DIAGNOSIS — R41841 Cognitive communication deficit: Secondary | ICD-10-CM | POA: Diagnosis not present

## 2017-08-22 DIAGNOSIS — F0280 Dementia in other diseases classified elsewhere without behavioral disturbance: Secondary | ICD-10-CM | POA: Diagnosis not present

## 2017-08-22 DIAGNOSIS — G300 Alzheimer's disease with early onset: Secondary | ICD-10-CM | POA: Diagnosis not present

## 2017-08-22 DIAGNOSIS — G20 Parkinson's disease: Secondary | ICD-10-CM | POA: Diagnosis not present

## 2017-08-23 DIAGNOSIS — F0280 Dementia in other diseases classified elsewhere without behavioral disturbance: Secondary | ICD-10-CM | POA: Diagnosis not present

## 2017-08-23 DIAGNOSIS — R41841 Cognitive communication deficit: Secondary | ICD-10-CM | POA: Diagnosis not present

## 2017-08-23 DIAGNOSIS — G20 Parkinson's disease: Secondary | ICD-10-CM | POA: Diagnosis not present

## 2017-08-23 DIAGNOSIS — G300 Alzheimer's disease with early onset: Secondary | ICD-10-CM | POA: Diagnosis not present

## 2017-08-26 DIAGNOSIS — G20 Parkinson's disease: Secondary | ICD-10-CM | POA: Diagnosis not present

## 2017-08-26 DIAGNOSIS — F0280 Dementia in other diseases classified elsewhere without behavioral disturbance: Secondary | ICD-10-CM | POA: Diagnosis not present

## 2017-08-26 DIAGNOSIS — R41841 Cognitive communication deficit: Secondary | ICD-10-CM | POA: Diagnosis not present

## 2017-08-26 DIAGNOSIS — G300 Alzheimer's disease with early onset: Secondary | ICD-10-CM | POA: Diagnosis not present

## 2017-08-27 DIAGNOSIS — F0280 Dementia in other diseases classified elsewhere without behavioral disturbance: Secondary | ICD-10-CM | POA: Diagnosis not present

## 2017-08-27 DIAGNOSIS — R41841 Cognitive communication deficit: Secondary | ICD-10-CM | POA: Diagnosis not present

## 2017-08-27 DIAGNOSIS — G300 Alzheimer's disease with early onset: Secondary | ICD-10-CM | POA: Diagnosis not present

## 2017-08-27 DIAGNOSIS — G20 Parkinson's disease: Secondary | ICD-10-CM | POA: Diagnosis not present

## 2017-08-28 DIAGNOSIS — F0280 Dementia in other diseases classified elsewhere without behavioral disturbance: Secondary | ICD-10-CM | POA: Diagnosis not present

## 2017-08-28 DIAGNOSIS — R41841 Cognitive communication deficit: Secondary | ICD-10-CM | POA: Diagnosis not present

## 2017-08-28 DIAGNOSIS — G300 Alzheimer's disease with early onset: Secondary | ICD-10-CM | POA: Diagnosis not present

## 2017-08-28 DIAGNOSIS — G20 Parkinson's disease: Secondary | ICD-10-CM | POA: Diagnosis not present

## 2017-08-29 DIAGNOSIS — G300 Alzheimer's disease with early onset: Secondary | ICD-10-CM | POA: Diagnosis not present

## 2017-08-29 DIAGNOSIS — G20 Parkinson's disease: Secondary | ICD-10-CM | POA: Diagnosis not present

## 2017-08-29 DIAGNOSIS — F0280 Dementia in other diseases classified elsewhere without behavioral disturbance: Secondary | ICD-10-CM | POA: Diagnosis not present

## 2017-08-29 DIAGNOSIS — R41841 Cognitive communication deficit: Secondary | ICD-10-CM | POA: Diagnosis not present

## 2017-09-02 DIAGNOSIS — R41841 Cognitive communication deficit: Secondary | ICD-10-CM | POA: Diagnosis not present

## 2017-09-02 DIAGNOSIS — G300 Alzheimer's disease with early onset: Secondary | ICD-10-CM | POA: Diagnosis not present

## 2017-09-02 DIAGNOSIS — G20 Parkinson's disease: Secondary | ICD-10-CM | POA: Diagnosis not present

## 2017-09-02 DIAGNOSIS — F0280 Dementia in other diseases classified elsewhere without behavioral disturbance: Secondary | ICD-10-CM | POA: Diagnosis not present

## 2017-09-03 DIAGNOSIS — G20 Parkinson's disease: Secondary | ICD-10-CM | POA: Diagnosis not present

## 2017-09-03 DIAGNOSIS — R41841 Cognitive communication deficit: Secondary | ICD-10-CM | POA: Diagnosis not present

## 2017-09-03 DIAGNOSIS — G300 Alzheimer's disease with early onset: Secondary | ICD-10-CM | POA: Diagnosis not present

## 2017-09-03 DIAGNOSIS — F0280 Dementia in other diseases classified elsewhere without behavioral disturbance: Secondary | ICD-10-CM | POA: Diagnosis not present

## 2017-09-04 DIAGNOSIS — R41841 Cognitive communication deficit: Secondary | ICD-10-CM | POA: Diagnosis not present

## 2017-09-04 DIAGNOSIS — F0280 Dementia in other diseases classified elsewhere without behavioral disturbance: Secondary | ICD-10-CM | POA: Diagnosis not present

## 2017-09-04 DIAGNOSIS — G300 Alzheimer's disease with early onset: Secondary | ICD-10-CM | POA: Diagnosis not present

## 2017-09-04 DIAGNOSIS — G20 Parkinson's disease: Secondary | ICD-10-CM | POA: Diagnosis not present

## 2017-09-05 DIAGNOSIS — G20 Parkinson's disease: Secondary | ICD-10-CM | POA: Diagnosis not present

## 2017-09-05 DIAGNOSIS — R41841 Cognitive communication deficit: Secondary | ICD-10-CM | POA: Diagnosis not present

## 2017-09-05 DIAGNOSIS — F0280 Dementia in other diseases classified elsewhere without behavioral disturbance: Secondary | ICD-10-CM | POA: Diagnosis not present

## 2017-09-05 DIAGNOSIS — G300 Alzheimer's disease with early onset: Secondary | ICD-10-CM | POA: Diagnosis not present

## 2017-09-06 DIAGNOSIS — F0280 Dementia in other diseases classified elsewhere without behavioral disturbance: Secondary | ICD-10-CM | POA: Diagnosis not present

## 2017-09-06 DIAGNOSIS — M5136 Other intervertebral disc degeneration, lumbar region: Secondary | ICD-10-CM | POA: Diagnosis not present

## 2017-09-06 DIAGNOSIS — I10 Essential (primary) hypertension: Secondary | ICD-10-CM | POA: Diagnosis not present

## 2017-09-06 DIAGNOSIS — G20 Parkinson's disease: Secondary | ICD-10-CM | POA: Diagnosis not present

## 2017-09-06 DIAGNOSIS — R41841 Cognitive communication deficit: Secondary | ICD-10-CM | POA: Diagnosis not present

## 2017-09-06 DIAGNOSIS — G300 Alzheimer's disease with early onset: Secondary | ICD-10-CM | POA: Diagnosis not present

## 2017-09-06 DIAGNOSIS — E039 Hypothyroidism, unspecified: Secondary | ICD-10-CM | POA: Diagnosis not present

## 2017-09-09 DIAGNOSIS — G20 Parkinson's disease: Secondary | ICD-10-CM | POA: Diagnosis not present

## 2017-09-09 DIAGNOSIS — F0280 Dementia in other diseases classified elsewhere without behavioral disturbance: Secondary | ICD-10-CM | POA: Diagnosis not present

## 2017-09-09 DIAGNOSIS — G300 Alzheimer's disease with early onset: Secondary | ICD-10-CM | POA: Diagnosis not present

## 2017-09-09 DIAGNOSIS — R41841 Cognitive communication deficit: Secondary | ICD-10-CM | POA: Diagnosis not present

## 2017-09-10 DIAGNOSIS — G300 Alzheimer's disease with early onset: Secondary | ICD-10-CM | POA: Diagnosis not present

## 2017-09-10 DIAGNOSIS — R41841 Cognitive communication deficit: Secondary | ICD-10-CM | POA: Diagnosis not present

## 2017-09-10 DIAGNOSIS — G20 Parkinson's disease: Secondary | ICD-10-CM | POA: Diagnosis not present

## 2017-09-10 DIAGNOSIS — F0280 Dementia in other diseases classified elsewhere without behavioral disturbance: Secondary | ICD-10-CM | POA: Diagnosis not present

## 2017-09-10 DIAGNOSIS — I10 Essential (primary) hypertension: Secondary | ICD-10-CM | POA: Diagnosis not present

## 2017-09-10 DIAGNOSIS — F0281 Dementia in other diseases classified elsewhere with behavioral disturbance: Secondary | ICD-10-CM | POA: Diagnosis not present

## 2017-09-10 DIAGNOSIS — E039 Hypothyroidism, unspecified: Secondary | ICD-10-CM | POA: Diagnosis not present

## 2017-09-11 DIAGNOSIS — G20 Parkinson's disease: Secondary | ICD-10-CM | POA: Diagnosis not present

## 2017-09-11 DIAGNOSIS — G300 Alzheimer's disease with early onset: Secondary | ICD-10-CM | POA: Diagnosis not present

## 2017-09-11 DIAGNOSIS — R41841 Cognitive communication deficit: Secondary | ICD-10-CM | POA: Diagnosis not present

## 2017-09-11 DIAGNOSIS — F0280 Dementia in other diseases classified elsewhere without behavioral disturbance: Secondary | ICD-10-CM | POA: Diagnosis not present

## 2017-09-12 DIAGNOSIS — G20 Parkinson's disease: Secondary | ICD-10-CM | POA: Diagnosis not present

## 2017-09-12 DIAGNOSIS — R350 Frequency of micturition: Secondary | ICD-10-CM | POA: Diagnosis not present

## 2017-09-12 DIAGNOSIS — N3944 Nocturnal enuresis: Secondary | ICD-10-CM | POA: Diagnosis not present

## 2017-09-12 DIAGNOSIS — N3946 Mixed incontinence: Secondary | ICD-10-CM | POA: Diagnosis not present

## 2017-09-12 DIAGNOSIS — R351 Nocturia: Secondary | ICD-10-CM | POA: Diagnosis not present

## 2017-09-12 DIAGNOSIS — F0280 Dementia in other diseases classified elsewhere without behavioral disturbance: Secondary | ICD-10-CM | POA: Diagnosis not present

## 2017-09-12 DIAGNOSIS — G300 Alzheimer's disease with early onset: Secondary | ICD-10-CM | POA: Diagnosis not present

## 2017-09-12 DIAGNOSIS — R41841 Cognitive communication deficit: Secondary | ICD-10-CM | POA: Diagnosis not present

## 2017-09-12 DIAGNOSIS — N390 Urinary tract infection, site not specified: Secondary | ICD-10-CM | POA: Diagnosis not present

## 2017-09-13 DIAGNOSIS — G20 Parkinson's disease: Secondary | ICD-10-CM | POA: Diagnosis not present

## 2017-09-13 DIAGNOSIS — R41841 Cognitive communication deficit: Secondary | ICD-10-CM | POA: Diagnosis not present

## 2017-09-13 DIAGNOSIS — F0280 Dementia in other diseases classified elsewhere without behavioral disturbance: Secondary | ICD-10-CM | POA: Diagnosis not present

## 2017-09-13 DIAGNOSIS — G300 Alzheimer's disease with early onset: Secondary | ICD-10-CM | POA: Diagnosis not present

## 2017-09-16 DIAGNOSIS — F0280 Dementia in other diseases classified elsewhere without behavioral disturbance: Secondary | ICD-10-CM | POA: Diagnosis not present

## 2017-09-16 DIAGNOSIS — G300 Alzheimer's disease with early onset: Secondary | ICD-10-CM | POA: Diagnosis not present

## 2017-09-16 DIAGNOSIS — G20 Parkinson's disease: Secondary | ICD-10-CM | POA: Diagnosis not present

## 2017-09-16 DIAGNOSIS — R41841 Cognitive communication deficit: Secondary | ICD-10-CM | POA: Diagnosis not present

## 2017-09-17 DIAGNOSIS — G20 Parkinson's disease: Secondary | ICD-10-CM | POA: Diagnosis not present

## 2017-09-17 DIAGNOSIS — G300 Alzheimer's disease with early onset: Secondary | ICD-10-CM | POA: Diagnosis not present

## 2017-09-17 DIAGNOSIS — R41841 Cognitive communication deficit: Secondary | ICD-10-CM | POA: Diagnosis not present

## 2017-09-17 DIAGNOSIS — F0280 Dementia in other diseases classified elsewhere without behavioral disturbance: Secondary | ICD-10-CM | POA: Diagnosis not present

## 2017-10-04 DIAGNOSIS — F0281 Dementia in other diseases classified elsewhere with behavioral disturbance: Secondary | ICD-10-CM | POA: Diagnosis not present

## 2017-10-04 DIAGNOSIS — I10 Essential (primary) hypertension: Secondary | ICD-10-CM | POA: Diagnosis not present

## 2017-10-04 DIAGNOSIS — M5136 Other intervertebral disc degeneration, lumbar region: Secondary | ICD-10-CM | POA: Diagnosis not present

## 2017-10-04 DIAGNOSIS — F419 Anxiety disorder, unspecified: Secondary | ICD-10-CM | POA: Diagnosis not present

## 2017-10-08 DIAGNOSIS — G20 Parkinson's disease: Secondary | ICD-10-CM | POA: Diagnosis not present

## 2017-10-08 DIAGNOSIS — N390 Urinary tract infection, site not specified: Secondary | ICD-10-CM | POA: Diagnosis not present

## 2017-10-08 DIAGNOSIS — E039 Hypothyroidism, unspecified: Secondary | ICD-10-CM | POA: Diagnosis not present

## 2017-10-08 DIAGNOSIS — I10 Essential (primary) hypertension: Secondary | ICD-10-CM | POA: Diagnosis not present

## 2017-10-24 DIAGNOSIS — N3946 Mixed incontinence: Secondary | ICD-10-CM | POA: Diagnosis not present

## 2017-10-24 DIAGNOSIS — N3010 Interstitial cystitis (chronic) without hematuria: Secondary | ICD-10-CM | POA: Diagnosis not present

## 2017-10-24 DIAGNOSIS — R350 Frequency of micturition: Secondary | ICD-10-CM | POA: Diagnosis not present

## 2017-10-24 DIAGNOSIS — N390 Urinary tract infection, site not specified: Secondary | ICD-10-CM | POA: Diagnosis not present

## 2017-10-25 DIAGNOSIS — R293 Abnormal posture: Secondary | ICD-10-CM | POA: Diagnosis not present

## 2017-10-25 DIAGNOSIS — M5136 Other intervertebral disc degeneration, lumbar region: Secondary | ICD-10-CM | POA: Diagnosis not present

## 2017-10-25 DIAGNOSIS — F319 Bipolar disorder, unspecified: Secondary | ICD-10-CM | POA: Diagnosis not present

## 2017-10-25 DIAGNOSIS — G20 Parkinson's disease: Secondary | ICD-10-CM | POA: Diagnosis not present

## 2017-10-25 DIAGNOSIS — G300 Alzheimer's disease with early onset: Secondary | ICD-10-CM | POA: Diagnosis not present

## 2017-10-25 DIAGNOSIS — I10 Essential (primary) hypertension: Secondary | ICD-10-CM | POA: Diagnosis not present

## 2017-10-25 DIAGNOSIS — R262 Difficulty in walking, not elsewhere classified: Secondary | ICD-10-CM | POA: Diagnosis not present

## 2017-10-25 DIAGNOSIS — R41841 Cognitive communication deficit: Secondary | ICD-10-CM | POA: Diagnosis not present

## 2017-10-26 DIAGNOSIS — I10 Essential (primary) hypertension: Secondary | ICD-10-CM | POA: Diagnosis not present

## 2017-10-26 DIAGNOSIS — F319 Bipolar disorder, unspecified: Secondary | ICD-10-CM | POA: Diagnosis not present

## 2017-10-26 DIAGNOSIS — M5136 Other intervertebral disc degeneration, lumbar region: Secondary | ICD-10-CM | POA: Diagnosis not present

## 2017-10-26 DIAGNOSIS — R41841 Cognitive communication deficit: Secondary | ICD-10-CM | POA: Diagnosis not present

## 2017-10-26 DIAGNOSIS — R293 Abnormal posture: Secondary | ICD-10-CM | POA: Diagnosis not present

## 2017-10-26 DIAGNOSIS — R262 Difficulty in walking, not elsewhere classified: Secondary | ICD-10-CM | POA: Diagnosis not present

## 2017-10-26 DIAGNOSIS — G300 Alzheimer's disease with early onset: Secondary | ICD-10-CM | POA: Diagnosis not present

## 2017-10-26 DIAGNOSIS — G20 Parkinson's disease: Secondary | ICD-10-CM | POA: Diagnosis not present

## 2017-10-29 DIAGNOSIS — R293 Abnormal posture: Secondary | ICD-10-CM | POA: Diagnosis not present

## 2017-10-29 DIAGNOSIS — G20 Parkinson's disease: Secondary | ICD-10-CM | POA: Diagnosis not present

## 2017-10-29 DIAGNOSIS — M5136 Other intervertebral disc degeneration, lumbar region: Secondary | ICD-10-CM | POA: Diagnosis not present

## 2017-10-29 DIAGNOSIS — G300 Alzheimer's disease with early onset: Secondary | ICD-10-CM | POA: Diagnosis not present

## 2017-10-29 DIAGNOSIS — F319 Bipolar disorder, unspecified: Secondary | ICD-10-CM | POA: Diagnosis not present

## 2017-10-29 DIAGNOSIS — R41841 Cognitive communication deficit: Secondary | ICD-10-CM | POA: Diagnosis not present

## 2017-10-29 DIAGNOSIS — R262 Difficulty in walking, not elsewhere classified: Secondary | ICD-10-CM | POA: Diagnosis not present

## 2017-10-29 DIAGNOSIS — I10 Essential (primary) hypertension: Secondary | ICD-10-CM | POA: Diagnosis not present

## 2017-10-30 DIAGNOSIS — I10 Essential (primary) hypertension: Secondary | ICD-10-CM | POA: Diagnosis not present

## 2017-10-30 DIAGNOSIS — R293 Abnormal posture: Secondary | ICD-10-CM | POA: Diagnosis not present

## 2017-10-30 DIAGNOSIS — R262 Difficulty in walking, not elsewhere classified: Secondary | ICD-10-CM | POA: Diagnosis not present

## 2017-10-30 DIAGNOSIS — G300 Alzheimer's disease with early onset: Secondary | ICD-10-CM | POA: Diagnosis not present

## 2017-10-30 DIAGNOSIS — G20 Parkinson's disease: Secondary | ICD-10-CM | POA: Diagnosis not present

## 2017-10-30 DIAGNOSIS — M5136 Other intervertebral disc degeneration, lumbar region: Secondary | ICD-10-CM | POA: Diagnosis not present

## 2017-10-30 DIAGNOSIS — R41841 Cognitive communication deficit: Secondary | ICD-10-CM | POA: Diagnosis not present

## 2017-10-30 DIAGNOSIS — F319 Bipolar disorder, unspecified: Secondary | ICD-10-CM | POA: Diagnosis not present

## 2017-10-31 DIAGNOSIS — R41841 Cognitive communication deficit: Secondary | ICD-10-CM | POA: Diagnosis not present

## 2017-10-31 DIAGNOSIS — F319 Bipolar disorder, unspecified: Secondary | ICD-10-CM | POA: Diagnosis not present

## 2017-10-31 DIAGNOSIS — R262 Difficulty in walking, not elsewhere classified: Secondary | ICD-10-CM | POA: Diagnosis not present

## 2017-10-31 DIAGNOSIS — G300 Alzheimer's disease with early onset: Secondary | ICD-10-CM | POA: Diagnosis not present

## 2017-10-31 DIAGNOSIS — I10 Essential (primary) hypertension: Secondary | ICD-10-CM | POA: Diagnosis not present

## 2017-10-31 DIAGNOSIS — M5136 Other intervertebral disc degeneration, lumbar region: Secondary | ICD-10-CM | POA: Diagnosis not present

## 2017-10-31 DIAGNOSIS — G20 Parkinson's disease: Secondary | ICD-10-CM | POA: Diagnosis not present

## 2017-10-31 DIAGNOSIS — R293 Abnormal posture: Secondary | ICD-10-CM | POA: Diagnosis not present

## 2017-11-01 DIAGNOSIS — F0281 Dementia in other diseases classified elsewhere with behavioral disturbance: Secondary | ICD-10-CM | POA: Diagnosis not present

## 2017-11-01 DIAGNOSIS — F319 Bipolar disorder, unspecified: Secondary | ICD-10-CM | POA: Diagnosis not present

## 2017-11-01 DIAGNOSIS — G20 Parkinson's disease: Secondary | ICD-10-CM | POA: Diagnosis not present

## 2017-11-01 DIAGNOSIS — G300 Alzheimer's disease with early onset: Secondary | ICD-10-CM | POA: Diagnosis not present

## 2017-11-01 DIAGNOSIS — M5136 Other intervertebral disc degeneration, lumbar region: Secondary | ICD-10-CM | POA: Diagnosis not present

## 2017-11-01 DIAGNOSIS — E782 Mixed hyperlipidemia: Secondary | ICD-10-CM | POA: Diagnosis not present

## 2017-11-01 DIAGNOSIS — R41841 Cognitive communication deficit: Secondary | ICD-10-CM | POA: Diagnosis not present

## 2017-11-01 DIAGNOSIS — I10 Essential (primary) hypertension: Secondary | ICD-10-CM | POA: Diagnosis not present

## 2017-11-01 DIAGNOSIS — R262 Difficulty in walking, not elsewhere classified: Secondary | ICD-10-CM | POA: Diagnosis not present

## 2017-11-01 DIAGNOSIS — R293 Abnormal posture: Secondary | ICD-10-CM | POA: Diagnosis not present

## 2017-11-02 DIAGNOSIS — R41841 Cognitive communication deficit: Secondary | ICD-10-CM | POA: Diagnosis not present

## 2017-11-02 DIAGNOSIS — F319 Bipolar disorder, unspecified: Secondary | ICD-10-CM | POA: Diagnosis not present

## 2017-11-02 DIAGNOSIS — R293 Abnormal posture: Secondary | ICD-10-CM | POA: Diagnosis not present

## 2017-11-02 DIAGNOSIS — G300 Alzheimer's disease with early onset: Secondary | ICD-10-CM | POA: Diagnosis not present

## 2017-11-02 DIAGNOSIS — R262 Difficulty in walking, not elsewhere classified: Secondary | ICD-10-CM | POA: Diagnosis not present

## 2017-11-02 DIAGNOSIS — G20 Parkinson's disease: Secondary | ICD-10-CM | POA: Diagnosis not present

## 2017-11-02 DIAGNOSIS — M5136 Other intervertebral disc degeneration, lumbar region: Secondary | ICD-10-CM | POA: Diagnosis not present

## 2017-11-02 DIAGNOSIS — I10 Essential (primary) hypertension: Secondary | ICD-10-CM | POA: Diagnosis not present

## 2017-11-05 DIAGNOSIS — R41841 Cognitive communication deficit: Secondary | ICD-10-CM | POA: Diagnosis not present

## 2017-11-05 DIAGNOSIS — R262 Difficulty in walking, not elsewhere classified: Secondary | ICD-10-CM | POA: Diagnosis not present

## 2017-11-05 DIAGNOSIS — R293 Abnormal posture: Secondary | ICD-10-CM | POA: Diagnosis not present

## 2017-11-05 DIAGNOSIS — F319 Bipolar disorder, unspecified: Secondary | ICD-10-CM | POA: Diagnosis not present

## 2017-11-05 DIAGNOSIS — M5136 Other intervertebral disc degeneration, lumbar region: Secondary | ICD-10-CM | POA: Diagnosis not present

## 2017-11-05 DIAGNOSIS — G20 Parkinson's disease: Secondary | ICD-10-CM | POA: Diagnosis not present

## 2017-11-05 DIAGNOSIS — G300 Alzheimer's disease with early onset: Secondary | ICD-10-CM | POA: Diagnosis not present

## 2017-11-05 DIAGNOSIS — I10 Essential (primary) hypertension: Secondary | ICD-10-CM | POA: Diagnosis not present

## 2017-11-06 DIAGNOSIS — F319 Bipolar disorder, unspecified: Secondary | ICD-10-CM | POA: Diagnosis not present

## 2017-11-06 DIAGNOSIS — I10 Essential (primary) hypertension: Secondary | ICD-10-CM | POA: Diagnosis not present

## 2017-11-06 DIAGNOSIS — G20 Parkinson's disease: Secondary | ICD-10-CM | POA: Diagnosis not present

## 2017-11-06 DIAGNOSIS — R262 Difficulty in walking, not elsewhere classified: Secondary | ICD-10-CM | POA: Diagnosis not present

## 2017-11-06 DIAGNOSIS — R293 Abnormal posture: Secondary | ICD-10-CM | POA: Diagnosis not present

## 2017-11-06 DIAGNOSIS — M5136 Other intervertebral disc degeneration, lumbar region: Secondary | ICD-10-CM | POA: Diagnosis not present

## 2017-11-06 DIAGNOSIS — R41841 Cognitive communication deficit: Secondary | ICD-10-CM | POA: Diagnosis not present

## 2017-11-06 DIAGNOSIS — G300 Alzheimer's disease with early onset: Secondary | ICD-10-CM | POA: Diagnosis not present

## 2017-11-07 DIAGNOSIS — G20 Parkinson's disease: Secondary | ICD-10-CM | POA: Diagnosis not present

## 2017-11-07 DIAGNOSIS — F319 Bipolar disorder, unspecified: Secondary | ICD-10-CM | POA: Diagnosis not present

## 2017-11-07 DIAGNOSIS — R41841 Cognitive communication deficit: Secondary | ICD-10-CM | POA: Diagnosis not present

## 2017-11-07 DIAGNOSIS — I10 Essential (primary) hypertension: Secondary | ICD-10-CM | POA: Diagnosis not present

## 2017-11-07 DIAGNOSIS — R262 Difficulty in walking, not elsewhere classified: Secondary | ICD-10-CM | POA: Diagnosis not present

## 2017-11-07 DIAGNOSIS — G300 Alzheimer's disease with early onset: Secondary | ICD-10-CM | POA: Diagnosis not present

## 2017-11-07 DIAGNOSIS — M5136 Other intervertebral disc degeneration, lumbar region: Secondary | ICD-10-CM | POA: Diagnosis not present

## 2017-11-07 DIAGNOSIS — R293 Abnormal posture: Secondary | ICD-10-CM | POA: Diagnosis not present

## 2017-11-08 DIAGNOSIS — G300 Alzheimer's disease with early onset: Secondary | ICD-10-CM | POA: Diagnosis not present

## 2017-11-08 DIAGNOSIS — G20 Parkinson's disease: Secondary | ICD-10-CM | POA: Diagnosis not present

## 2017-11-08 DIAGNOSIS — R262 Difficulty in walking, not elsewhere classified: Secondary | ICD-10-CM | POA: Diagnosis not present

## 2017-11-08 DIAGNOSIS — I10 Essential (primary) hypertension: Secondary | ICD-10-CM | POA: Diagnosis not present

## 2017-11-08 DIAGNOSIS — R41841 Cognitive communication deficit: Secondary | ICD-10-CM | POA: Diagnosis not present

## 2017-11-08 DIAGNOSIS — R293 Abnormal posture: Secondary | ICD-10-CM | POA: Diagnosis not present

## 2017-11-08 DIAGNOSIS — F319 Bipolar disorder, unspecified: Secondary | ICD-10-CM | POA: Diagnosis not present

## 2017-11-08 DIAGNOSIS — M5136 Other intervertebral disc degeneration, lumbar region: Secondary | ICD-10-CM | POA: Diagnosis not present

## 2017-11-09 DIAGNOSIS — I10 Essential (primary) hypertension: Secondary | ICD-10-CM | POA: Diagnosis not present

## 2017-11-09 DIAGNOSIS — R41841 Cognitive communication deficit: Secondary | ICD-10-CM | POA: Diagnosis not present

## 2017-11-09 DIAGNOSIS — R262 Difficulty in walking, not elsewhere classified: Secondary | ICD-10-CM | POA: Diagnosis not present

## 2017-11-09 DIAGNOSIS — M5136 Other intervertebral disc degeneration, lumbar region: Secondary | ICD-10-CM | POA: Diagnosis not present

## 2017-11-09 DIAGNOSIS — G20 Parkinson's disease: Secondary | ICD-10-CM | POA: Diagnosis not present

## 2017-11-09 DIAGNOSIS — F319 Bipolar disorder, unspecified: Secondary | ICD-10-CM | POA: Diagnosis not present

## 2017-11-09 DIAGNOSIS — G300 Alzheimer's disease with early onset: Secondary | ICD-10-CM | POA: Diagnosis not present

## 2017-11-09 DIAGNOSIS — R293 Abnormal posture: Secondary | ICD-10-CM | POA: Diagnosis not present

## 2017-11-12 DIAGNOSIS — R293 Abnormal posture: Secondary | ICD-10-CM | POA: Diagnosis not present

## 2017-11-12 DIAGNOSIS — G20 Parkinson's disease: Secondary | ICD-10-CM | POA: Diagnosis not present

## 2017-11-12 DIAGNOSIS — R262 Difficulty in walking, not elsewhere classified: Secondary | ICD-10-CM | POA: Diagnosis not present

## 2017-11-12 DIAGNOSIS — R41841 Cognitive communication deficit: Secondary | ICD-10-CM | POA: Diagnosis not present

## 2017-11-12 DIAGNOSIS — I10 Essential (primary) hypertension: Secondary | ICD-10-CM | POA: Diagnosis not present

## 2017-11-12 DIAGNOSIS — M5136 Other intervertebral disc degeneration, lumbar region: Secondary | ICD-10-CM | POA: Diagnosis not present

## 2017-11-12 DIAGNOSIS — F319 Bipolar disorder, unspecified: Secondary | ICD-10-CM | POA: Diagnosis not present

## 2017-11-12 DIAGNOSIS — G300 Alzheimer's disease with early onset: Secondary | ICD-10-CM | POA: Diagnosis not present

## 2017-11-13 DIAGNOSIS — G20 Parkinson's disease: Secondary | ICD-10-CM | POA: Diagnosis not present

## 2017-11-13 DIAGNOSIS — M5136 Other intervertebral disc degeneration, lumbar region: Secondary | ICD-10-CM | POA: Diagnosis not present

## 2017-11-13 DIAGNOSIS — G300 Alzheimer's disease with early onset: Secondary | ICD-10-CM | POA: Diagnosis not present

## 2017-11-13 DIAGNOSIS — R41841 Cognitive communication deficit: Secondary | ICD-10-CM | POA: Diagnosis not present

## 2017-11-13 DIAGNOSIS — I10 Essential (primary) hypertension: Secondary | ICD-10-CM | POA: Diagnosis not present

## 2017-11-13 DIAGNOSIS — R293 Abnormal posture: Secondary | ICD-10-CM | POA: Diagnosis not present

## 2017-11-13 DIAGNOSIS — F319 Bipolar disorder, unspecified: Secondary | ICD-10-CM | POA: Diagnosis not present

## 2017-11-13 DIAGNOSIS — R262 Difficulty in walking, not elsewhere classified: Secondary | ICD-10-CM | POA: Diagnosis not present

## 2017-11-14 DIAGNOSIS — M5136 Other intervertebral disc degeneration, lumbar region: Secondary | ICD-10-CM | POA: Diagnosis not present

## 2017-11-14 DIAGNOSIS — N390 Urinary tract infection, site not specified: Secondary | ICD-10-CM | POA: Diagnosis not present

## 2017-11-14 DIAGNOSIS — R41841 Cognitive communication deficit: Secondary | ICD-10-CM | POA: Diagnosis not present

## 2017-11-14 DIAGNOSIS — R293 Abnormal posture: Secondary | ICD-10-CM | POA: Diagnosis not present

## 2017-11-14 DIAGNOSIS — I10 Essential (primary) hypertension: Secondary | ICD-10-CM | POA: Diagnosis not present

## 2017-11-14 DIAGNOSIS — R262 Difficulty in walking, not elsewhere classified: Secondary | ICD-10-CM | POA: Diagnosis not present

## 2017-11-14 DIAGNOSIS — G300 Alzheimer's disease with early onset: Secondary | ICD-10-CM | POA: Diagnosis not present

## 2017-11-14 DIAGNOSIS — G20 Parkinson's disease: Secondary | ICD-10-CM | POA: Diagnosis not present

## 2017-11-14 DIAGNOSIS — E782 Mixed hyperlipidemia: Secondary | ICD-10-CM | POA: Diagnosis not present

## 2017-11-14 DIAGNOSIS — F319 Bipolar disorder, unspecified: Secondary | ICD-10-CM | POA: Diagnosis not present

## 2017-11-15 DIAGNOSIS — R293 Abnormal posture: Secondary | ICD-10-CM | POA: Diagnosis not present

## 2017-11-15 DIAGNOSIS — M5136 Other intervertebral disc degeneration, lumbar region: Secondary | ICD-10-CM | POA: Diagnosis not present

## 2017-11-15 DIAGNOSIS — R41841 Cognitive communication deficit: Secondary | ICD-10-CM | POA: Diagnosis not present

## 2017-11-15 DIAGNOSIS — R262 Difficulty in walking, not elsewhere classified: Secondary | ICD-10-CM | POA: Diagnosis not present

## 2017-11-15 DIAGNOSIS — G20 Parkinson's disease: Secondary | ICD-10-CM | POA: Diagnosis not present

## 2017-11-15 DIAGNOSIS — F319 Bipolar disorder, unspecified: Secondary | ICD-10-CM | POA: Diagnosis not present

## 2017-11-15 DIAGNOSIS — I10 Essential (primary) hypertension: Secondary | ICD-10-CM | POA: Diagnosis not present

## 2017-11-15 DIAGNOSIS — G300 Alzheimer's disease with early onset: Secondary | ICD-10-CM | POA: Diagnosis not present

## 2017-11-16 DIAGNOSIS — R262 Difficulty in walking, not elsewhere classified: Secondary | ICD-10-CM | POA: Diagnosis not present

## 2017-11-16 DIAGNOSIS — E785 Hyperlipidemia, unspecified: Secondary | ICD-10-CM | POA: Diagnosis not present

## 2017-11-16 DIAGNOSIS — M5136 Other intervertebral disc degeneration, lumbar region: Secondary | ICD-10-CM | POA: Diagnosis not present

## 2017-11-16 DIAGNOSIS — F319 Bipolar disorder, unspecified: Secondary | ICD-10-CM | POA: Diagnosis not present

## 2017-11-16 DIAGNOSIS — Z79899 Other long term (current) drug therapy: Secondary | ICD-10-CM | POA: Diagnosis not present

## 2017-11-16 DIAGNOSIS — E119 Type 2 diabetes mellitus without complications: Secondary | ICD-10-CM | POA: Diagnosis not present

## 2017-11-16 DIAGNOSIS — G20 Parkinson's disease: Secondary | ICD-10-CM | POA: Diagnosis not present

## 2017-11-16 DIAGNOSIS — R41841 Cognitive communication deficit: Secondary | ICD-10-CM | POA: Diagnosis not present

## 2017-11-16 DIAGNOSIS — D649 Anemia, unspecified: Secondary | ICD-10-CM | POA: Diagnosis not present

## 2017-11-16 DIAGNOSIS — I10 Essential (primary) hypertension: Secondary | ICD-10-CM | POA: Diagnosis not present

## 2017-11-16 DIAGNOSIS — G300 Alzheimer's disease with early onset: Secondary | ICD-10-CM | POA: Diagnosis not present

## 2017-11-16 DIAGNOSIS — R293 Abnormal posture: Secondary | ICD-10-CM | POA: Diagnosis not present

## 2017-11-20 DIAGNOSIS — R293 Abnormal posture: Secondary | ICD-10-CM | POA: Diagnosis not present

## 2017-11-20 DIAGNOSIS — I10 Essential (primary) hypertension: Secondary | ICD-10-CM | POA: Diagnosis not present

## 2017-11-20 DIAGNOSIS — R41841 Cognitive communication deficit: Secondary | ICD-10-CM | POA: Diagnosis not present

## 2017-11-20 DIAGNOSIS — G20 Parkinson's disease: Secondary | ICD-10-CM | POA: Diagnosis not present

## 2017-11-20 DIAGNOSIS — M5136 Other intervertebral disc degeneration, lumbar region: Secondary | ICD-10-CM | POA: Diagnosis not present

## 2017-11-20 DIAGNOSIS — G300 Alzheimer's disease with early onset: Secondary | ICD-10-CM | POA: Diagnosis not present

## 2017-11-20 DIAGNOSIS — R262 Difficulty in walking, not elsewhere classified: Secondary | ICD-10-CM | POA: Diagnosis not present

## 2017-11-20 DIAGNOSIS — F319 Bipolar disorder, unspecified: Secondary | ICD-10-CM | POA: Diagnosis not present

## 2017-11-21 DIAGNOSIS — R262 Difficulty in walking, not elsewhere classified: Secondary | ICD-10-CM | POA: Diagnosis not present

## 2017-11-21 DIAGNOSIS — R41841 Cognitive communication deficit: Secondary | ICD-10-CM | POA: Diagnosis not present

## 2017-11-21 DIAGNOSIS — F319 Bipolar disorder, unspecified: Secondary | ICD-10-CM | POA: Diagnosis not present

## 2017-11-21 DIAGNOSIS — R293 Abnormal posture: Secondary | ICD-10-CM | POA: Diagnosis not present

## 2017-11-21 DIAGNOSIS — I10 Essential (primary) hypertension: Secondary | ICD-10-CM | POA: Diagnosis not present

## 2017-11-21 DIAGNOSIS — G300 Alzheimer's disease with early onset: Secondary | ICD-10-CM | POA: Diagnosis not present

## 2017-11-21 DIAGNOSIS — M5136 Other intervertebral disc degeneration, lumbar region: Secondary | ICD-10-CM | POA: Diagnosis not present

## 2017-11-21 DIAGNOSIS — G20 Parkinson's disease: Secondary | ICD-10-CM | POA: Diagnosis not present

## 2017-11-22 DIAGNOSIS — M5136 Other intervertebral disc degeneration, lumbar region: Secondary | ICD-10-CM | POA: Diagnosis not present

## 2017-11-22 DIAGNOSIS — R293 Abnormal posture: Secondary | ICD-10-CM | POA: Diagnosis not present

## 2017-11-22 DIAGNOSIS — G20 Parkinson's disease: Secondary | ICD-10-CM | POA: Diagnosis not present

## 2017-11-22 DIAGNOSIS — F319 Bipolar disorder, unspecified: Secondary | ICD-10-CM | POA: Diagnosis not present

## 2017-11-22 DIAGNOSIS — I10 Essential (primary) hypertension: Secondary | ICD-10-CM | POA: Diagnosis not present

## 2017-11-22 DIAGNOSIS — R262 Difficulty in walking, not elsewhere classified: Secondary | ICD-10-CM | POA: Diagnosis not present

## 2017-11-22 DIAGNOSIS — G300 Alzheimer's disease with early onset: Secondary | ICD-10-CM | POA: Diagnosis not present

## 2017-11-22 DIAGNOSIS — R41841 Cognitive communication deficit: Secondary | ICD-10-CM | POA: Diagnosis not present

## 2017-11-23 DIAGNOSIS — R41841 Cognitive communication deficit: Secondary | ICD-10-CM | POA: Diagnosis not present

## 2017-11-23 DIAGNOSIS — R293 Abnormal posture: Secondary | ICD-10-CM | POA: Diagnosis not present

## 2017-11-23 DIAGNOSIS — M5136 Other intervertebral disc degeneration, lumbar region: Secondary | ICD-10-CM | POA: Diagnosis not present

## 2017-11-23 DIAGNOSIS — G20 Parkinson's disease: Secondary | ICD-10-CM | POA: Diagnosis not present

## 2017-11-23 DIAGNOSIS — F319 Bipolar disorder, unspecified: Secondary | ICD-10-CM | POA: Diagnosis not present

## 2017-11-23 DIAGNOSIS — I10 Essential (primary) hypertension: Secondary | ICD-10-CM | POA: Diagnosis not present

## 2017-11-23 DIAGNOSIS — R262 Difficulty in walking, not elsewhere classified: Secondary | ICD-10-CM | POA: Diagnosis not present

## 2017-11-23 DIAGNOSIS — G300 Alzheimer's disease with early onset: Secondary | ICD-10-CM | POA: Diagnosis not present

## 2017-11-24 DIAGNOSIS — R293 Abnormal posture: Secondary | ICD-10-CM | POA: Diagnosis not present

## 2017-11-24 DIAGNOSIS — G20 Parkinson's disease: Secondary | ICD-10-CM | POA: Diagnosis not present

## 2017-11-24 DIAGNOSIS — G300 Alzheimer's disease with early onset: Secondary | ICD-10-CM | POA: Diagnosis not present

## 2017-11-24 DIAGNOSIS — R41841 Cognitive communication deficit: Secondary | ICD-10-CM | POA: Diagnosis not present

## 2017-11-24 DIAGNOSIS — R262 Difficulty in walking, not elsewhere classified: Secondary | ICD-10-CM | POA: Diagnosis not present

## 2017-11-24 DIAGNOSIS — F319 Bipolar disorder, unspecified: Secondary | ICD-10-CM | POA: Diagnosis not present

## 2017-11-24 DIAGNOSIS — I10 Essential (primary) hypertension: Secondary | ICD-10-CM | POA: Diagnosis not present

## 2017-11-24 DIAGNOSIS — M5136 Other intervertebral disc degeneration, lumbar region: Secondary | ICD-10-CM | POA: Diagnosis not present

## 2017-11-26 DIAGNOSIS — R262 Difficulty in walking, not elsewhere classified: Secondary | ICD-10-CM | POA: Diagnosis not present

## 2017-11-26 DIAGNOSIS — I10 Essential (primary) hypertension: Secondary | ICD-10-CM | POA: Diagnosis not present

## 2017-11-26 DIAGNOSIS — F319 Bipolar disorder, unspecified: Secondary | ICD-10-CM | POA: Diagnosis not present

## 2017-11-26 DIAGNOSIS — G20 Parkinson's disease: Secondary | ICD-10-CM | POA: Diagnosis not present

## 2017-11-26 DIAGNOSIS — R41841 Cognitive communication deficit: Secondary | ICD-10-CM | POA: Diagnosis not present

## 2017-11-26 DIAGNOSIS — R293 Abnormal posture: Secondary | ICD-10-CM | POA: Diagnosis not present

## 2017-11-26 DIAGNOSIS — G300 Alzheimer's disease with early onset: Secondary | ICD-10-CM | POA: Diagnosis not present

## 2017-11-26 DIAGNOSIS — M5136 Other intervertebral disc degeneration, lumbar region: Secondary | ICD-10-CM | POA: Diagnosis not present

## 2017-11-27 DIAGNOSIS — R262 Difficulty in walking, not elsewhere classified: Secondary | ICD-10-CM | POA: Diagnosis not present

## 2017-11-27 DIAGNOSIS — R41841 Cognitive communication deficit: Secondary | ICD-10-CM | POA: Diagnosis not present

## 2017-11-27 DIAGNOSIS — M5136 Other intervertebral disc degeneration, lumbar region: Secondary | ICD-10-CM | POA: Diagnosis not present

## 2017-11-27 DIAGNOSIS — G20 Parkinson's disease: Secondary | ICD-10-CM | POA: Diagnosis not present

## 2017-11-27 DIAGNOSIS — R293 Abnormal posture: Secondary | ICD-10-CM | POA: Diagnosis not present

## 2017-11-27 DIAGNOSIS — G300 Alzheimer's disease with early onset: Secondary | ICD-10-CM | POA: Diagnosis not present

## 2017-11-27 DIAGNOSIS — F3175 Bipolar disorder, in partial remission, most recent episode depressed: Secondary | ICD-10-CM | POA: Diagnosis not present

## 2017-11-27 DIAGNOSIS — F411 Generalized anxiety disorder: Secondary | ICD-10-CM | POA: Diagnosis not present

## 2017-11-27 DIAGNOSIS — F319 Bipolar disorder, unspecified: Secondary | ICD-10-CM | POA: Diagnosis not present

## 2017-11-27 DIAGNOSIS — I10 Essential (primary) hypertension: Secondary | ICD-10-CM | POA: Diagnosis not present

## 2017-11-28 DIAGNOSIS — I10 Essential (primary) hypertension: Secondary | ICD-10-CM | POA: Diagnosis not present

## 2017-11-28 DIAGNOSIS — G300 Alzheimer's disease with early onset: Secondary | ICD-10-CM | POA: Diagnosis not present

## 2017-11-28 DIAGNOSIS — G20 Parkinson's disease: Secondary | ICD-10-CM | POA: Diagnosis not present

## 2017-11-28 DIAGNOSIS — F319 Bipolar disorder, unspecified: Secondary | ICD-10-CM | POA: Diagnosis not present

## 2017-11-28 DIAGNOSIS — R293 Abnormal posture: Secondary | ICD-10-CM | POA: Diagnosis not present

## 2017-11-28 DIAGNOSIS — M79604 Pain in right leg: Secondary | ICD-10-CM | POA: Diagnosis not present

## 2017-11-28 DIAGNOSIS — M5136 Other intervertebral disc degeneration, lumbar region: Secondary | ICD-10-CM | POA: Diagnosis not present

## 2017-11-28 DIAGNOSIS — R41841 Cognitive communication deficit: Secondary | ICD-10-CM | POA: Diagnosis not present

## 2017-11-28 DIAGNOSIS — R262 Difficulty in walking, not elsewhere classified: Secondary | ICD-10-CM | POA: Diagnosis not present

## 2017-11-29 DIAGNOSIS — F319 Bipolar disorder, unspecified: Secondary | ICD-10-CM | POA: Diagnosis not present

## 2017-11-29 DIAGNOSIS — R41841 Cognitive communication deficit: Secondary | ICD-10-CM | POA: Diagnosis not present

## 2017-11-29 DIAGNOSIS — I10 Essential (primary) hypertension: Secondary | ICD-10-CM | POA: Diagnosis not present

## 2017-11-29 DIAGNOSIS — M5136 Other intervertebral disc degeneration, lumbar region: Secondary | ICD-10-CM | POA: Diagnosis not present

## 2017-11-29 DIAGNOSIS — G20 Parkinson's disease: Secondary | ICD-10-CM | POA: Diagnosis not present

## 2017-11-29 DIAGNOSIS — R262 Difficulty in walking, not elsewhere classified: Secondary | ICD-10-CM | POA: Diagnosis not present

## 2017-11-29 DIAGNOSIS — F419 Anxiety disorder, unspecified: Secondary | ICD-10-CM | POA: Diagnosis not present

## 2017-11-29 DIAGNOSIS — G300 Alzheimer's disease with early onset: Secondary | ICD-10-CM | POA: Diagnosis not present

## 2017-11-29 DIAGNOSIS — R293 Abnormal posture: Secondary | ICD-10-CM | POA: Diagnosis not present

## 2017-11-29 DIAGNOSIS — F0281 Dementia in other diseases classified elsewhere with behavioral disturbance: Secondary | ICD-10-CM | POA: Diagnosis not present

## 2017-11-30 DIAGNOSIS — R293 Abnormal posture: Secondary | ICD-10-CM | POA: Diagnosis not present

## 2017-11-30 DIAGNOSIS — R41841 Cognitive communication deficit: Secondary | ICD-10-CM | POA: Diagnosis not present

## 2017-11-30 DIAGNOSIS — G300 Alzheimer's disease with early onset: Secondary | ICD-10-CM | POA: Diagnosis not present

## 2017-11-30 DIAGNOSIS — I10 Essential (primary) hypertension: Secondary | ICD-10-CM | POA: Diagnosis not present

## 2017-11-30 DIAGNOSIS — G20 Parkinson's disease: Secondary | ICD-10-CM | POA: Diagnosis not present

## 2017-11-30 DIAGNOSIS — F319 Bipolar disorder, unspecified: Secondary | ICD-10-CM | POA: Diagnosis not present

## 2017-11-30 DIAGNOSIS — R262 Difficulty in walking, not elsewhere classified: Secondary | ICD-10-CM | POA: Diagnosis not present

## 2017-11-30 DIAGNOSIS — M5136 Other intervertebral disc degeneration, lumbar region: Secondary | ICD-10-CM | POA: Diagnosis not present

## 2017-12-03 DIAGNOSIS — M5136 Other intervertebral disc degeneration, lumbar region: Secondary | ICD-10-CM | POA: Diagnosis not present

## 2017-12-03 DIAGNOSIS — R41841 Cognitive communication deficit: Secondary | ICD-10-CM | POA: Diagnosis not present

## 2017-12-03 DIAGNOSIS — G20 Parkinson's disease: Secondary | ICD-10-CM | POA: Diagnosis not present

## 2017-12-03 DIAGNOSIS — F319 Bipolar disorder, unspecified: Secondary | ICD-10-CM | POA: Diagnosis not present

## 2017-12-03 DIAGNOSIS — G300 Alzheimer's disease with early onset: Secondary | ICD-10-CM | POA: Diagnosis not present

## 2017-12-03 DIAGNOSIS — R262 Difficulty in walking, not elsewhere classified: Secondary | ICD-10-CM | POA: Diagnosis not present

## 2017-12-03 DIAGNOSIS — R293 Abnormal posture: Secondary | ICD-10-CM | POA: Diagnosis not present

## 2017-12-03 DIAGNOSIS — I10 Essential (primary) hypertension: Secondary | ICD-10-CM | POA: Diagnosis not present

## 2017-12-04 DIAGNOSIS — G20 Parkinson's disease: Secondary | ICD-10-CM | POA: Diagnosis not present

## 2017-12-04 DIAGNOSIS — F3175 Bipolar disorder, in partial remission, most recent episode depressed: Secondary | ICD-10-CM | POA: Diagnosis not present

## 2017-12-04 DIAGNOSIS — G300 Alzheimer's disease with early onset: Secondary | ICD-10-CM | POA: Diagnosis not present

## 2017-12-04 DIAGNOSIS — I10 Essential (primary) hypertension: Secondary | ICD-10-CM | POA: Diagnosis not present

## 2017-12-04 DIAGNOSIS — M5136 Other intervertebral disc degeneration, lumbar region: Secondary | ICD-10-CM | POA: Diagnosis not present

## 2017-12-04 DIAGNOSIS — R293 Abnormal posture: Secondary | ICD-10-CM | POA: Diagnosis not present

## 2017-12-04 DIAGNOSIS — R262 Difficulty in walking, not elsewhere classified: Secondary | ICD-10-CM | POA: Diagnosis not present

## 2017-12-04 DIAGNOSIS — F411 Generalized anxiety disorder: Secondary | ICD-10-CM | POA: Diagnosis not present

## 2017-12-04 DIAGNOSIS — F319 Bipolar disorder, unspecified: Secondary | ICD-10-CM | POA: Diagnosis not present

## 2017-12-04 DIAGNOSIS — R41841 Cognitive communication deficit: Secondary | ICD-10-CM | POA: Diagnosis not present

## 2017-12-05 DIAGNOSIS — R293 Abnormal posture: Secondary | ICD-10-CM | POA: Diagnosis not present

## 2017-12-05 DIAGNOSIS — G300 Alzheimer's disease with early onset: Secondary | ICD-10-CM | POA: Diagnosis not present

## 2017-12-05 DIAGNOSIS — M5136 Other intervertebral disc degeneration, lumbar region: Secondary | ICD-10-CM | POA: Diagnosis not present

## 2017-12-05 DIAGNOSIS — G20 Parkinson's disease: Secondary | ICD-10-CM | POA: Diagnosis not present

## 2017-12-05 DIAGNOSIS — I10 Essential (primary) hypertension: Secondary | ICD-10-CM | POA: Diagnosis not present

## 2017-12-05 DIAGNOSIS — R262 Difficulty in walking, not elsewhere classified: Secondary | ICD-10-CM | POA: Diagnosis not present

## 2017-12-05 DIAGNOSIS — F319 Bipolar disorder, unspecified: Secondary | ICD-10-CM | POA: Diagnosis not present

## 2017-12-05 DIAGNOSIS — R41841 Cognitive communication deficit: Secondary | ICD-10-CM | POA: Diagnosis not present

## 2017-12-06 DIAGNOSIS — I10 Essential (primary) hypertension: Secondary | ICD-10-CM | POA: Diagnosis not present

## 2017-12-06 DIAGNOSIS — F319 Bipolar disorder, unspecified: Secondary | ICD-10-CM | POA: Diagnosis not present

## 2017-12-06 DIAGNOSIS — G300 Alzheimer's disease with early onset: Secondary | ICD-10-CM | POA: Diagnosis not present

## 2017-12-06 DIAGNOSIS — R41841 Cognitive communication deficit: Secondary | ICD-10-CM | POA: Diagnosis not present

## 2017-12-06 DIAGNOSIS — G20 Parkinson's disease: Secondary | ICD-10-CM | POA: Diagnosis not present

## 2017-12-06 DIAGNOSIS — M5136 Other intervertebral disc degeneration, lumbar region: Secondary | ICD-10-CM | POA: Diagnosis not present

## 2017-12-06 DIAGNOSIS — R293 Abnormal posture: Secondary | ICD-10-CM | POA: Diagnosis not present

## 2017-12-06 DIAGNOSIS — R262 Difficulty in walking, not elsewhere classified: Secondary | ICD-10-CM | POA: Diagnosis not present

## 2017-12-07 DIAGNOSIS — F319 Bipolar disorder, unspecified: Secondary | ICD-10-CM | POA: Diagnosis not present

## 2017-12-07 DIAGNOSIS — I10 Essential (primary) hypertension: Secondary | ICD-10-CM | POA: Diagnosis not present

## 2017-12-07 DIAGNOSIS — R41841 Cognitive communication deficit: Secondary | ICD-10-CM | POA: Diagnosis not present

## 2017-12-07 DIAGNOSIS — R293 Abnormal posture: Secondary | ICD-10-CM | POA: Diagnosis not present

## 2017-12-07 DIAGNOSIS — G300 Alzheimer's disease with early onset: Secondary | ICD-10-CM | POA: Diagnosis not present

## 2017-12-07 DIAGNOSIS — G20 Parkinson's disease: Secondary | ICD-10-CM | POA: Diagnosis not present

## 2017-12-07 DIAGNOSIS — R262 Difficulty in walking, not elsewhere classified: Secondary | ICD-10-CM | POA: Diagnosis not present

## 2017-12-07 DIAGNOSIS — M5136 Other intervertebral disc degeneration, lumbar region: Secondary | ICD-10-CM | POA: Diagnosis not present

## 2017-12-10 DIAGNOSIS — M5136 Other intervertebral disc degeneration, lumbar region: Secondary | ICD-10-CM | POA: Diagnosis not present

## 2017-12-10 DIAGNOSIS — E039 Hypothyroidism, unspecified: Secondary | ICD-10-CM | POA: Diagnosis not present

## 2017-12-10 DIAGNOSIS — R262 Difficulty in walking, not elsewhere classified: Secondary | ICD-10-CM | POA: Diagnosis not present

## 2017-12-10 DIAGNOSIS — I10 Essential (primary) hypertension: Secondary | ICD-10-CM | POA: Diagnosis not present

## 2017-12-10 DIAGNOSIS — G20 Parkinson's disease: Secondary | ICD-10-CM | POA: Diagnosis not present

## 2017-12-10 DIAGNOSIS — R293 Abnormal posture: Secondary | ICD-10-CM | POA: Diagnosis not present

## 2017-12-10 DIAGNOSIS — F319 Bipolar disorder, unspecified: Secondary | ICD-10-CM | POA: Diagnosis not present

## 2017-12-10 DIAGNOSIS — E782 Mixed hyperlipidemia: Secondary | ICD-10-CM | POA: Diagnosis not present

## 2017-12-10 DIAGNOSIS — R41841 Cognitive communication deficit: Secondary | ICD-10-CM | POA: Diagnosis not present

## 2017-12-10 DIAGNOSIS — N390 Urinary tract infection, site not specified: Secondary | ICD-10-CM | POA: Diagnosis not present

## 2017-12-10 DIAGNOSIS — G300 Alzheimer's disease with early onset: Secondary | ICD-10-CM | POA: Diagnosis not present

## 2017-12-11 DIAGNOSIS — I10 Essential (primary) hypertension: Secondary | ICD-10-CM | POA: Diagnosis not present

## 2017-12-11 DIAGNOSIS — M5136 Other intervertebral disc degeneration, lumbar region: Secondary | ICD-10-CM | POA: Diagnosis not present

## 2017-12-11 DIAGNOSIS — G20 Parkinson's disease: Secondary | ICD-10-CM | POA: Diagnosis not present

## 2017-12-11 DIAGNOSIS — R2241 Localized swelling, mass and lump, right lower limb: Secondary | ICD-10-CM | POA: Diagnosis not present

## 2017-12-11 DIAGNOSIS — R262 Difficulty in walking, not elsewhere classified: Secondary | ICD-10-CM | POA: Diagnosis not present

## 2017-12-11 DIAGNOSIS — G300 Alzheimer's disease with early onset: Secondary | ICD-10-CM | POA: Diagnosis not present

## 2017-12-11 DIAGNOSIS — R293 Abnormal posture: Secondary | ICD-10-CM | POA: Diagnosis not present

## 2017-12-11 DIAGNOSIS — F319 Bipolar disorder, unspecified: Secondary | ICD-10-CM | POA: Diagnosis not present

## 2017-12-11 DIAGNOSIS — F411 Generalized anxiety disorder: Secondary | ICD-10-CM | POA: Diagnosis not present

## 2017-12-11 DIAGNOSIS — R41841 Cognitive communication deficit: Secondary | ICD-10-CM | POA: Diagnosis not present

## 2017-12-12 DIAGNOSIS — G300 Alzheimer's disease with early onset: Secondary | ICD-10-CM | POA: Diagnosis not present

## 2017-12-12 DIAGNOSIS — R293 Abnormal posture: Secondary | ICD-10-CM | POA: Diagnosis not present

## 2017-12-12 DIAGNOSIS — R69 Illness, unspecified: Secondary | ICD-10-CM | POA: Diagnosis not present

## 2017-12-12 DIAGNOSIS — M5136 Other intervertebral disc degeneration, lumbar region: Secondary | ICD-10-CM | POA: Diagnosis not present

## 2017-12-12 DIAGNOSIS — I10 Essential (primary) hypertension: Secondary | ICD-10-CM | POA: Diagnosis not present

## 2017-12-12 DIAGNOSIS — R41841 Cognitive communication deficit: Secondary | ICD-10-CM | POA: Diagnosis not present

## 2017-12-12 DIAGNOSIS — G20 Parkinson's disease: Secondary | ICD-10-CM | POA: Diagnosis not present

## 2017-12-12 DIAGNOSIS — R262 Difficulty in walking, not elsewhere classified: Secondary | ICD-10-CM | POA: Diagnosis not present

## 2017-12-12 DIAGNOSIS — F319 Bipolar disorder, unspecified: Secondary | ICD-10-CM | POA: Diagnosis not present

## 2017-12-13 DIAGNOSIS — R262 Difficulty in walking, not elsewhere classified: Secondary | ICD-10-CM | POA: Diagnosis not present

## 2017-12-13 DIAGNOSIS — M5136 Other intervertebral disc degeneration, lumbar region: Secondary | ICD-10-CM | POA: Diagnosis not present

## 2017-12-13 DIAGNOSIS — R293 Abnormal posture: Secondary | ICD-10-CM | POA: Diagnosis not present

## 2017-12-13 DIAGNOSIS — G20 Parkinson's disease: Secondary | ICD-10-CM | POA: Diagnosis not present

## 2017-12-13 DIAGNOSIS — R41841 Cognitive communication deficit: Secondary | ICD-10-CM | POA: Diagnosis not present

## 2017-12-13 DIAGNOSIS — F319 Bipolar disorder, unspecified: Secondary | ICD-10-CM | POA: Diagnosis not present

## 2017-12-13 DIAGNOSIS — I10 Essential (primary) hypertension: Secondary | ICD-10-CM | POA: Diagnosis not present

## 2017-12-13 DIAGNOSIS — G300 Alzheimer's disease with early onset: Secondary | ICD-10-CM | POA: Diagnosis not present

## 2017-12-14 DIAGNOSIS — R41841 Cognitive communication deficit: Secondary | ICD-10-CM | POA: Diagnosis not present

## 2017-12-14 DIAGNOSIS — R293 Abnormal posture: Secondary | ICD-10-CM | POA: Diagnosis not present

## 2017-12-14 DIAGNOSIS — I10 Essential (primary) hypertension: Secondary | ICD-10-CM | POA: Diagnosis not present

## 2017-12-14 DIAGNOSIS — F319 Bipolar disorder, unspecified: Secondary | ICD-10-CM | POA: Diagnosis not present

## 2017-12-14 DIAGNOSIS — M5136 Other intervertebral disc degeneration, lumbar region: Secondary | ICD-10-CM | POA: Diagnosis not present

## 2017-12-14 DIAGNOSIS — R262 Difficulty in walking, not elsewhere classified: Secondary | ICD-10-CM | POA: Diagnosis not present

## 2017-12-14 DIAGNOSIS — G20 Parkinson's disease: Secondary | ICD-10-CM | POA: Diagnosis not present

## 2017-12-14 DIAGNOSIS — G300 Alzheimer's disease with early onset: Secondary | ICD-10-CM | POA: Diagnosis not present

## 2017-12-15 DIAGNOSIS — D649 Anemia, unspecified: Secondary | ICD-10-CM | POA: Diagnosis not present

## 2017-12-15 DIAGNOSIS — L03115 Cellulitis of right lower limb: Secondary | ICD-10-CM | POA: Diagnosis not present

## 2017-12-17 DIAGNOSIS — R262 Difficulty in walking, not elsewhere classified: Secondary | ICD-10-CM | POA: Diagnosis not present

## 2017-12-17 DIAGNOSIS — F319 Bipolar disorder, unspecified: Secondary | ICD-10-CM | POA: Diagnosis not present

## 2017-12-17 DIAGNOSIS — M5136 Other intervertebral disc degeneration, lumbar region: Secondary | ICD-10-CM | POA: Diagnosis not present

## 2017-12-17 DIAGNOSIS — G20 Parkinson's disease: Secondary | ICD-10-CM | POA: Diagnosis not present

## 2017-12-17 DIAGNOSIS — R293 Abnormal posture: Secondary | ICD-10-CM | POA: Diagnosis not present

## 2017-12-17 DIAGNOSIS — R41841 Cognitive communication deficit: Secondary | ICD-10-CM | POA: Diagnosis not present

## 2017-12-17 DIAGNOSIS — G300 Alzheimer's disease with early onset: Secondary | ICD-10-CM | POA: Diagnosis not present

## 2017-12-17 DIAGNOSIS — I10 Essential (primary) hypertension: Secondary | ICD-10-CM | POA: Diagnosis not present

## 2017-12-18 DIAGNOSIS — R41841 Cognitive communication deficit: Secondary | ICD-10-CM | POA: Diagnosis not present

## 2017-12-18 DIAGNOSIS — G300 Alzheimer's disease with early onset: Secondary | ICD-10-CM | POA: Diagnosis not present

## 2017-12-18 DIAGNOSIS — M5136 Other intervertebral disc degeneration, lumbar region: Secondary | ICD-10-CM | POA: Diagnosis not present

## 2017-12-18 DIAGNOSIS — N186 End stage renal disease: Secondary | ICD-10-CM | POA: Diagnosis not present

## 2017-12-18 DIAGNOSIS — M6281 Muscle weakness (generalized): Secondary | ICD-10-CM | POA: Diagnosis not present

## 2017-12-18 DIAGNOSIS — F319 Bipolar disorder, unspecified: Secondary | ICD-10-CM | POA: Diagnosis not present

## 2017-12-18 DIAGNOSIS — N185 Chronic kidney disease, stage 5: Secondary | ICD-10-CM | POA: Diagnosis not present

## 2017-12-18 DIAGNOSIS — G20 Parkinson's disease: Secondary | ICD-10-CM | POA: Diagnosis not present

## 2017-12-18 DIAGNOSIS — E440 Moderate protein-calorie malnutrition: Secondary | ICD-10-CM | POA: Diagnosis not present

## 2017-12-19 DIAGNOSIS — M5136 Other intervertebral disc degeneration, lumbar region: Secondary | ICD-10-CM | POA: Diagnosis not present

## 2017-12-19 DIAGNOSIS — R41841 Cognitive communication deficit: Secondary | ICD-10-CM | POA: Diagnosis not present

## 2017-12-19 DIAGNOSIS — N186 End stage renal disease: Secondary | ICD-10-CM | POA: Diagnosis not present

## 2017-12-19 DIAGNOSIS — F319 Bipolar disorder, unspecified: Secondary | ICD-10-CM | POA: Diagnosis not present

## 2017-12-19 DIAGNOSIS — G20 Parkinson's disease: Secondary | ICD-10-CM | POA: Diagnosis not present

## 2017-12-19 DIAGNOSIS — M6281 Muscle weakness (generalized): Secondary | ICD-10-CM | POA: Diagnosis not present

## 2017-12-19 DIAGNOSIS — G300 Alzheimer's disease with early onset: Secondary | ICD-10-CM | POA: Diagnosis not present

## 2017-12-19 DIAGNOSIS — N185 Chronic kidney disease, stage 5: Secondary | ICD-10-CM | POA: Diagnosis not present

## 2017-12-19 DIAGNOSIS — E440 Moderate protein-calorie malnutrition: Secondary | ICD-10-CM | POA: Diagnosis not present

## 2017-12-20 DIAGNOSIS — F319 Bipolar disorder, unspecified: Secondary | ICD-10-CM | POA: Diagnosis not present

## 2017-12-20 DIAGNOSIS — N186 End stage renal disease: Secondary | ICD-10-CM | POA: Diagnosis not present

## 2017-12-20 DIAGNOSIS — G300 Alzheimer's disease with early onset: Secondary | ICD-10-CM | POA: Diagnosis not present

## 2017-12-20 DIAGNOSIS — M5136 Other intervertebral disc degeneration, lumbar region: Secondary | ICD-10-CM | POA: Diagnosis not present

## 2017-12-20 DIAGNOSIS — N185 Chronic kidney disease, stage 5: Secondary | ICD-10-CM | POA: Diagnosis not present

## 2017-12-20 DIAGNOSIS — E440 Moderate protein-calorie malnutrition: Secondary | ICD-10-CM | POA: Diagnosis not present

## 2017-12-20 DIAGNOSIS — M6281 Muscle weakness (generalized): Secondary | ICD-10-CM | POA: Diagnosis not present

## 2017-12-20 DIAGNOSIS — R41841 Cognitive communication deficit: Secondary | ICD-10-CM | POA: Diagnosis not present

## 2017-12-20 DIAGNOSIS — G20 Parkinson's disease: Secondary | ICD-10-CM | POA: Diagnosis not present

## 2017-12-23 DIAGNOSIS — F411 Generalized anxiety disorder: Secondary | ICD-10-CM | POA: Diagnosis not present

## 2017-12-24 DIAGNOSIS — R41841 Cognitive communication deficit: Secondary | ICD-10-CM | POA: Diagnosis not present

## 2017-12-24 DIAGNOSIS — M6281 Muscle weakness (generalized): Secondary | ICD-10-CM | POA: Diagnosis not present

## 2017-12-24 DIAGNOSIS — M5136 Other intervertebral disc degeneration, lumbar region: Secondary | ICD-10-CM | POA: Diagnosis not present

## 2017-12-24 DIAGNOSIS — E440 Moderate protein-calorie malnutrition: Secondary | ICD-10-CM | POA: Diagnosis not present

## 2017-12-24 DIAGNOSIS — G300 Alzheimer's disease with early onset: Secondary | ICD-10-CM | POA: Diagnosis not present

## 2017-12-24 DIAGNOSIS — F319 Bipolar disorder, unspecified: Secondary | ICD-10-CM | POA: Diagnosis not present

## 2017-12-24 DIAGNOSIS — N185 Chronic kidney disease, stage 5: Secondary | ICD-10-CM | POA: Diagnosis not present

## 2017-12-24 DIAGNOSIS — G20 Parkinson's disease: Secondary | ICD-10-CM | POA: Diagnosis not present

## 2017-12-24 DIAGNOSIS — N186 End stage renal disease: Secondary | ICD-10-CM | POA: Diagnosis not present

## 2017-12-25 DIAGNOSIS — N185 Chronic kidney disease, stage 5: Secondary | ICD-10-CM | POA: Diagnosis not present

## 2017-12-25 DIAGNOSIS — E440 Moderate protein-calorie malnutrition: Secondary | ICD-10-CM | POA: Diagnosis not present

## 2017-12-25 DIAGNOSIS — F419 Anxiety disorder, unspecified: Secondary | ICD-10-CM | POA: Diagnosis not present

## 2017-12-25 DIAGNOSIS — F319 Bipolar disorder, unspecified: Secondary | ICD-10-CM | POA: Diagnosis not present

## 2017-12-25 DIAGNOSIS — G300 Alzheimer's disease with early onset: Secondary | ICD-10-CM | POA: Diagnosis not present

## 2017-12-25 DIAGNOSIS — N3281 Overactive bladder: Secondary | ICD-10-CM | POA: Diagnosis not present

## 2017-12-25 DIAGNOSIS — I10 Essential (primary) hypertension: Secondary | ICD-10-CM | POA: Diagnosis not present

## 2017-12-25 DIAGNOSIS — R41841 Cognitive communication deficit: Secondary | ICD-10-CM | POA: Diagnosis not present

## 2017-12-25 DIAGNOSIS — M5136 Other intervertebral disc degeneration, lumbar region: Secondary | ICD-10-CM | POA: Diagnosis not present

## 2017-12-25 DIAGNOSIS — N186 End stage renal disease: Secondary | ICD-10-CM | POA: Diagnosis not present

## 2017-12-25 DIAGNOSIS — G20 Parkinson's disease: Secondary | ICD-10-CM | POA: Diagnosis not present

## 2017-12-25 DIAGNOSIS — M6281 Muscle weakness (generalized): Secondary | ICD-10-CM | POA: Diagnosis not present

## 2017-12-26 DIAGNOSIS — M6281 Muscle weakness (generalized): Secondary | ICD-10-CM | POA: Diagnosis not present

## 2017-12-26 DIAGNOSIS — G20 Parkinson's disease: Secondary | ICD-10-CM | POA: Diagnosis not present

## 2017-12-26 DIAGNOSIS — R41841 Cognitive communication deficit: Secondary | ICD-10-CM | POA: Diagnosis not present

## 2017-12-26 DIAGNOSIS — M5136 Other intervertebral disc degeneration, lumbar region: Secondary | ICD-10-CM | POA: Diagnosis not present

## 2017-12-26 DIAGNOSIS — E440 Moderate protein-calorie malnutrition: Secondary | ICD-10-CM | POA: Diagnosis not present

## 2017-12-26 DIAGNOSIS — F319 Bipolar disorder, unspecified: Secondary | ICD-10-CM | POA: Diagnosis not present

## 2017-12-26 DIAGNOSIS — G300 Alzheimer's disease with early onset: Secondary | ICD-10-CM | POA: Diagnosis not present

## 2017-12-26 DIAGNOSIS — N186 End stage renal disease: Secondary | ICD-10-CM | POA: Diagnosis not present

## 2017-12-26 DIAGNOSIS — N185 Chronic kidney disease, stage 5: Secondary | ICD-10-CM | POA: Diagnosis not present

## 2017-12-27 DIAGNOSIS — N185 Chronic kidney disease, stage 5: Secondary | ICD-10-CM | POA: Diagnosis not present

## 2017-12-27 DIAGNOSIS — R41841 Cognitive communication deficit: Secondary | ICD-10-CM | POA: Diagnosis not present

## 2017-12-27 DIAGNOSIS — N186 End stage renal disease: Secondary | ICD-10-CM | POA: Diagnosis not present

## 2017-12-27 DIAGNOSIS — F319 Bipolar disorder, unspecified: Secondary | ICD-10-CM | POA: Diagnosis not present

## 2017-12-27 DIAGNOSIS — E440 Moderate protein-calorie malnutrition: Secondary | ICD-10-CM | POA: Diagnosis not present

## 2017-12-27 DIAGNOSIS — M6281 Muscle weakness (generalized): Secondary | ICD-10-CM | POA: Diagnosis not present

## 2017-12-27 DIAGNOSIS — G20 Parkinson's disease: Secondary | ICD-10-CM | POA: Diagnosis not present

## 2017-12-27 DIAGNOSIS — G300 Alzheimer's disease with early onset: Secondary | ICD-10-CM | POA: Diagnosis not present

## 2017-12-27 DIAGNOSIS — M5136 Other intervertebral disc degeneration, lumbar region: Secondary | ICD-10-CM | POA: Diagnosis not present

## 2017-12-28 DIAGNOSIS — R41841 Cognitive communication deficit: Secondary | ICD-10-CM | POA: Diagnosis not present

## 2017-12-28 DIAGNOSIS — E440 Moderate protein-calorie malnutrition: Secondary | ICD-10-CM | POA: Diagnosis not present

## 2017-12-28 DIAGNOSIS — G300 Alzheimer's disease with early onset: Secondary | ICD-10-CM | POA: Diagnosis not present

## 2017-12-28 DIAGNOSIS — G20 Parkinson's disease: Secondary | ICD-10-CM | POA: Diagnosis not present

## 2017-12-28 DIAGNOSIS — M6281 Muscle weakness (generalized): Secondary | ICD-10-CM | POA: Diagnosis not present

## 2017-12-28 DIAGNOSIS — N185 Chronic kidney disease, stage 5: Secondary | ICD-10-CM | POA: Diagnosis not present

## 2017-12-28 DIAGNOSIS — N186 End stage renal disease: Secondary | ICD-10-CM | POA: Diagnosis not present

## 2017-12-28 DIAGNOSIS — M5136 Other intervertebral disc degeneration, lumbar region: Secondary | ICD-10-CM | POA: Diagnosis not present

## 2017-12-28 DIAGNOSIS — F319 Bipolar disorder, unspecified: Secondary | ICD-10-CM | POA: Diagnosis not present

## 2017-12-31 DIAGNOSIS — N186 End stage renal disease: Secondary | ICD-10-CM | POA: Diagnosis not present

## 2017-12-31 DIAGNOSIS — N185 Chronic kidney disease, stage 5: Secondary | ICD-10-CM | POA: Diagnosis not present

## 2017-12-31 DIAGNOSIS — E440 Moderate protein-calorie malnutrition: Secondary | ICD-10-CM | POA: Diagnosis not present

## 2017-12-31 DIAGNOSIS — G20 Parkinson's disease: Secondary | ICD-10-CM | POA: Diagnosis not present

## 2017-12-31 DIAGNOSIS — G300 Alzheimer's disease with early onset: Secondary | ICD-10-CM | POA: Diagnosis not present

## 2017-12-31 DIAGNOSIS — M6281 Muscle weakness (generalized): Secondary | ICD-10-CM | POA: Diagnosis not present

## 2017-12-31 DIAGNOSIS — R41841 Cognitive communication deficit: Secondary | ICD-10-CM | POA: Diagnosis not present

## 2017-12-31 DIAGNOSIS — M5136 Other intervertebral disc degeneration, lumbar region: Secondary | ICD-10-CM | POA: Diagnosis not present

## 2017-12-31 DIAGNOSIS — F319 Bipolar disorder, unspecified: Secondary | ICD-10-CM | POA: Diagnosis not present

## 2018-01-01 DIAGNOSIS — M6281 Muscle weakness (generalized): Secondary | ICD-10-CM | POA: Diagnosis not present

## 2018-01-01 DIAGNOSIS — G300 Alzheimer's disease with early onset: Secondary | ICD-10-CM | POA: Diagnosis not present

## 2018-01-01 DIAGNOSIS — F319 Bipolar disorder, unspecified: Secondary | ICD-10-CM | POA: Diagnosis not present

## 2018-01-01 DIAGNOSIS — R41841 Cognitive communication deficit: Secondary | ICD-10-CM | POA: Diagnosis not present

## 2018-01-01 DIAGNOSIS — M5136 Other intervertebral disc degeneration, lumbar region: Secondary | ICD-10-CM | POA: Diagnosis not present

## 2018-01-01 DIAGNOSIS — G20 Parkinson's disease: Secondary | ICD-10-CM | POA: Diagnosis not present

## 2018-01-01 DIAGNOSIS — E440 Moderate protein-calorie malnutrition: Secondary | ICD-10-CM | POA: Diagnosis not present

## 2018-01-01 DIAGNOSIS — N186 End stage renal disease: Secondary | ICD-10-CM | POA: Diagnosis not present

## 2018-01-01 DIAGNOSIS — N185 Chronic kidney disease, stage 5: Secondary | ICD-10-CM | POA: Diagnosis not present

## 2018-01-02 DIAGNOSIS — N185 Chronic kidney disease, stage 5: Secondary | ICD-10-CM | POA: Diagnosis not present

## 2018-01-02 DIAGNOSIS — G20 Parkinson's disease: Secondary | ICD-10-CM | POA: Diagnosis not present

## 2018-01-02 DIAGNOSIS — E440 Moderate protein-calorie malnutrition: Secondary | ICD-10-CM | POA: Diagnosis not present

## 2018-01-02 DIAGNOSIS — M6281 Muscle weakness (generalized): Secondary | ICD-10-CM | POA: Diagnosis not present

## 2018-01-02 DIAGNOSIS — G300 Alzheimer's disease with early onset: Secondary | ICD-10-CM | POA: Diagnosis not present

## 2018-01-02 DIAGNOSIS — R41841 Cognitive communication deficit: Secondary | ICD-10-CM | POA: Diagnosis not present

## 2018-01-02 DIAGNOSIS — M5136 Other intervertebral disc degeneration, lumbar region: Secondary | ICD-10-CM | POA: Diagnosis not present

## 2018-01-02 DIAGNOSIS — F319 Bipolar disorder, unspecified: Secondary | ICD-10-CM | POA: Diagnosis not present

## 2018-01-02 DIAGNOSIS — N186 End stage renal disease: Secondary | ICD-10-CM | POA: Diagnosis not present

## 2018-01-03 DIAGNOSIS — M5136 Other intervertebral disc degeneration, lumbar region: Secondary | ICD-10-CM | POA: Diagnosis not present

## 2018-01-03 DIAGNOSIS — N186 End stage renal disease: Secondary | ICD-10-CM | POA: Diagnosis not present

## 2018-01-03 DIAGNOSIS — G300 Alzheimer's disease with early onset: Secondary | ICD-10-CM | POA: Diagnosis not present

## 2018-01-03 DIAGNOSIS — R41841 Cognitive communication deficit: Secondary | ICD-10-CM | POA: Diagnosis not present

## 2018-01-03 DIAGNOSIS — N185 Chronic kidney disease, stage 5: Secondary | ICD-10-CM | POA: Diagnosis not present

## 2018-01-03 DIAGNOSIS — E440 Moderate protein-calorie malnutrition: Secondary | ICD-10-CM | POA: Diagnosis not present

## 2018-01-03 DIAGNOSIS — G20 Parkinson's disease: Secondary | ICD-10-CM | POA: Diagnosis not present

## 2018-01-03 DIAGNOSIS — F319 Bipolar disorder, unspecified: Secondary | ICD-10-CM | POA: Diagnosis not present

## 2018-01-03 DIAGNOSIS — M6281 Muscle weakness (generalized): Secondary | ICD-10-CM | POA: Diagnosis not present

## 2018-01-04 DIAGNOSIS — M6281 Muscle weakness (generalized): Secondary | ICD-10-CM | POA: Diagnosis not present

## 2018-01-04 DIAGNOSIS — G20 Parkinson's disease: Secondary | ICD-10-CM | POA: Diagnosis not present

## 2018-01-04 DIAGNOSIS — F319 Bipolar disorder, unspecified: Secondary | ICD-10-CM | POA: Diagnosis not present

## 2018-01-04 DIAGNOSIS — E440 Moderate protein-calorie malnutrition: Secondary | ICD-10-CM | POA: Diagnosis not present

## 2018-01-04 DIAGNOSIS — G300 Alzheimer's disease with early onset: Secondary | ICD-10-CM | POA: Diagnosis not present

## 2018-01-04 DIAGNOSIS — R41841 Cognitive communication deficit: Secondary | ICD-10-CM | POA: Diagnosis not present

## 2018-01-04 DIAGNOSIS — N185 Chronic kidney disease, stage 5: Secondary | ICD-10-CM | POA: Diagnosis not present

## 2018-01-04 DIAGNOSIS — N186 End stage renal disease: Secondary | ICD-10-CM | POA: Diagnosis not present

## 2018-01-04 DIAGNOSIS — M5136 Other intervertebral disc degeneration, lumbar region: Secondary | ICD-10-CM | POA: Diagnosis not present

## 2018-01-07 DIAGNOSIS — F319 Bipolar disorder, unspecified: Secondary | ICD-10-CM | POA: Diagnosis not present

## 2018-01-07 DIAGNOSIS — E782 Mixed hyperlipidemia: Secondary | ICD-10-CM | POA: Diagnosis not present

## 2018-01-07 DIAGNOSIS — I10 Essential (primary) hypertension: Secondary | ICD-10-CM | POA: Diagnosis not present

## 2018-01-07 DIAGNOSIS — G300 Alzheimer's disease with early onset: Secondary | ICD-10-CM | POA: Diagnosis not present

## 2018-01-07 DIAGNOSIS — E039 Hypothyroidism, unspecified: Secondary | ICD-10-CM | POA: Diagnosis not present

## 2018-01-07 DIAGNOSIS — M6281 Muscle weakness (generalized): Secondary | ICD-10-CM | POA: Diagnosis not present

## 2018-01-07 DIAGNOSIS — G20 Parkinson's disease: Secondary | ICD-10-CM | POA: Diagnosis not present

## 2018-01-07 DIAGNOSIS — E440 Moderate protein-calorie malnutrition: Secondary | ICD-10-CM | POA: Diagnosis not present

## 2018-01-07 DIAGNOSIS — N185 Chronic kidney disease, stage 5: Secondary | ICD-10-CM | POA: Diagnosis not present

## 2018-01-07 DIAGNOSIS — R41841 Cognitive communication deficit: Secondary | ICD-10-CM | POA: Diagnosis not present

## 2018-01-07 DIAGNOSIS — N186 End stage renal disease: Secondary | ICD-10-CM | POA: Diagnosis not present

## 2018-01-07 DIAGNOSIS — N3281 Overactive bladder: Secondary | ICD-10-CM | POA: Diagnosis not present

## 2018-01-07 DIAGNOSIS — M5136 Other intervertebral disc degeneration, lumbar region: Secondary | ICD-10-CM | POA: Diagnosis not present

## 2018-01-08 DIAGNOSIS — R41841 Cognitive communication deficit: Secondary | ICD-10-CM | POA: Diagnosis not present

## 2018-01-08 DIAGNOSIS — N186 End stage renal disease: Secondary | ICD-10-CM | POA: Diagnosis not present

## 2018-01-08 DIAGNOSIS — M5136 Other intervertebral disc degeneration, lumbar region: Secondary | ICD-10-CM | POA: Diagnosis not present

## 2018-01-08 DIAGNOSIS — M6281 Muscle weakness (generalized): Secondary | ICD-10-CM | POA: Diagnosis not present

## 2018-01-08 DIAGNOSIS — G300 Alzheimer's disease with early onset: Secondary | ICD-10-CM | POA: Diagnosis not present

## 2018-01-08 DIAGNOSIS — F319 Bipolar disorder, unspecified: Secondary | ICD-10-CM | POA: Diagnosis not present

## 2018-01-08 DIAGNOSIS — E440 Moderate protein-calorie malnutrition: Secondary | ICD-10-CM | POA: Diagnosis not present

## 2018-01-08 DIAGNOSIS — N185 Chronic kidney disease, stage 5: Secondary | ICD-10-CM | POA: Diagnosis not present

## 2018-01-08 DIAGNOSIS — G20 Parkinson's disease: Secondary | ICD-10-CM | POA: Diagnosis not present

## 2018-01-09 DIAGNOSIS — E440 Moderate protein-calorie malnutrition: Secondary | ICD-10-CM | POA: Diagnosis not present

## 2018-01-09 DIAGNOSIS — N185 Chronic kidney disease, stage 5: Secondary | ICD-10-CM | POA: Diagnosis not present

## 2018-01-09 DIAGNOSIS — M5136 Other intervertebral disc degeneration, lumbar region: Secondary | ICD-10-CM | POA: Diagnosis not present

## 2018-01-09 DIAGNOSIS — G300 Alzheimer's disease with early onset: Secondary | ICD-10-CM | POA: Diagnosis not present

## 2018-01-09 DIAGNOSIS — M6281 Muscle weakness (generalized): Secondary | ICD-10-CM | POA: Diagnosis not present

## 2018-01-09 DIAGNOSIS — G20 Parkinson's disease: Secondary | ICD-10-CM | POA: Diagnosis not present

## 2018-01-09 DIAGNOSIS — N186 End stage renal disease: Secondary | ICD-10-CM | POA: Diagnosis not present

## 2018-01-09 DIAGNOSIS — R41841 Cognitive communication deficit: Secondary | ICD-10-CM | POA: Diagnosis not present

## 2018-01-09 DIAGNOSIS — F319 Bipolar disorder, unspecified: Secondary | ICD-10-CM | POA: Diagnosis not present

## 2018-01-10 DIAGNOSIS — E440 Moderate protein-calorie malnutrition: Secondary | ICD-10-CM | POA: Diagnosis not present

## 2018-01-10 DIAGNOSIS — G300 Alzheimer's disease with early onset: Secondary | ICD-10-CM | POA: Diagnosis not present

## 2018-01-10 DIAGNOSIS — M6281 Muscle weakness (generalized): Secondary | ICD-10-CM | POA: Diagnosis not present

## 2018-01-10 DIAGNOSIS — M5136 Other intervertebral disc degeneration, lumbar region: Secondary | ICD-10-CM | POA: Diagnosis not present

## 2018-01-10 DIAGNOSIS — F319 Bipolar disorder, unspecified: Secondary | ICD-10-CM | POA: Diagnosis not present

## 2018-01-10 DIAGNOSIS — N186 End stage renal disease: Secondary | ICD-10-CM | POA: Diagnosis not present

## 2018-01-10 DIAGNOSIS — R41841 Cognitive communication deficit: Secondary | ICD-10-CM | POA: Diagnosis not present

## 2018-01-10 DIAGNOSIS — G20 Parkinson's disease: Secondary | ICD-10-CM | POA: Diagnosis not present

## 2018-01-10 DIAGNOSIS — N185 Chronic kidney disease, stage 5: Secondary | ICD-10-CM | POA: Diagnosis not present

## 2018-01-11 DIAGNOSIS — G20 Parkinson's disease: Secondary | ICD-10-CM | POA: Diagnosis not present

## 2018-01-11 DIAGNOSIS — N186 End stage renal disease: Secondary | ICD-10-CM | POA: Diagnosis not present

## 2018-01-11 DIAGNOSIS — F319 Bipolar disorder, unspecified: Secondary | ICD-10-CM | POA: Diagnosis not present

## 2018-01-11 DIAGNOSIS — M6281 Muscle weakness (generalized): Secondary | ICD-10-CM | POA: Diagnosis not present

## 2018-01-11 DIAGNOSIS — G300 Alzheimer's disease with early onset: Secondary | ICD-10-CM | POA: Diagnosis not present

## 2018-01-11 DIAGNOSIS — M5136 Other intervertebral disc degeneration, lumbar region: Secondary | ICD-10-CM | POA: Diagnosis not present

## 2018-01-11 DIAGNOSIS — E440 Moderate protein-calorie malnutrition: Secondary | ICD-10-CM | POA: Diagnosis not present

## 2018-01-11 DIAGNOSIS — R41841 Cognitive communication deficit: Secondary | ICD-10-CM | POA: Diagnosis not present

## 2018-01-11 DIAGNOSIS — N185 Chronic kidney disease, stage 5: Secondary | ICD-10-CM | POA: Diagnosis not present

## 2018-01-14 DIAGNOSIS — F0281 Dementia in other diseases classified elsewhere with behavioral disturbance: Secondary | ICD-10-CM | POA: Diagnosis not present

## 2018-01-14 DIAGNOSIS — E440 Moderate protein-calorie malnutrition: Secondary | ICD-10-CM | POA: Diagnosis not present

## 2018-01-14 DIAGNOSIS — R41841 Cognitive communication deficit: Secondary | ICD-10-CM | POA: Diagnosis not present

## 2018-01-14 DIAGNOSIS — F319 Bipolar disorder, unspecified: Secondary | ICD-10-CM | POA: Diagnosis not present

## 2018-01-14 DIAGNOSIS — M5136 Other intervertebral disc degeneration, lumbar region: Secondary | ICD-10-CM | POA: Diagnosis not present

## 2018-01-14 DIAGNOSIS — N186 End stage renal disease: Secondary | ICD-10-CM | POA: Diagnosis not present

## 2018-01-14 DIAGNOSIS — N185 Chronic kidney disease, stage 5: Secondary | ICD-10-CM | POA: Diagnosis not present

## 2018-01-14 DIAGNOSIS — G300 Alzheimer's disease with early onset: Secondary | ICD-10-CM | POA: Diagnosis not present

## 2018-01-14 DIAGNOSIS — R600 Localized edema: Secondary | ICD-10-CM | POA: Diagnosis not present

## 2018-01-14 DIAGNOSIS — M6281 Muscle weakness (generalized): Secondary | ICD-10-CM | POA: Diagnosis not present

## 2018-01-14 DIAGNOSIS — G20 Parkinson's disease: Secondary | ICD-10-CM | POA: Diagnosis not present

## 2018-01-15 DIAGNOSIS — E440 Moderate protein-calorie malnutrition: Secondary | ICD-10-CM | POA: Diagnosis not present

## 2018-01-15 DIAGNOSIS — F319 Bipolar disorder, unspecified: Secondary | ICD-10-CM | POA: Diagnosis not present

## 2018-01-15 DIAGNOSIS — M5136 Other intervertebral disc degeneration, lumbar region: Secondary | ICD-10-CM | POA: Diagnosis not present

## 2018-01-15 DIAGNOSIS — F411 Generalized anxiety disorder: Secondary | ICD-10-CM | POA: Diagnosis not present

## 2018-01-15 DIAGNOSIS — R41841 Cognitive communication deficit: Secondary | ICD-10-CM | POA: Diagnosis not present

## 2018-01-15 DIAGNOSIS — G300 Alzheimer's disease with early onset: Secondary | ICD-10-CM | POA: Diagnosis not present

## 2018-01-15 DIAGNOSIS — M6281 Muscle weakness (generalized): Secondary | ICD-10-CM | POA: Diagnosis not present

## 2018-01-15 DIAGNOSIS — G20 Parkinson's disease: Secondary | ICD-10-CM | POA: Diagnosis not present

## 2018-01-15 DIAGNOSIS — N185 Chronic kidney disease, stage 5: Secondary | ICD-10-CM | POA: Diagnosis not present

## 2018-01-15 DIAGNOSIS — N186 End stage renal disease: Secondary | ICD-10-CM | POA: Diagnosis not present

## 2018-01-16 DIAGNOSIS — R41841 Cognitive communication deficit: Secondary | ICD-10-CM | POA: Diagnosis not present

## 2018-01-16 DIAGNOSIS — M5136 Other intervertebral disc degeneration, lumbar region: Secondary | ICD-10-CM | POA: Diagnosis not present

## 2018-01-16 DIAGNOSIS — N186 End stage renal disease: Secondary | ICD-10-CM | POA: Diagnosis not present

## 2018-01-16 DIAGNOSIS — G300 Alzheimer's disease with early onset: Secondary | ICD-10-CM | POA: Diagnosis not present

## 2018-01-16 DIAGNOSIS — G20 Parkinson's disease: Secondary | ICD-10-CM | POA: Diagnosis not present

## 2018-01-16 DIAGNOSIS — M6281 Muscle weakness (generalized): Secondary | ICD-10-CM | POA: Diagnosis not present

## 2018-01-16 DIAGNOSIS — F319 Bipolar disorder, unspecified: Secondary | ICD-10-CM | POA: Diagnosis not present

## 2018-01-16 DIAGNOSIS — E440 Moderate protein-calorie malnutrition: Secondary | ICD-10-CM | POA: Diagnosis not present

## 2018-01-16 DIAGNOSIS — N185 Chronic kidney disease, stage 5: Secondary | ICD-10-CM | POA: Diagnosis not present

## 2018-01-17 DIAGNOSIS — R41841 Cognitive communication deficit: Secondary | ICD-10-CM | POA: Diagnosis not present

## 2018-01-17 DIAGNOSIS — N186 End stage renal disease: Secondary | ICD-10-CM | POA: Diagnosis not present

## 2018-01-17 DIAGNOSIS — N185 Chronic kidney disease, stage 5: Secondary | ICD-10-CM | POA: Diagnosis not present

## 2018-01-17 DIAGNOSIS — G20 Parkinson's disease: Secondary | ICD-10-CM | POA: Diagnosis not present

## 2018-01-17 DIAGNOSIS — F319 Bipolar disorder, unspecified: Secondary | ICD-10-CM | POA: Diagnosis not present

## 2018-01-17 DIAGNOSIS — M5136 Other intervertebral disc degeneration, lumbar region: Secondary | ICD-10-CM | POA: Diagnosis not present

## 2018-01-17 DIAGNOSIS — G300 Alzheimer's disease with early onset: Secondary | ICD-10-CM | POA: Diagnosis not present

## 2018-01-17 DIAGNOSIS — E440 Moderate protein-calorie malnutrition: Secondary | ICD-10-CM | POA: Diagnosis not present

## 2018-01-17 DIAGNOSIS — M6281 Muscle weakness (generalized): Secondary | ICD-10-CM | POA: Diagnosis not present

## 2018-01-18 DIAGNOSIS — D649 Anemia, unspecified: Secondary | ICD-10-CM | POA: Diagnosis not present

## 2018-01-18 DIAGNOSIS — R41841 Cognitive communication deficit: Secondary | ICD-10-CM | POA: Diagnosis not present

## 2018-01-18 DIAGNOSIS — Z79899 Other long term (current) drug therapy: Secondary | ICD-10-CM | POA: Diagnosis not present

## 2018-01-18 DIAGNOSIS — I10 Essential (primary) hypertension: Secondary | ICD-10-CM | POA: Diagnosis not present

## 2018-01-18 DIAGNOSIS — N186 End stage renal disease: Secondary | ICD-10-CM | POA: Diagnosis not present

## 2018-01-18 DIAGNOSIS — G300 Alzheimer's disease with early onset: Secondary | ICD-10-CM | POA: Diagnosis not present

## 2018-01-18 DIAGNOSIS — M6281 Muscle weakness (generalized): Secondary | ICD-10-CM | POA: Diagnosis not present

## 2018-01-18 DIAGNOSIS — N185 Chronic kidney disease, stage 5: Secondary | ICD-10-CM | POA: Diagnosis not present

## 2018-01-18 DIAGNOSIS — E440 Moderate protein-calorie malnutrition: Secondary | ICD-10-CM | POA: Diagnosis not present

## 2018-01-18 DIAGNOSIS — F319 Bipolar disorder, unspecified: Secondary | ICD-10-CM | POA: Diagnosis not present

## 2018-01-18 DIAGNOSIS — G20 Parkinson's disease: Secondary | ICD-10-CM | POA: Diagnosis not present

## 2018-01-18 DIAGNOSIS — M5136 Other intervertebral disc degeneration, lumbar region: Secondary | ICD-10-CM | POA: Diagnosis not present

## 2018-01-20 DIAGNOSIS — N186 End stage renal disease: Secondary | ICD-10-CM | POA: Diagnosis not present

## 2018-01-20 DIAGNOSIS — M6281 Muscle weakness (generalized): Secondary | ICD-10-CM | POA: Diagnosis not present

## 2018-01-20 DIAGNOSIS — N185 Chronic kidney disease, stage 5: Secondary | ICD-10-CM | POA: Diagnosis not present

## 2018-01-20 DIAGNOSIS — R41841 Cognitive communication deficit: Secondary | ICD-10-CM | POA: Diagnosis not present

## 2018-01-20 DIAGNOSIS — G300 Alzheimer's disease with early onset: Secondary | ICD-10-CM | POA: Diagnosis not present

## 2018-01-20 DIAGNOSIS — E440 Moderate protein-calorie malnutrition: Secondary | ICD-10-CM | POA: Diagnosis not present

## 2018-01-20 DIAGNOSIS — G20 Parkinson's disease: Secondary | ICD-10-CM | POA: Diagnosis not present

## 2018-01-20 DIAGNOSIS — M5136 Other intervertebral disc degeneration, lumbar region: Secondary | ICD-10-CM | POA: Diagnosis not present

## 2018-01-20 DIAGNOSIS — F319 Bipolar disorder, unspecified: Secondary | ICD-10-CM | POA: Diagnosis not present

## 2018-01-21 DIAGNOSIS — N186 End stage renal disease: Secondary | ICD-10-CM | POA: Diagnosis not present

## 2018-01-21 DIAGNOSIS — G20 Parkinson's disease: Secondary | ICD-10-CM | POA: Diagnosis not present

## 2018-01-21 DIAGNOSIS — M6281 Muscle weakness (generalized): Secondary | ICD-10-CM | POA: Diagnosis not present

## 2018-01-21 DIAGNOSIS — M5136 Other intervertebral disc degeneration, lumbar region: Secondary | ICD-10-CM | POA: Diagnosis not present

## 2018-01-21 DIAGNOSIS — G300 Alzheimer's disease with early onset: Secondary | ICD-10-CM | POA: Diagnosis not present

## 2018-01-21 DIAGNOSIS — F319 Bipolar disorder, unspecified: Secondary | ICD-10-CM | POA: Diagnosis not present

## 2018-01-21 DIAGNOSIS — R41841 Cognitive communication deficit: Secondary | ICD-10-CM | POA: Diagnosis not present

## 2018-01-21 DIAGNOSIS — E440 Moderate protein-calorie malnutrition: Secondary | ICD-10-CM | POA: Diagnosis not present

## 2018-01-21 DIAGNOSIS — N185 Chronic kidney disease, stage 5: Secondary | ICD-10-CM | POA: Diagnosis not present

## 2018-01-22 DIAGNOSIS — M6281 Muscle weakness (generalized): Secondary | ICD-10-CM | POA: Diagnosis not present

## 2018-01-22 DIAGNOSIS — F319 Bipolar disorder, unspecified: Secondary | ICD-10-CM | POA: Diagnosis not present

## 2018-01-22 DIAGNOSIS — F3175 Bipolar disorder, in partial remission, most recent episode depressed: Secondary | ICD-10-CM | POA: Diagnosis not present

## 2018-01-22 DIAGNOSIS — F411 Generalized anxiety disorder: Secondary | ICD-10-CM | POA: Diagnosis not present

## 2018-01-22 DIAGNOSIS — M5136 Other intervertebral disc degeneration, lumbar region: Secondary | ICD-10-CM | POA: Diagnosis not present

## 2018-01-22 DIAGNOSIS — G300 Alzheimer's disease with early onset: Secondary | ICD-10-CM | POA: Diagnosis not present

## 2018-01-22 DIAGNOSIS — E440 Moderate protein-calorie malnutrition: Secondary | ICD-10-CM | POA: Diagnosis not present

## 2018-01-22 DIAGNOSIS — G20 Parkinson's disease: Secondary | ICD-10-CM | POA: Diagnosis not present

## 2018-01-22 DIAGNOSIS — R41841 Cognitive communication deficit: Secondary | ICD-10-CM | POA: Diagnosis not present

## 2018-01-22 DIAGNOSIS — N185 Chronic kidney disease, stage 5: Secondary | ICD-10-CM | POA: Diagnosis not present

## 2018-01-22 DIAGNOSIS — N186 End stage renal disease: Secondary | ICD-10-CM | POA: Diagnosis not present

## 2018-01-23 DIAGNOSIS — N186 End stage renal disease: Secondary | ICD-10-CM | POA: Diagnosis not present

## 2018-01-23 DIAGNOSIS — M5136 Other intervertebral disc degeneration, lumbar region: Secondary | ICD-10-CM | POA: Diagnosis not present

## 2018-01-23 DIAGNOSIS — F319 Bipolar disorder, unspecified: Secondary | ICD-10-CM | POA: Diagnosis not present

## 2018-01-23 DIAGNOSIS — G300 Alzheimer's disease with early onset: Secondary | ICD-10-CM | POA: Diagnosis not present

## 2018-01-23 DIAGNOSIS — E440 Moderate protein-calorie malnutrition: Secondary | ICD-10-CM | POA: Diagnosis not present

## 2018-01-23 DIAGNOSIS — R41841 Cognitive communication deficit: Secondary | ICD-10-CM | POA: Diagnosis not present

## 2018-01-23 DIAGNOSIS — M6281 Muscle weakness (generalized): Secondary | ICD-10-CM | POA: Diagnosis not present

## 2018-01-23 DIAGNOSIS — N185 Chronic kidney disease, stage 5: Secondary | ICD-10-CM | POA: Diagnosis not present

## 2018-01-23 DIAGNOSIS — G20 Parkinson's disease: Secondary | ICD-10-CM | POA: Diagnosis not present

## 2018-01-24 DIAGNOSIS — M6281 Muscle weakness (generalized): Secondary | ICD-10-CM | POA: Diagnosis not present

## 2018-01-24 DIAGNOSIS — N186 End stage renal disease: Secondary | ICD-10-CM | POA: Diagnosis not present

## 2018-01-24 DIAGNOSIS — M5136 Other intervertebral disc degeneration, lumbar region: Secondary | ICD-10-CM | POA: Diagnosis not present

## 2018-01-24 DIAGNOSIS — F319 Bipolar disorder, unspecified: Secondary | ICD-10-CM | POA: Diagnosis not present

## 2018-01-24 DIAGNOSIS — R41841 Cognitive communication deficit: Secondary | ICD-10-CM | POA: Diagnosis not present

## 2018-01-24 DIAGNOSIS — E440 Moderate protein-calorie malnutrition: Secondary | ICD-10-CM | POA: Diagnosis not present

## 2018-01-24 DIAGNOSIS — G20 Parkinson's disease: Secondary | ICD-10-CM | POA: Diagnosis not present

## 2018-01-24 DIAGNOSIS — N185 Chronic kidney disease, stage 5: Secondary | ICD-10-CM | POA: Diagnosis not present

## 2018-01-24 DIAGNOSIS — G300 Alzheimer's disease with early onset: Secondary | ICD-10-CM | POA: Diagnosis not present

## 2018-01-25 DIAGNOSIS — F319 Bipolar disorder, unspecified: Secondary | ICD-10-CM | POA: Diagnosis not present

## 2018-01-25 DIAGNOSIS — M5136 Other intervertebral disc degeneration, lumbar region: Secondary | ICD-10-CM | POA: Diagnosis not present

## 2018-01-25 DIAGNOSIS — R600 Localized edema: Secondary | ICD-10-CM | POA: Diagnosis not present

## 2018-01-25 DIAGNOSIS — N186 End stage renal disease: Secondary | ICD-10-CM | POA: Diagnosis not present

## 2018-01-25 DIAGNOSIS — R41841 Cognitive communication deficit: Secondary | ICD-10-CM | POA: Diagnosis not present

## 2018-01-25 DIAGNOSIS — G20 Parkinson's disease: Secondary | ICD-10-CM | POA: Diagnosis not present

## 2018-01-25 DIAGNOSIS — M6281 Muscle weakness (generalized): Secondary | ICD-10-CM | POA: Diagnosis not present

## 2018-01-25 DIAGNOSIS — E440 Moderate protein-calorie malnutrition: Secondary | ICD-10-CM | POA: Diagnosis not present

## 2018-01-25 DIAGNOSIS — N185 Chronic kidney disease, stage 5: Secondary | ICD-10-CM | POA: Diagnosis not present

## 2018-01-25 DIAGNOSIS — F0281 Dementia in other diseases classified elsewhere with behavioral disturbance: Secondary | ICD-10-CM | POA: Diagnosis not present

## 2018-01-25 DIAGNOSIS — I10 Essential (primary) hypertension: Secondary | ICD-10-CM | POA: Diagnosis not present

## 2018-01-25 DIAGNOSIS — G300 Alzheimer's disease with early onset: Secondary | ICD-10-CM | POA: Diagnosis not present

## 2018-01-29 DIAGNOSIS — E440 Moderate protein-calorie malnutrition: Secondary | ICD-10-CM | POA: Diagnosis not present

## 2018-01-29 DIAGNOSIS — N186 End stage renal disease: Secondary | ICD-10-CM | POA: Diagnosis not present

## 2018-01-29 DIAGNOSIS — M6281 Muscle weakness (generalized): Secondary | ICD-10-CM | POA: Diagnosis not present

## 2018-01-29 DIAGNOSIS — G20 Parkinson's disease: Secondary | ICD-10-CM | POA: Diagnosis not present

## 2018-01-29 DIAGNOSIS — F319 Bipolar disorder, unspecified: Secondary | ICD-10-CM | POA: Diagnosis not present

## 2018-01-29 DIAGNOSIS — M5136 Other intervertebral disc degeneration, lumbar region: Secondary | ICD-10-CM | POA: Diagnosis not present

## 2018-01-29 DIAGNOSIS — N185 Chronic kidney disease, stage 5: Secondary | ICD-10-CM | POA: Diagnosis not present

## 2018-01-29 DIAGNOSIS — R41841 Cognitive communication deficit: Secondary | ICD-10-CM | POA: Diagnosis not present

## 2018-01-29 DIAGNOSIS — G300 Alzheimer's disease with early onset: Secondary | ICD-10-CM | POA: Diagnosis not present

## 2018-01-30 DIAGNOSIS — E440 Moderate protein-calorie malnutrition: Secondary | ICD-10-CM | POA: Diagnosis not present

## 2018-01-30 DIAGNOSIS — G300 Alzheimer's disease with early onset: Secondary | ICD-10-CM | POA: Diagnosis not present

## 2018-01-30 DIAGNOSIS — G20 Parkinson's disease: Secondary | ICD-10-CM | POA: Diagnosis not present

## 2018-01-30 DIAGNOSIS — M6281 Muscle weakness (generalized): Secondary | ICD-10-CM | POA: Diagnosis not present

## 2018-01-30 DIAGNOSIS — M5136 Other intervertebral disc degeneration, lumbar region: Secondary | ICD-10-CM | POA: Diagnosis not present

## 2018-01-30 DIAGNOSIS — F319 Bipolar disorder, unspecified: Secondary | ICD-10-CM | POA: Diagnosis not present

## 2018-01-30 DIAGNOSIS — N186 End stage renal disease: Secondary | ICD-10-CM | POA: Diagnosis not present

## 2018-01-30 DIAGNOSIS — N185 Chronic kidney disease, stage 5: Secondary | ICD-10-CM | POA: Diagnosis not present

## 2018-01-30 DIAGNOSIS — R41841 Cognitive communication deficit: Secondary | ICD-10-CM | POA: Diagnosis not present

## 2018-01-31 DIAGNOSIS — F319 Bipolar disorder, unspecified: Secondary | ICD-10-CM | POA: Diagnosis not present

## 2018-01-31 DIAGNOSIS — M5136 Other intervertebral disc degeneration, lumbar region: Secondary | ICD-10-CM | POA: Diagnosis not present

## 2018-01-31 DIAGNOSIS — E440 Moderate protein-calorie malnutrition: Secondary | ICD-10-CM | POA: Diagnosis not present

## 2018-01-31 DIAGNOSIS — M6281 Muscle weakness (generalized): Secondary | ICD-10-CM | POA: Diagnosis not present

## 2018-01-31 DIAGNOSIS — N185 Chronic kidney disease, stage 5: Secondary | ICD-10-CM | POA: Diagnosis not present

## 2018-01-31 DIAGNOSIS — R41841 Cognitive communication deficit: Secondary | ICD-10-CM | POA: Diagnosis not present

## 2018-01-31 DIAGNOSIS — N186 End stage renal disease: Secondary | ICD-10-CM | POA: Diagnosis not present

## 2018-01-31 DIAGNOSIS — G20 Parkinson's disease: Secondary | ICD-10-CM | POA: Diagnosis not present

## 2018-01-31 DIAGNOSIS — G300 Alzheimer's disease with early onset: Secondary | ICD-10-CM | POA: Diagnosis not present

## 2018-02-01 DIAGNOSIS — G300 Alzheimer's disease with early onset: Secondary | ICD-10-CM | POA: Diagnosis not present

## 2018-02-01 DIAGNOSIS — M5136 Other intervertebral disc degeneration, lumbar region: Secondary | ICD-10-CM | POA: Diagnosis not present

## 2018-02-01 DIAGNOSIS — F319 Bipolar disorder, unspecified: Secondary | ICD-10-CM | POA: Diagnosis not present

## 2018-02-01 DIAGNOSIS — N185 Chronic kidney disease, stage 5: Secondary | ICD-10-CM | POA: Diagnosis not present

## 2018-02-01 DIAGNOSIS — R41841 Cognitive communication deficit: Secondary | ICD-10-CM | POA: Diagnosis not present

## 2018-02-01 DIAGNOSIS — N186 End stage renal disease: Secondary | ICD-10-CM | POA: Diagnosis not present

## 2018-02-01 DIAGNOSIS — E440 Moderate protein-calorie malnutrition: Secondary | ICD-10-CM | POA: Diagnosis not present

## 2018-02-01 DIAGNOSIS — M6281 Muscle weakness (generalized): Secondary | ICD-10-CM | POA: Diagnosis not present

## 2018-02-01 DIAGNOSIS — G20 Parkinson's disease: Secondary | ICD-10-CM | POA: Diagnosis not present

## 2018-02-04 DIAGNOSIS — G300 Alzheimer's disease with early onset: Secondary | ICD-10-CM | POA: Diagnosis not present

## 2018-02-04 DIAGNOSIS — F319 Bipolar disorder, unspecified: Secondary | ICD-10-CM | POA: Diagnosis not present

## 2018-02-04 DIAGNOSIS — N185 Chronic kidney disease, stage 5: Secondary | ICD-10-CM | POA: Diagnosis not present

## 2018-02-04 DIAGNOSIS — M6281 Muscle weakness (generalized): Secondary | ICD-10-CM | POA: Diagnosis not present

## 2018-02-04 DIAGNOSIS — R41841 Cognitive communication deficit: Secondary | ICD-10-CM | POA: Diagnosis not present

## 2018-02-04 DIAGNOSIS — E440 Moderate protein-calorie malnutrition: Secondary | ICD-10-CM | POA: Diagnosis not present

## 2018-02-04 DIAGNOSIS — N186 End stage renal disease: Secondary | ICD-10-CM | POA: Diagnosis not present

## 2018-02-04 DIAGNOSIS — G20 Parkinson's disease: Secondary | ICD-10-CM | POA: Diagnosis not present

## 2018-02-04 DIAGNOSIS — M5136 Other intervertebral disc degeneration, lumbar region: Secondary | ICD-10-CM | POA: Diagnosis not present

## 2018-02-05 DIAGNOSIS — M5136 Other intervertebral disc degeneration, lumbar region: Secondary | ICD-10-CM | POA: Diagnosis not present

## 2018-02-05 DIAGNOSIS — N186 End stage renal disease: Secondary | ICD-10-CM | POA: Diagnosis not present

## 2018-02-05 DIAGNOSIS — F319 Bipolar disorder, unspecified: Secondary | ICD-10-CM | POA: Diagnosis not present

## 2018-02-05 DIAGNOSIS — G20 Parkinson's disease: Secondary | ICD-10-CM | POA: Diagnosis not present

## 2018-02-05 DIAGNOSIS — N259 Disorder resulting from impaired renal tubular function, unspecified: Secondary | ICD-10-CM | POA: Diagnosis not present

## 2018-02-05 DIAGNOSIS — G300 Alzheimer's disease with early onset: Secondary | ICD-10-CM | POA: Diagnosis not present

## 2018-02-05 DIAGNOSIS — N185 Chronic kidney disease, stage 5: Secondary | ICD-10-CM | POA: Diagnosis not present

## 2018-02-05 DIAGNOSIS — M6281 Muscle weakness (generalized): Secondary | ICD-10-CM | POA: Diagnosis not present

## 2018-02-05 DIAGNOSIS — D649 Anemia, unspecified: Secondary | ICD-10-CM | POA: Diagnosis not present

## 2018-02-05 DIAGNOSIS — E440 Moderate protein-calorie malnutrition: Secondary | ICD-10-CM | POA: Diagnosis not present

## 2018-02-05 DIAGNOSIS — R41841 Cognitive communication deficit: Secondary | ICD-10-CM | POA: Diagnosis not present

## 2018-02-06 DIAGNOSIS — R41841 Cognitive communication deficit: Secondary | ICD-10-CM | POA: Diagnosis not present

## 2018-02-06 DIAGNOSIS — M6281 Muscle weakness (generalized): Secondary | ICD-10-CM | POA: Diagnosis not present

## 2018-02-06 DIAGNOSIS — G300 Alzheimer's disease with early onset: Secondary | ICD-10-CM | POA: Diagnosis not present

## 2018-02-06 DIAGNOSIS — N185 Chronic kidney disease, stage 5: Secondary | ICD-10-CM | POA: Diagnosis not present

## 2018-02-06 DIAGNOSIS — M5136 Other intervertebral disc degeneration, lumbar region: Secondary | ICD-10-CM | POA: Diagnosis not present

## 2018-02-06 DIAGNOSIS — E440 Moderate protein-calorie malnutrition: Secondary | ICD-10-CM | POA: Diagnosis not present

## 2018-02-06 DIAGNOSIS — F319 Bipolar disorder, unspecified: Secondary | ICD-10-CM | POA: Diagnosis not present

## 2018-02-06 DIAGNOSIS — N186 End stage renal disease: Secondary | ICD-10-CM | POA: Diagnosis not present

## 2018-02-06 DIAGNOSIS — F411 Generalized anxiety disorder: Secondary | ICD-10-CM | POA: Diagnosis not present

## 2018-02-06 DIAGNOSIS — F3175 Bipolar disorder, in partial remission, most recent episode depressed: Secondary | ICD-10-CM | POA: Diagnosis not present

## 2018-02-06 DIAGNOSIS — G20 Parkinson's disease: Secondary | ICD-10-CM | POA: Diagnosis not present

## 2018-02-13 DIAGNOSIS — F419 Anxiety disorder, unspecified: Secondary | ICD-10-CM | POA: Diagnosis not present

## 2018-02-19 DIAGNOSIS — F0281 Dementia in other diseases classified elsewhere with behavioral disturbance: Secondary | ICD-10-CM | POA: Diagnosis not present

## 2018-02-19 DIAGNOSIS — G8929 Other chronic pain: Secondary | ICD-10-CM | POA: Diagnosis not present

## 2018-02-19 DIAGNOSIS — I10 Essential (primary) hypertension: Secondary | ICD-10-CM | POA: Diagnosis not present

## 2018-02-19 DIAGNOSIS — F329 Major depressive disorder, single episode, unspecified: Secondary | ICD-10-CM | POA: Diagnosis not present

## 2018-02-19 DIAGNOSIS — F419 Anxiety disorder, unspecified: Secondary | ICD-10-CM | POA: Diagnosis not present

## 2018-02-19 DIAGNOSIS — G20 Parkinson's disease: Secondary | ICD-10-CM | POA: Diagnosis not present

## 2018-02-19 DIAGNOSIS — F319 Bipolar disorder, unspecified: Secondary | ICD-10-CM | POA: Diagnosis not present

## 2018-02-20 DIAGNOSIS — E039 Hypothyroidism, unspecified: Secondary | ICD-10-CM | POA: Diagnosis not present

## 2018-02-20 DIAGNOSIS — E785 Hyperlipidemia, unspecified: Secondary | ICD-10-CM | POA: Diagnosis not present

## 2018-02-20 DIAGNOSIS — N259 Disorder resulting from impaired renal tubular function, unspecified: Secondary | ICD-10-CM | POA: Diagnosis not present

## 2018-02-21 DIAGNOSIS — D649 Anemia, unspecified: Secondary | ICD-10-CM | POA: Diagnosis not present

## 2018-02-21 DIAGNOSIS — N3281 Overactive bladder: Secondary | ICD-10-CM | POA: Diagnosis not present

## 2018-02-21 DIAGNOSIS — Z79899 Other long term (current) drug therapy: Secondary | ICD-10-CM | POA: Diagnosis not present

## 2018-02-21 DIAGNOSIS — E119 Type 2 diabetes mellitus without complications: Secondary | ICD-10-CM | POA: Diagnosis not present

## 2018-02-21 DIAGNOSIS — G8929 Other chronic pain: Secondary | ICD-10-CM | POA: Diagnosis not present

## 2018-02-21 DIAGNOSIS — I10 Essential (primary) hypertension: Secondary | ICD-10-CM | POA: Diagnosis not present

## 2018-02-21 DIAGNOSIS — F0281 Dementia in other diseases classified elsewhere with behavioral disturbance: Secondary | ICD-10-CM | POA: Diagnosis not present

## 2018-02-21 DIAGNOSIS — G20 Parkinson's disease: Secondary | ICD-10-CM | POA: Diagnosis not present

## 2018-02-26 DIAGNOSIS — F319 Bipolar disorder, unspecified: Secondary | ICD-10-CM | POA: Diagnosis not present

## 2018-02-26 DIAGNOSIS — F329 Major depressive disorder, single episode, unspecified: Secondary | ICD-10-CM | POA: Diagnosis not present

## 2018-02-26 DIAGNOSIS — F419 Anxiety disorder, unspecified: Secondary | ICD-10-CM | POA: Diagnosis not present

## 2018-03-01 DIAGNOSIS — H548 Legal blindness, as defined in USA: Secondary | ICD-10-CM | POA: Diagnosis not present

## 2018-03-01 DIAGNOSIS — H524 Presbyopia: Secondary | ICD-10-CM | POA: Diagnosis not present

## 2018-03-01 DIAGNOSIS — H353133 Nonexudative age-related macular degeneration, bilateral, advanced atrophic without subfoveal involvement: Secondary | ICD-10-CM | POA: Diagnosis not present

## 2018-03-05 DIAGNOSIS — F329 Major depressive disorder, single episode, unspecified: Secondary | ICD-10-CM | POA: Diagnosis not present

## 2018-03-05 DIAGNOSIS — F319 Bipolar disorder, unspecified: Secondary | ICD-10-CM | POA: Diagnosis not present

## 2018-03-05 DIAGNOSIS — F419 Anxiety disorder, unspecified: Secondary | ICD-10-CM | POA: Diagnosis not present

## 2018-03-11 DIAGNOSIS — F319 Bipolar disorder, unspecified: Secondary | ICD-10-CM | POA: Diagnosis not present

## 2018-03-11 DIAGNOSIS — F329 Major depressive disorder, single episode, unspecified: Secondary | ICD-10-CM | POA: Diagnosis not present

## 2018-03-11 DIAGNOSIS — F419 Anxiety disorder, unspecified: Secondary | ICD-10-CM | POA: Diagnosis not present

## 2018-03-12 DIAGNOSIS — G20 Parkinson's disease: Secondary | ICD-10-CM | POA: Diagnosis not present

## 2018-03-12 DIAGNOSIS — N390 Urinary tract infection, site not specified: Secondary | ICD-10-CM | POA: Diagnosis not present

## 2018-03-12 DIAGNOSIS — G8929 Other chronic pain: Secondary | ICD-10-CM | POA: Diagnosis not present

## 2018-03-12 DIAGNOSIS — E039 Hypothyroidism, unspecified: Secondary | ICD-10-CM | POA: Diagnosis not present

## 2018-03-14 DIAGNOSIS — N390 Urinary tract infection, site not specified: Secondary | ICD-10-CM | POA: Diagnosis not present

## 2018-03-14 DIAGNOSIS — D649 Anemia, unspecified: Secondary | ICD-10-CM | POA: Diagnosis not present

## 2018-03-14 DIAGNOSIS — R319 Hematuria, unspecified: Secondary | ICD-10-CM | POA: Diagnosis not present

## 2018-03-14 DIAGNOSIS — Z79899 Other long term (current) drug therapy: Secondary | ICD-10-CM | POA: Diagnosis not present

## 2018-03-15 DIAGNOSIS — L03115 Cellulitis of right lower limb: Secondary | ICD-10-CM | POA: Diagnosis not present

## 2018-03-15 DIAGNOSIS — D649 Anemia, unspecified: Secondary | ICD-10-CM | POA: Diagnosis not present

## 2018-03-15 DIAGNOSIS — I10 Essential (primary) hypertension: Secondary | ICD-10-CM | POA: Diagnosis not present

## 2018-03-18 DIAGNOSIS — F419 Anxiety disorder, unspecified: Secondary | ICD-10-CM | POA: Diagnosis not present

## 2018-03-18 DIAGNOSIS — F319 Bipolar disorder, unspecified: Secondary | ICD-10-CM | POA: Diagnosis not present

## 2018-03-18 DIAGNOSIS — F329 Major depressive disorder, single episode, unspecified: Secondary | ICD-10-CM | POA: Diagnosis not present

## 2018-03-21 DIAGNOSIS — N390 Urinary tract infection, site not specified: Secondary | ICD-10-CM | POA: Diagnosis not present

## 2018-03-21 DIAGNOSIS — G8929 Other chronic pain: Secondary | ICD-10-CM | POA: Diagnosis not present

## 2018-03-21 DIAGNOSIS — F0281 Dementia in other diseases classified elsewhere with behavioral disturbance: Secondary | ICD-10-CM | POA: Diagnosis not present

## 2018-03-21 DIAGNOSIS — G20 Parkinson's disease: Secondary | ICD-10-CM | POA: Diagnosis not present

## 2018-03-22 DIAGNOSIS — M6281 Muscle weakness (generalized): Secondary | ICD-10-CM | POA: Diagnosis not present

## 2018-03-22 DIAGNOSIS — G300 Alzheimer's disease with early onset: Secondary | ICD-10-CM | POA: Diagnosis not present

## 2018-03-22 DIAGNOSIS — R2689 Other abnormalities of gait and mobility: Secondary | ICD-10-CM | POA: Diagnosis not present

## 2018-03-22 DIAGNOSIS — G20 Parkinson's disease: Secondary | ICD-10-CM | POA: Diagnosis not present

## 2018-03-22 DIAGNOSIS — F319 Bipolar disorder, unspecified: Secondary | ICD-10-CM | POA: Diagnosis not present

## 2018-03-23 DIAGNOSIS — F319 Bipolar disorder, unspecified: Secondary | ICD-10-CM | POA: Diagnosis not present

## 2018-03-23 DIAGNOSIS — M6281 Muscle weakness (generalized): Secondary | ICD-10-CM | POA: Diagnosis not present

## 2018-03-23 DIAGNOSIS — G20 Parkinson's disease: Secondary | ICD-10-CM | POA: Diagnosis not present

## 2018-03-23 DIAGNOSIS — G300 Alzheimer's disease with early onset: Secondary | ICD-10-CM | POA: Diagnosis not present

## 2018-03-23 DIAGNOSIS — R2689 Other abnormalities of gait and mobility: Secondary | ICD-10-CM | POA: Diagnosis not present

## 2018-03-25 ENCOUNTER — Encounter (HOSPITAL_COMMUNITY): Payer: Self-pay

## 2018-03-25 ENCOUNTER — Other Ambulatory Visit: Payer: Self-pay

## 2018-03-25 ENCOUNTER — Inpatient Hospital Stay (HOSPITAL_COMMUNITY)
Admission: EM | Admit: 2018-03-25 | Discharge: 2018-03-31 | DRG: 689 | Disposition: A | Discharge status: Skilled Nursing Facility | Payer: Medicare HMO | Attending: Internal Medicine | Admitting: Internal Medicine

## 2018-03-25 ENCOUNTER — Emergency Department (HOSPITAL_COMMUNITY): Payer: Medicare HMO

## 2018-03-25 DIAGNOSIS — R4020 Unspecified coma: Secondary | ICD-10-CM | POA: Diagnosis not present

## 2018-03-25 DIAGNOSIS — F0280 Dementia in other diseases classified elsewhere without behavioral disturbance: Secondary | ICD-10-CM | POA: Diagnosis not present

## 2018-03-25 DIAGNOSIS — Z7982 Long term (current) use of aspirin: Secondary | ICD-10-CM

## 2018-03-25 DIAGNOSIS — G2581 Restless legs syndrome: Secondary | ICD-10-CM | POA: Diagnosis present

## 2018-03-25 DIAGNOSIS — N390 Urinary tract infection, site not specified: Principal | ICD-10-CM | POA: Diagnosis present

## 2018-03-25 DIAGNOSIS — R404 Transient alteration of awareness: Secondary | ICD-10-CM | POA: Diagnosis not present

## 2018-03-25 DIAGNOSIS — E039 Hypothyroidism, unspecified: Secondary | ICD-10-CM | POA: Diagnosis not present

## 2018-03-25 DIAGNOSIS — B964 Proteus (mirabilis) (morganii) as the cause of diseases classified elsewhere: Secondary | ICD-10-CM | POA: Diagnosis present

## 2018-03-25 DIAGNOSIS — I10 Essential (primary) hypertension: Secondary | ICD-10-CM

## 2018-03-25 DIAGNOSIS — Z515 Encounter for palliative care: Secondary | ICD-10-CM | POA: Diagnosis not present

## 2018-03-25 DIAGNOSIS — R29898 Other symptoms and signs involving the musculoskeletal system: Secondary | ICD-10-CM | POA: Diagnosis not present

## 2018-03-25 DIAGNOSIS — I455 Other specified heart block: Secondary | ICD-10-CM | POA: Diagnosis present

## 2018-03-25 DIAGNOSIS — M5136 Other intervertebral disc degeneration, lumbar region: Secondary | ICD-10-CM | POA: Diagnosis present

## 2018-03-25 DIAGNOSIS — G9341 Metabolic encephalopathy: Secondary | ICD-10-CM | POA: Diagnosis present

## 2018-03-25 DIAGNOSIS — Z7401 Bed confinement status: Secondary | ICD-10-CM | POA: Diagnosis not present

## 2018-03-25 DIAGNOSIS — M2550 Pain in unspecified joint: Secondary | ICD-10-CM | POA: Diagnosis not present

## 2018-03-25 DIAGNOSIS — I480 Paroxysmal atrial fibrillation: Secondary | ICD-10-CM | POA: Diagnosis present

## 2018-03-25 DIAGNOSIS — Z66 Do not resuscitate: Secondary | ICD-10-CM | POA: Diagnosis not present

## 2018-03-25 DIAGNOSIS — G300 Alzheimer's disease with early onset: Secondary | ICD-10-CM | POA: Diagnosis not present

## 2018-03-25 DIAGNOSIS — Z79899 Other long term (current) drug therapy: Secondary | ICD-10-CM

## 2018-03-25 DIAGNOSIS — I4819 Other persistent atrial fibrillation: Secondary | ICD-10-CM | POA: Diagnosis present

## 2018-03-25 DIAGNOSIS — E860 Dehydration: Secondary | ICD-10-CM | POA: Diagnosis not present

## 2018-03-25 DIAGNOSIS — Z7189 Other specified counseling: Secondary | ICD-10-CM

## 2018-03-25 DIAGNOSIS — N179 Acute kidney failure, unspecified: Secondary | ICD-10-CM | POA: Diagnosis not present

## 2018-03-25 DIAGNOSIS — G309 Alzheimer's disease, unspecified: Secondary | ICD-10-CM | POA: Diagnosis present

## 2018-03-25 DIAGNOSIS — R7989 Other specified abnormal findings of blood chemistry: Secondary | ICD-10-CM

## 2018-03-25 DIAGNOSIS — R296 Repeated falls: Secondary | ICD-10-CM | POA: Diagnosis present

## 2018-03-25 DIAGNOSIS — R531 Weakness: Secondary | ICD-10-CM | POA: Diagnosis not present

## 2018-03-25 DIAGNOSIS — G20 Parkinson's disease: Secondary | ICD-10-CM | POA: Diagnosis present

## 2018-03-25 DIAGNOSIS — I959 Hypotension, unspecified: Secondary | ICD-10-CM | POA: Diagnosis not present

## 2018-03-25 DIAGNOSIS — Z885 Allergy status to narcotic agent status: Secondary | ICD-10-CM

## 2018-03-25 DIAGNOSIS — Z79890 Hormone replacement therapy (postmenopausal): Secondary | ICD-10-CM

## 2018-03-25 DIAGNOSIS — J9811 Atelectasis: Secondary | ICD-10-CM | POA: Diagnosis not present

## 2018-03-25 DIAGNOSIS — R4182 Altered mental status, unspecified: Secondary | ICD-10-CM | POA: Diagnosis not present

## 2018-03-25 DIAGNOSIS — K219 Gastro-esophageal reflux disease without esophagitis: Secondary | ICD-10-CM | POA: Diagnosis present

## 2018-03-25 DIAGNOSIS — F319 Bipolar disorder, unspecified: Secondary | ICD-10-CM | POA: Diagnosis present

## 2018-03-25 DIAGNOSIS — E785 Hyperlipidemia, unspecified: Secondary | ICD-10-CM | POA: Diagnosis not present

## 2018-03-25 DIAGNOSIS — E782 Mixed hyperlipidemia: Secondary | ICD-10-CM | POA: Diagnosis not present

## 2018-03-25 DIAGNOSIS — R4701 Aphasia: Secondary | ICD-10-CM | POA: Diagnosis present

## 2018-03-25 DIAGNOSIS — M5489 Other dorsalgia: Secondary | ICD-10-CM | POA: Diagnosis not present

## 2018-03-25 DIAGNOSIS — R410 Disorientation, unspecified: Secondary | ICD-10-CM | POA: Diagnosis not present

## 2018-03-25 DIAGNOSIS — G4489 Other headache syndrome: Secondary | ICD-10-CM | POA: Diagnosis not present

## 2018-03-25 DIAGNOSIS — R5381 Other malaise: Secondary | ICD-10-CM | POA: Diagnosis not present

## 2018-03-25 DIAGNOSIS — I491 Atrial premature depolarization: Secondary | ICD-10-CM | POA: Diagnosis not present

## 2018-03-25 DIAGNOSIS — R32 Unspecified urinary incontinence: Secondary | ICD-10-CM | POA: Diagnosis present

## 2018-03-25 HISTORY — DX: Alzheimer's disease, unspecified: G30.9

## 2018-03-25 HISTORY — DX: Pure hypercholesterolemia, unspecified: E78.00

## 2018-03-25 HISTORY — DX: Anxiety disorder, unspecified: F41.9

## 2018-03-25 HISTORY — DX: Urinary tract infection, site not specified: N39.0

## 2018-03-25 HISTORY — DX: Bipolar disorder, unspecified: F31.9

## 2018-03-25 HISTORY — DX: Parkinson's disease: G20

## 2018-03-25 HISTORY — DX: Unspecified thoracic, thoracolumbar and lumbosacral intervertebral disc disorder: M51.9

## 2018-03-25 HISTORY — DX: Insomnia, unspecified: G47.00

## 2018-03-25 HISTORY — DX: Dementia in other diseases classified elsewhere, unspecified severity, without behavioral disturbance, psychotic disturbance, mood disturbance, and anxiety: F02.80

## 2018-03-25 HISTORY — DX: Depression, unspecified: F32.A

## 2018-03-25 HISTORY — DX: Essential (primary) hypertension: I10

## 2018-03-25 HISTORY — DX: Weakness: R53.1

## 2018-03-25 HISTORY — DX: Parkinson's disease without dyskinesia, without mention of fluctuations: G20.A1

## 2018-03-25 HISTORY — DX: Disorder of thyroid, unspecified: E07.9

## 2018-03-25 HISTORY — DX: Gastro-esophageal reflux disease without esophagitis: K21.9

## 2018-03-25 HISTORY — DX: Major depressive disorder, single episode, unspecified: F32.9

## 2018-03-25 HISTORY — DX: Restless legs syndrome: G25.81

## 2018-03-25 HISTORY — DX: Unspecified dementia, unspecified severity, without behavioral disturbance, psychotic disturbance, mood disturbance, and anxiety: F03.90

## 2018-03-25 LAB — CBC WITH DIFFERENTIAL/PLATELET
Abs Immature Granulocytes: 0.06 10*3/uL (ref 0.00–0.07)
BASOS ABS: 0.1 10*3/uL (ref 0.0–0.1)
Basophils Relative: 1 %
Eosinophils Absolute: 0.1 10*3/uL (ref 0.0–0.5)
Eosinophils Relative: 1 %
HCT: 46.5 % — ABNORMAL HIGH (ref 36.0–46.0)
Hemoglobin: 14.6 g/dL (ref 12.0–15.0)
IMMATURE GRANULOCYTES: 1 %
Lymphocytes Relative: 23 %
Lymphs Abs: 2.3 10*3/uL (ref 0.7–4.0)
MCH: 27.3 pg (ref 26.0–34.0)
MCHC: 31.4 g/dL (ref 30.0–36.0)
MCV: 86.9 fL (ref 80.0–100.0)
Monocytes Absolute: 0.7 10*3/uL (ref 0.1–1.0)
Monocytes Relative: 7 %
NRBC: 0 % (ref 0.0–0.2)
Neutro Abs: 6.6 10*3/uL (ref 1.7–7.7)
Neutrophils Relative %: 67 %
Platelets: 280 10*3/uL (ref 150–400)
RBC: 5.35 MIL/uL — ABNORMAL HIGH (ref 3.87–5.11)
RDW: 13.1 % (ref 11.5–15.5)
WBC: 9.9 10*3/uL (ref 4.0–10.5)

## 2018-03-25 LAB — URINALYSIS, COMPLETE (UACMP) WITH MICROSCOPIC
Bilirubin Urine: NEGATIVE
Glucose, UA: NEGATIVE mg/dL
HGB URINE DIPSTICK: NEGATIVE
Ketones, ur: NEGATIVE mg/dL
NITRITE: NEGATIVE
Protein, ur: NEGATIVE mg/dL
Specific Gravity, Urine: 1.012 (ref 1.005–1.030)
pH: 7 (ref 5.0–8.0)

## 2018-03-25 LAB — COMPREHENSIVE METABOLIC PANEL
ALK PHOS: 103 U/L (ref 38–126)
ALT: 5 U/L (ref 0–44)
AST: 20 U/L (ref 15–41)
Albumin: 3.6 g/dL (ref 3.5–5.0)
Anion gap: 11 (ref 5–15)
BUN: 25 mg/dL — ABNORMAL HIGH (ref 8–23)
CO2: 25 mmol/L (ref 22–32)
Calcium: 9.8 mg/dL (ref 8.9–10.3)
Chloride: 100 mmol/L (ref 98–111)
Creatinine, Ser: 1.34 mg/dL — ABNORMAL HIGH (ref 0.44–1.00)
GFR calc Af Amer: 45 mL/min — ABNORMAL LOW (ref >60)
GFR calc non Af Amer: 39 mL/min — ABNORMAL LOW (ref >60)
Glucose, Bld: 116 mg/dL — ABNORMAL HIGH (ref 70–99)
Potassium: 4.4 mmol/L (ref 3.5–5.1)
SODIUM: 136 mmol/L (ref 135–145)
Total Bilirubin: 0.7 mg/dL (ref 0.3–1.2)
Total Protein: 8.6 g/dL — ABNORMAL HIGH (ref 6.5–8.1)

## 2018-03-25 LAB — I-STAT TROPONIN, ED: Troponin i, poc: 0 ng/mL (ref 0.00–0.08)

## 2018-03-25 LAB — I-STAT CG4 LACTIC ACID, ED: Lactic Acid, Venous: 1.31 mmol/L (ref 0.5–1.9)

## 2018-03-25 LAB — MAGNESIUM: Magnesium: 2.4 mg/dL (ref 1.7–2.4)

## 2018-03-25 MED ORDER — SODIUM CHLORIDE 0.9 % IV BOLUS
500.0000 mL | Freq: Once | INTRAVENOUS | Status: AC
  Administered 2018-03-25: 500 mL via INTRAVENOUS

## 2018-03-25 NOTE — ED Notes (Signed)
Got patient on the monitor did ekg shown to Dr Criss Alvine patient is resting with nurse at bedfside

## 2018-03-25 NOTE — ED Triage Notes (Addendum)
Pt arrives to [facility] via [facility] with 'stroke s/s". From [facility], RN this morning is not patient's normal daily RN, and unsure what her "normal" is. However, EMS states RN reportedly woke patient up this morning for dental appt.  States she was a full assist to get in wheelchair which she knew was not her normal, as she has seen her walking in the halls with walker before.  Dentist on site reports pt told him "I think I'm having a stroke". Dentist called on site NP who knows pt well and noted pots speech was very slurred compared to her baseline, and that she seemed confused. No one at the time at the facility was able to report a "last seen normal" for pt since Friday. Hx recent UTI Dx 5 days ago. Tresa Endo PA in room to evaluate patient. Pt DNR per facility paperwork.

## 2018-03-25 NOTE — ED Notes (Signed)
Placed a external cath patient is now gone to xray

## 2018-03-25 NOTE — ED Provider Notes (Signed)
[facility] EMERGENCY DEPARTMENT Provider Note   CSN: [id number] Arrival date & time: 03/25/18  1213     History   Chief Complaint Chief Complaint  Patient presents with  . Aphasia  . Altered Mental Status  . Weakness    HPI Jessica Hayes is a 73 y.o. female who presents with AMS and possible stroke symptoms. LSN is unknown. She resides at a SNF. PMH significant for Parkinson's disease, dementia, GERD, HTN, generalized weakness, lumbar DDD. She was recently diagnosed and treated for a UTI. Nursing staff this morning called EMS due to pt not being able to walk. She was a full assist and normally can walk with a walker. She was scheduled to have a dentist come fit her for dentures. The patient told the dentist she thought she was having a stroke. The nursing staff thought the patient's speech was slurred and she was confused but the nurse there is not her usual nurse. The patient reports a dull headache and some low back pain. She has had frequent falls.  The patient denies chest pain, SOB, abdominal pain. She is DNR.  LEVEL 5 CAVEAT due to dementia  HPI  Past Medical History:  Diagnosis Date  . Alzheimer disease (HCC)   . Anxiety   . Bipolar disorder, unspecified (HCC)   . Dementia (HCC)   . Depressive disorder   . GERD (gastroesophageal reflux disease)   . Hypercholesteremia   . Hypertension   . Insomnia   . Lumbar disc disease    degenerative  . Parkinson disease (HCC)   . Restless leg syndrome   . Thyroid disease    hypothyroid  . UTI (urinary tract infection)   . Weakness     There are no active problems to display for this patient.    The histories are not reviewed yet. Please review them in the "History" navigator section and refresh this SmartLink.   OB History   No obstetric history on file.      Home Medications    Prior to Admission medications   Not on File    Family History History reviewed. No pertinent family  history.  Social History Social History   Tobacco Use  . Smoking status: Unknown If Ever Smoked  . Smokeless tobacco: Never Used  Substance Use Topics  . Alcohol use: Not Currently  . Drug use: Not Currently     Allergies   Patient has no allergy information on record.   Review of Systems Review of Systems  Unable to perform ROS: Mental status change     Physical Exam Updated Vital Signs BP (!) 136/93 (BP Location: Right Arm)   Pulse 98   Temp (!) 97.5 F (36.4 C) (Oral)   Resp 14   Ht 5\' 4"  (1.626 m)   Wt 90.7 kg   SpO2 100%   BMI 34.33 kg/m   Physical Exam Vitals signs and nursing note reviewed.  Constitutional:      General: She is not in acute distress.    Appearance: Normal appearance. She is well-developed.     Comments: Chronically ill appearing female. Mildly confused but able to converse. She tells me her name and that she thinks she's in [place]. She has dysarthria but doesn't have any teeth. Mild baseline tremor noted.  HENT:     Head: Normocephalic and atraumatic.     Mouth/Throat:     Comments: Edentulous Eyes:     General: No scleral icterus.  Right eye: No discharge.        Left eye: No discharge.     Conjunctiva/sclera: Conjunctivae normal.     Pupils: Pupils are equal, round, and reactive to light.  Neck:     Musculoskeletal: Normal range of motion.  Cardiovascular:     Rate and Rhythm: Normal rate and regular rhythm.  Pulmonary:     Effort: Pulmonary effort is normal. No respiratory distress.     Breath sounds: Normal breath sounds.  Abdominal:     General: Abdomen is flat. There is no distension.     Palpations: Abdomen is soft.     Tenderness: There is no abdominal tenderness.     Comments: Prior surgical scar  Skin:    General: Skin is warm and dry.  Neurological:     Mental Status: She is alert and oriented to person, place, and time.     Comments: Lying on stretcher in NAD. GCS 15. Dysarthric. Cranial nerves II  through XII grossly intact. 5/5 strength in all extremities. Sensation fully intact.  Bilateral finger-to-nose intact. Gait not tested  Psychiatric:        Mood and Affect: Mood normal.        Behavior: Behavior normal.      ED Treatments / Results  Labs (all labs ordered are listed, but only abnormal results are displayed) Labs Reviewed  COMPREHENSIVE METABOLIC PANEL - Abnormal; Notable for the following components:      Result Value   Glucose, Bld 116 (*)    BUN 25 (*)    Creatinine, Ser 1.34 (*)    Total Protein 8.6 (*)    GFR calc non Af Amer 39 (*)    GFR calc Af Amer 45 (*)    All other components within normal limits  CBC WITH DIFFERENTIAL/PLATELET - Abnormal; Notable for the following components:   RBC 5.35 (*)    HCT 46.5 (*)    All other components within normal limits  URINALYSIS, COMPLETE (UACMP) WITH MICROSCOPIC - Abnormal; Notable for the following components:   Leukocytes, UA LARGE (*)    Bacteria, UA RARE (*)    All other components within normal limits  URINE CULTURE  MAGNESIUM  I-STAT CG4 LACTIC ACID, ED  I-STAT TROPONIN, ED    EKG EKG Interpretation  Date/Time:  Monday March 25 2018 12:22:33 EST Ventricular Rate:  79 PR Interval:    QRS Duration: 90 QT Interval:  378 QTC Calculation: 434 R Axis:   -13 Text Interpretation:  Sinus rhythm Atrial premature complex Left ventricular hypertrophy Borderline T abnormalities, anterior leads No old tracing to compare Confirmed by Pricilla LovelessGoldston, Scott [phone]([id number]) on 03/25/2018 3:44:32 PM   Radiology Dg Chest 2 View  Result Date: 03/25/2018 CLINICAL DATA:  Altered mental status EXAM: CHEST - 2 VIEW COMPARISON:  None. FINDINGS: Heart size and vascularity normal. Mild bibasilar atelectasis. Negative for effusion or mass. No acute skeletal abnormality. IMPRESSION: Mild bibasilar atelectasis. Electronically Signed   By: Marlan Palauharles  Clark M.D.   On: 03/25/2018 13:42   Ct Head Wo Contrast  Result Date: 03/25/2018 CLINICAL  DATA:  Altered level of consciousness. EXAM: CT HEAD WITHOUT CONTRAST TECHNIQUE: Contiguous axial images were obtained from the base of the skull through the vertex without intravenous contrast. COMPARISON:  None. FINDINGS: Brain: No evidence of acute infarction, hemorrhage, hydrocephalus, extra-axial collection or mass lesion/mass effect. There is mild diffuse low-attenuation within the subcortical and periventricular white matter compatible with chronic microvascular disease. Vascular: No hyperdense vessel or  unexpected calcification. Skull: Normal. Negative for fracture or focal lesion. Sinuses/Orbits: No acute finding. Other: None IMPRESSION: 1. No acute intracranial abnormalities. 2. Chronic small vessel ischemic change. Electronically Signed   By: Signa Kell M.D.   On: 03/25/2018 14:42    Procedures Procedures (including critical care time)  Medications Ordered in ED Medications  sodium chloride 0.9 % bolus 500 mL (0 mLs Intravenous Stopped 03/25/18 1857)     Initial Impression / Assessment and Plan / ED Course  I have reviewed the triage vital signs and the nursing notes.  Pertinent labs & imaging results that were available during my care of the patient were reviewed by me and considered in my medical decision making (see chart for details).  73 year old female presents with AMS/weakness. Started on Bactrim for UTI at facility. BP has been transiently elevated here. But otherwise vitals are normal. The patient is an unreliable historian and cannot give me any details of why she is here. Unclear of her baseline. Will attempt to contact facility and her daughter. EKG is SR with PACs. Will obtain labs, UA, CT head, Xray.  4:25 PM  Attempted to contact daughter twice with no answer. Attempted to call SNF with no answer. CBC shows mild elevation of HCT. CMP shows mild elevation of BUN/Scr (25/1.34). I-stat trop is 0. Lactic acid and rectal temp are normal. Mag was normal. CXR is negative.  While in CT, she had an episode of abnormal rhythm which was captured on monitor. This was reviewed by Dr. Criss Alvine who thinks it is most likely artifact from her tremors. UA shows large leukocytes and 6-10 WBC with rare bacteria. Will send culture.   8:06 PM Still attempting to contact daughter or facility to determine baseline and I am unable to do this. Work up thus far has been unrevealing. Will admit for AMS/gait abnormality. Discussed with Dr. Loney Loh who will admit.   Final Clinical Impressions(s) / ED Diagnoses   Final diagnoses:  Altered mental status, unspecified altered mental status type  Weakness of both lower extremities  Elevated serum creatinine    ED Discharge Orders    None       Bethel Born, PA-C 03/25/18 2226    Pricilla Loveless, MD 03/26/18 [phone]

## 2018-03-25 NOTE — ED Notes (Signed)
Attempted to ambulate patient with EMT.  Patient would not bear weight to legs bilaterally and began to slump/slide out of the bed with bent knees and feet sliding out.  EMT and this RN picked up patient before fall and placed her safely back in bed. No injuries to patient or employees. PA notified of findings.

## 2018-03-26 ENCOUNTER — Observation Stay (HOSPITAL_COMMUNITY): Payer: Medicare HMO

## 2018-03-26 ENCOUNTER — Ambulatory Visit: Payer: Medicare HMO | Admitting: Neurology

## 2018-03-26 ENCOUNTER — Telehealth: Payer: Self-pay | Admitting: Neurology

## 2018-03-26 DIAGNOSIS — E860 Dehydration: Secondary | ICD-10-CM

## 2018-03-26 DIAGNOSIS — G309 Alzheimer's disease, unspecified: Secondary | ICD-10-CM

## 2018-03-26 DIAGNOSIS — E039 Hypothyroidism, unspecified: Secondary | ICD-10-CM

## 2018-03-26 DIAGNOSIS — I10 Essential (primary) hypertension: Secondary | ICD-10-CM

## 2018-03-26 DIAGNOSIS — R4182 Altered mental status, unspecified: Secondary | ICD-10-CM | POA: Diagnosis not present

## 2018-03-26 DIAGNOSIS — E785 Hyperlipidemia, unspecified: Secondary | ICD-10-CM

## 2018-03-26 DIAGNOSIS — F0280 Dementia in other diseases classified elsewhere without behavioral disturbance: Secondary | ICD-10-CM

## 2018-03-26 LAB — AMMONIA: Ammonia: 17 umol/L (ref 9–35)

## 2018-03-26 LAB — BASIC METABOLIC PANEL
Anion gap: 11 (ref 5–15)
BUN: 27 mg/dL — ABNORMAL HIGH (ref 8–23)
CO2: 21 mmol/L — ABNORMAL LOW (ref 22–32)
Calcium: 9.3 mg/dL (ref 8.9–10.3)
Chloride: 105 mmol/L (ref 98–111)
Creatinine, Ser: 1.34 mg/dL — ABNORMAL HIGH (ref 0.44–1.00)
GFR calc non Af Amer: 39 mL/min — ABNORMAL LOW (ref >60)
GFR, EST AFRICAN AMERICAN: 45 mL/min — AB (ref >60)
Glucose, Bld: 141 mg/dL — ABNORMAL HIGH (ref 70–99)
POTASSIUM: 4.2 mmol/L (ref 3.5–5.1)
Sodium: 137 mmol/L (ref 135–145)

## 2018-03-26 LAB — TSH: TSH: 0.986 u[IU]/mL (ref 0.350–4.500)

## 2018-03-26 LAB — MRSA PCR SCREENING: MRSA by PCR: NEGATIVE

## 2018-03-26 LAB — VITAMIN B12: Vitamin B-12: 566 pg/mL (ref 180–914)

## 2018-03-26 MED ORDER — HYDRALAZINE HCL 20 MG/ML IJ SOLN
20.0000 mg | Freq: Once | INTRAMUSCULAR | Status: DC
  Filled 2018-03-26 (×3): qty 1

## 2018-03-26 MED ORDER — ACETAMINOPHEN 325 MG PO TABS
650.0000 mg | ORAL_TABLET | Freq: Four times a day (QID) | ORAL | Status: DC | PRN
  Administered 2018-03-29 (×3): 650 mg via ORAL
  Filled 2018-03-26 (×3): qty 2

## 2018-03-26 MED ORDER — SODIUM CHLORIDE 0.9 % IV SOLN
INTRAVENOUS | Status: AC
  Administered 2018-03-26: 17:00:00 via INTRAVENOUS

## 2018-03-26 MED ORDER — ENOXAPARIN SODIUM 40 MG/0.4ML ~~LOC~~ SOLN
40.0000 mg | Freq: Every day | SUBCUTANEOUS | Status: DC
  Administered 2018-03-26 – 2018-03-31 (×6): 40 mg via SUBCUTANEOUS
  Filled 2018-03-26 (×7): qty 0.4

## 2018-03-26 MED ORDER — ACETAMINOPHEN 650 MG RE SUPP: 650.0000 mg | Freq: Four times a day (QID) | RECTAL | Status: DC | PRN

## 2018-03-26 MED ORDER — SODIUM CHLORIDE 0.9 % IV SOLN
1.0000 g | Freq: Every day | INTRAVENOUS | Status: DC
  Administered 2018-03-26 – 2018-03-27 (×3): 1 g via INTRAVENOUS
  Filled 2018-03-26 (×3): qty 10

## 2018-03-26 MED ORDER — METOPROLOL TARTRATE 5 MG/5ML IV SOLN
5.0000 mg | Freq: Four times a day (QID) | INTRAVENOUS | Status: DC
  Administered 2018-03-26 – 2018-03-28 (×7): 5 mg via INTRAVENOUS
  Filled 2018-03-26 (×8): qty 5

## 2018-03-26 MED ORDER — SODIUM CHLORIDE 0.9 % IV SOLN
INTRAVENOUS | Status: AC
  Administered 2018-03-26: 03:00:00 via INTRAVENOUS

## 2018-03-26 MED ORDER — HYDRALAZINE HCL 20 MG/ML IJ SOLN: 5.0000 mg | INTRAMUSCULAR | Status: DC | PRN

## 2018-03-26 NOTE — Telephone Encounter (Signed)
This patient did not show for a new patient appointment today, she was admitted to the hospital yesterday, she is in the hospital currently.

## 2018-03-26 NOTE — H&P (Addendum)
History and Physical    Jessica HumphreyCarolyn A Hayes WUJ:[id number] DOB: 1945/10/04 DOA: 03/25/2018  PCP: [name], Stephen D., MD Patient coming from: Nursing home  Chief Complaint: Sent to the ED for evaluation of strokelike symptoms.  HPI: Jessica Hayes is a 73 y.o. female with medical history significant of Alzheimer's dementia, Parkinson's disease, anxiety, bipolar disorder, hypertension, hyperlipidemia, hypothyroidism being sent to the hospital from her nursing home for evaluation of stroke-like symptoms.  Per triage note, patient's RN this morning was not her normal RN and was unsure of her baseline.  Per EMS, RN reported that patient had weakness this morning and required full assistance to get in her wheelchair.  Patient has previously been seen walking in the hallways with a walker.  Patient went to her dentist appointment and it was noted that her speech was slurred compared to her baseline and she appeared confused.  No one at the time at the facility was able to report" last seen normal" for the patient since Friday.  Patient was recently diagnosed with a UTI and started on Bactrim at the facility.  ED provider unable to reach family despite multiple attempts.  Patient is currently confused and oriented to self only.  Reports having dysuria.  No additional history could be obtained from her.  Review of Systems: As per HPI otherwise 10 point review of systems negative.  Past Medical History:  Diagnosis Date  . Alzheimer disease (HCC)   . Anxiety   . Bipolar disorder, unspecified (HCC)   . Dementia (HCC)   . Depressive disorder   . GERD (gastroesophageal reflux disease)   . Hypercholesteremia   . Hypertension   . Insomnia   . Lumbar disc disease    degenerative  . Parkinson disease (HCC)   . Restless leg syndrome   . Thyroid disease    hypothyroid  . UTI (urinary tract infection)   . Weakness     History reviewed. No pertinent surgical history.   has an unknown smoking status.  She has never used smokeless tobacco. She reports previous alcohol use. She reports previous drug use.  Not on File  History reviewed. No pertinent family history.  Prior to Admission medications   Not on File    Physical Exam: Vitals:   03/25/18 1730 03/25/18 2145 03/25/18 2300 03/25/18 2345  BP: (!) 119/97 105/80 131/74 128/63  Pulse: 90 99 99 (!) 104  Resp: 13 17 19 17   Temp:      TempSrc:      SpO2: 98% 97% 95% 97%  Weight:      Height:        Physical Exam  Constitutional: No distress.  HENT:  Head: Normocephalic.  Dry mucous membranes  Eyes:  Unable to assess pupillary reaction to light due to lack of patient cooperation.  Neck: Neck supple.  Cardiovascular: Normal rate, regular rhythm and intact distal pulses.  Pulmonary/Chest: Effort normal. No respiratory distress.  Anterior lung fields clear to auscultation  Abdominal: Soft. Bowel sounds are normal. She exhibits no distension. There is no abdominal tenderness. There is no guarding.  Musculoskeletal:        General: No edema.  Neurological:  Awake and alert Oriented to self only Appears confused Speech difficult to understand No facial droop Tongue midline Strength 4 out of 5 and symmetric in bilateral upper extremities.  Pill-rolling tremor of bilateral hands noted. Strength 5 out of 5 and symmetric in bilateral lower extremities.  Skin: Skin is warm and dry. She  is not diaphoretic.     Labs on Admission: I have personally reviewed following labs and imaging studies  CBC: Recent Labs  Lab 03/25/18 1235  WBC 9.9  NEUTROABS 6.6  HGB 14.6  HCT 46.5*  MCV 86.9  PLT 280   Basic Metabolic Panel: Recent Labs  Lab 03/25/18 1235 03/25/18 1700  NA 136  --   K 4.4  --   CL 100  --   CO2 25  --   GLUCOSE 116*  --   BUN 25*  --   CREATININE 1.34*  --   CALCIUM 9.8  --   MG  --  2.4   GFR: Estimated Creatinine Clearance: 40.8 mL/min (A) (by C-G formula based on SCr of 1.34 mg/dL (H)). Liver  Function Tests: Recent Labs  Lab 03/25/18 1235  AST 20  ALT 5  ALKPHOS 103  BILITOT 0.7  PROT 8.6*  ALBUMIN 3.6   No results for input(s): LIPASE, AMYLASE in the last 168 hours. No results for input(s): AMMONIA in the last 168 hours. Coagulation Profile: No results for input(s): INR, PROTIME in the last 168 hours. Cardiac Enzymes: No results for input(s): CKTOTAL, CKMB, CKMBINDEX, TROPONINI in the last 168 hours. BNP (last 3 results) No results for input(s): PROBNP in the last 8760 hours. HbA1C: No results for input(s): HGBA1C in the last 72 hours. CBG: No results for input(s): GLUCAP in the last 168 hours. Lipid Profile: No results for input(s): CHOL, HDL, LDLCALC, TRIG, CHOLHDL, LDLDIRECT in the last 72 hours. Thyroid Function Tests: No results for input(s): TSH, T4TOTAL, FREET4, T3FREE, THYROIDAB in the last 72 hours. Anemia Panel: No results for input(s): VITAMINB12, FOLATE, FERRITIN, TIBC, IRON, RETICCTPCT in the last 72 hours. Urine analysis:    Component Value Date/Time   COLORURINE YELLOW 03/25/2018 1700   APPEARANCEUR CLEAR 03/25/2018 1700   LABSPEC 1.012 03/25/2018 1700   PHURINE 7.0 03/25/2018 1700   GLUCOSEU NEGATIVE 03/25/2018 1700   HGBUR NEGATIVE 03/25/2018 1700   BILIRUBINUR NEGATIVE 03/25/2018 1700   KETONESUR NEGATIVE 03/25/2018 1700   PROTEINUR NEGATIVE 03/25/2018 1700   NITRITE NEGATIVE 03/25/2018 1700   LEUKOCYTESUR LARGE (A) 03/25/2018 1700    Radiological Exams on Admission: Dg Chest 2 View  Result Date: 03/25/2018 CLINICAL DATA:  Altered mental status EXAM: CHEST - 2 VIEW COMPARISON:  None. FINDINGS: Heart size and vascularity normal. Mild bibasilar atelectasis. Negative for effusion or mass. No acute skeletal abnormality. IMPRESSION: Mild bibasilar atelectasis. Electronically Signed   By: Marlan Palauharles  Clark M.D.   On: 03/25/2018 13:42   Ct Head Wo Contrast  Result Date: 03/25/2018 CLINICAL DATA:  Altered level of consciousness. EXAM: CT HEAD  WITHOUT CONTRAST TECHNIQUE: Contiguous axial images were obtained from the base of the skull through the vertex without intravenous contrast. COMPARISON:  None. FINDINGS: Brain: No evidence of acute infarction, hemorrhage, hydrocephalus, extra-axial collection or mass lesion/mass effect. There is mild diffuse low-attenuation within the subcortical and periventricular white matter compatible with chronic microvascular disease. Vascular: No hyperdense vessel or unexpected calcification. Skull: Normal. Negative for fracture or focal lesion. Sinuses/Orbits: No acute finding. Other: None IMPRESSION: 1. No acute intracranial abnormalities. 2. Chronic small vessel ischemic change. Electronically Signed   By: Signa Kellaylor  Stroud M.D.   On: 03/25/2018 14:42    EKG: Independently reviewed.  Sinus rhythm, PACs, artifact.  No prior EKG for comparison.  Assessment/Plan Principal Problem:   AMS (altered mental status) Active Problems:   Dehydration   Alzheimer's dementia (HCC)   Essential  hypertension   Hyperlipidemia   Hypothyroidism   Aphasia, altered mental status, generalized weakness Unclear what the patient's baseline is based on report from nursing home.  No family available at this time. Patient is afebrile and does not have leukocytosis.  Lactic acid normal.  Recently treated with Bactrim for a UTI at her facility.  UA showing large amount of leukocytes, 6-10 WBCs/ HPF, large amount of leukocytes, and negative nitrite.  She does report having dysuria.  Chest x-ray not suggestive of pneumonia.  Head CT negative for acute finding.  Neuro exam nonfocal.  However, her speech is difficult to understand.  Per report, her dentist's office also thought her speech was slurred compared to her baseline. -Will start ceftriaxone  -Urine culture pending -Brain MRI to rule out acute CVA -Check B12, TSH, NH3 levels -PT evaluation  Dehydration/ ?AKI BUN 25.  Serum creatinine 1.34.  No prior labs in the chart.  Unclear  what her baseline renal function is.  Appears dry on exam. -IV fluid hydration -Repeat BMP in a.m.  Alzheimer's dementia -No home medications listed in the chart.  No pharmacy med rec done.  Hypertension Currently normotensive. -Brain MRI pending at this time. If MRI is negative for stroke, give antihypertensive medication. -No home medications listed in the chart.  No pharmacy med rec done.  Hyperlipidemia -No home medications listed in the chart.  No pharmacy med rec done.  Hypothyroidism -No home medications listed in the chart.  No pharmacy med rec done. -Check TSH  DVT prophylaxis: Lovenox Code Status:  DNR per facility paperwork. Family Communication: No family available. Disposition Plan: Anticipate discharge after clinical improvement. Consults called: None Admission status: Observation  John Giovanni MD [facility] Pager [phone]  If 7PM-7AM, please contact night-coverage [email or web address] Password [email or web address]  03/26/2018, 1:31 AM

## 2018-03-26 NOTE — Progress Notes (Addendum)
CSW acknowledging consult for patient to return to SNF. Patient currently disoriented, unable to confirm placement with the patient. CSW attempted to contact patient's daughter; had to look up number for the daughter as the number listed doesn't work. CSW updated patient's daughter's number, left a voicemail for patient's daughter. CSW contacted by [facility], and they are able to take patient back when stable. Per SNF, patient has been with them since March of last year, and they have only seen the daughter a couple of times.   CSW also contacted by RN that pharmacy has attempted to reach out to [facility] to get the patient's medication list. CSW reached out to Admissions to ask them to obtain the medications and fax them to the unit.  CSW to follow.  Blenda Nicely, [place] Clinical Social Worker [phone]

## 2018-03-26 NOTE — ED Notes (Signed)
  Pt transported to ct 

## 2018-03-26 NOTE — ED Notes (Signed)
Dr. Loney Loh made aware of pt's blood pressure, no new orders recieved

## 2018-03-26 NOTE — Care Management Obs Status (Signed)
MEDICARE OBSERVATION STATUS NOTIFICATION   Patient Details  Name: Jessica Hayes MRN: [id number] Date of Birth: 27-Apr-1945   Medicare Observation Status Notification Given:  Yes    Kermit Balo, RN 03/26/2018, 2:30 PM

## 2018-03-26 NOTE — Progress Notes (Signed)
Progress Note    Jessica HumphreyCarolyn A Hayes  WUJ:[id number] DOB: [birth year]  DOA: 03/25/2018 PCP: [name], Stephen D., MD    Brief Narrative:     Medical records reviewed and are as summarized below:  Jessica Humphreyarolyn A Hayes is an 73 y.o. female with medical history significant of Alzheimer's dementia, Parkinson's disease, anxiety, bipolar disorder, hypertension, hyperlipidemia, hypothyroidism being sent to the hospital from her nursing home for evaluation of stroke-like symptoms.  Per triage note, patient's RN this morning was not her normal RN and was unsure of her baseline.  Per EMS, RN reported that patient had weakness this morning and required full assistance to get in her wheelchair.  Patient has previously been seen walking in the hallways with a walker.  Patient went to her dentist appointment and it was noted that her speech was slurred compared to her baseline and she appeared confused.    Assessment/Plan:   Principal Problem:   AMS (altered mental status) Active Problems:   Dehydration   Alzheimer's dementia (HCC)   Essential hypertension   Hyperlipidemia   Hypothyroidism  Possible Aphasia, altered mental status, generalized weakness -it is unclear of what the patient's mental baseline is  - No family available at this time.  - Patient is afebrile and does not have leukocytosis - Lactic acid normal: Recently treated with Bactrim for a UTI at her facility.  UA showing large amount of leukocytes, 6-10 WBCs/ HPF, large amount of leukocytes, and negative nitrite.  She does report having dysuria.  Chest x-ray not suggestive of pneumonia.  Head CT negative for acute finding.  Neuro exam nonfocal -ceftriaxone while Urine culture pending -Brain MRI: motion artifact -PT evaluation: max assist -B12/ammonia and TSH normal  Dehydration/ ?AKI BUN 25.  Serum creatinine 1.34.  No prior labs in the chart.  Unclear what her baseline renal function is -mouth is dry -IV fluid hydration -trend BMP  and follow urine output  Alzheimer's dementia -No home medications listed in the chart.  No pharmacy med rec done.  Hypertension -await home meds recs -PRN IV medications for now  Hyperlipidemia -await home meds  Hypothyroidism -TSH normal   Family Communication/Anticipated D/C date and plan/Code Status   DVT prophylaxis: Lovenox ordered. Code Status: DNR Family Communication: none available Disposition Plan: from SNF, back to SNF once work up done   Medical Consultants:       Subjective:   Mouth very dry  Objective:    Vitals:   03/26/18 0542 03/26/18 0551 03/26/18 0640 03/26/18 0747  BP:  (!) 195/171 109/77 (!) 125/92  Pulse:  (!) 102 (!) 102 98  Resp: (!) 21 14 16 18   Temp:  98.1 F (36.7 C) 98.1 F (36.7 C) 97.8 F (36.6 C)  TempSrc:  Axillary Axillary Axillary  SpO2: 97% 97% 97% 96%  Weight:  77.3 kg    Height:  5\' 4"  (1.626 m)      Intake/Output Summary (Last 24 hours) at 03/26/2018 1207 Last data filed at 03/26/2018 [id number] Gross per 24 hour  Intake 1100 ml  Output -  Net 1100 ml   Filed Weights   03/25/18 1229 03/26/18 0551  Weight: 90.7 kg 77.3 kg    Exam: In bed, dry mucous membranes rrr No increased work of breathing, anterior lungs fields clear Alert but not always appropriate Moves all 4 ext  Data Reviewed:   I have personally reviewed following labs and imaging studies:  Labs: Labs show the following:   Basic Metabolic Panel:  Recent Labs  Lab 03/25/18 1235 03/25/18 1700 03/26/18 0356  NA 136  --  137  K 4.4  --  4.2  CL 100  --  105  CO2 25  --  21*  GLUCOSE 116*  --  141*  BUN 25*  --  27*  CREATININE 1.34*  --  1.34*  CALCIUM 9.8  --  9.3  MG  --  2.4  --    GFR Estimated Creatinine Clearance: 37.6 mL/min (A) (by C-G formula based on SCr of 1.34 mg/dL (H)). Liver Function Tests: Recent Labs  Lab 03/25/18 1235  AST 20  ALT 5  ALKPHOS 103  BILITOT 0.7  PROT 8.6*  ALBUMIN 3.6   No results for  input(s): LIPASE, AMYLASE in the last 168 hours. Recent Labs  Lab 03/26/18 0356  AMMONIA 17   Coagulation profile No results for input(s): INR, PROTIME in the last 168 hours.  CBC: Recent Labs  Lab 03/25/18 1235  WBC 9.9  NEUTROABS 6.6  HGB 14.6  HCT 46.5*  MCV 86.9  PLT 280   Cardiac Enzymes: No results for input(s): CKTOTAL, CKMB, CKMBINDEX, TROPONINI in the last 168 hours. BNP (last 3 results) No results for input(s): PROBNP in the last 8760 hours. CBG: No results for input(s): GLUCAP in the last 168 hours. D-Dimer: No results for input(s): DDIMER in the last 72 hours. Hgb A1c: No results for input(s): HGBA1C in the last 72 hours. Lipid Profile: No results for input(s): CHOL, HDL, LDLCALC, TRIG, CHOLHDL, LDLDIRECT in the last 72 hours. Thyroid function studies: Recent Labs    03/26/18 0356  TSH 0.986   Anemia work up: Recent Labs    03/26/18 0356  VITAMINB12 566   Sepsis Labs: Recent Labs  Lab 03/25/18 1235 03/25/18 1441  WBC 9.9  --   LATICACIDVEN  --  1.31    Microbiology Recent Results (from the past 240 hour(s))  MRSA PCR Screening     Status: None   Collection Time: 03/26/18  5:38 AM  Result Value Ref Range Status   MRSA by PCR NEGATIVE NEGATIVE Final    Comment:        The GeneXpert MRSA Assay (FDA approved for NASAL specimens only), is one component of a comprehensive MRSA colonization surveillance program. It is not intended to diagnose MRSA infection nor to guide or monitor treatment for MRSA infections. Performed at [facility], [address], [place], [place] [address]     Procedures and diagnostic studies:  Dg Chest 2 View  Result Date: 03/25/2018 CLINICAL DATA:  Altered mental status EXAM: CHEST - 2 VIEW COMPARISON:  None. FINDINGS: Heart size and vascularity normal. Mild bibasilar atelectasis. Negative for effusion or mass. No acute skeletal abnormality. IMPRESSION: Mild bibasilar atelectasis. Electronically Signed    By: Marlan Palau M.D.   On: 03/25/2018 13:42   Ct Head Wo Contrast  Result Date: 03/25/2018 CLINICAL DATA:  Altered level of consciousness. EXAM: CT HEAD WITHOUT CONTRAST TECHNIQUE: Contiguous axial images were obtained from the base of the skull through the vertex without intravenous contrast. COMPARISON:  None. FINDINGS: Brain: No evidence of acute infarction, hemorrhage, hydrocephalus, extra-axial collection or mass lesion/mass effect. There is mild diffuse low-attenuation within the subcortical and periventricular white matter compatible with chronic microvascular disease. Vascular: No hyperdense vessel or unexpected calcification. Skull: Normal. Negative for fracture or focal lesion. Sinuses/Orbits: No acute finding. Other: None IMPRESSION: 1. No acute intracranial abnormalities. 2. Chronic small vessel ischemic change. Electronically  Signed   By: Signa Kell M.D.   On: 03/25/2018 14:42   Mr Brain Wo Contrast  Result Date: 03/26/2018 CLINICAL DATA:  Focal neuro deficit.  Altered mental status. EXAM: MRI HEAD WITHOUT CONTRAST TECHNIQUE: Multiplanar, multiecho pulse sequences of the brain and surrounding structures were obtained without intravenous contrast. COMPARISON:  Head CT from yesterday FINDINGS: Brain: No acute infarction, hemorrhage, hydrocephalus, extra-axial collection or mass lesion. History of dementia but no notable atrophy. Minor periventricular chronic small vessel ischemia. Vascular: Major flow voids are preserved Skull and upper cervical spine: Negative for marrow lesion. Sinuses/Orbits: Negative Other: Significant and progressive motion degradation. IMPRESSION: Significantly motion degraded study that is otherwise unremarkable for age. Electronically Signed   By: Marnee Spring M.D.   On: 03/26/2018 05:00    Medications:   . enoxaparin (LOVENOX) injection  40 mg Subcutaneous Daily  . hydrALAZINE  20 mg Intravenous Once   Continuous Infusions: . sodium chloride 75 mL/hr at  03/26/18 0917  . cefTRIAXone (ROCEPHIN)  IV Stopped (03/26/18 7793)     LOS: 0 days   Joseph Art  [facility]   *Please refer to [email or web address], password [email or web address] to get updated schedule on who will round on this patient, as hospitalists switch teams weekly. If 7PM-7AM, please contact night-coverage at [email or web address], password [email or web address] for any overnight needs.  03/26/2018, 12:07 PM

## 2018-03-26 NOTE — ED Notes (Signed)
Report given to 3w rn. All questions answered

## 2018-03-26 NOTE — Evaluation (Signed)
Physical Therapy Evaluation Patient Details Name: Jessica Hayes MRN: [id number] DOB: 02-28-1946 Today's Date: 03/26/2018   History of Present Illness  Jessica Hayes is a 73 y.o. female with medical history significant of Alzheimer's dementia, Parkinson's disease, anxiety, bipolar disorder, hypertension, hyperlipidemia, hypothyroidism being sent to the hospital from her nursing home for evaluation of stroke-like symptoms with some slurred speech and inability to walk per staff at facility  Clinical Impression  Patient presents with decreased mobility due to poor balance, decreased safety awareness, decreased strength and limited activity tolerance with HR going up to 125.  Feel she will benefit from skilled PT in the acute setting to allow return to SNF with increased level of assist needed.     Follow Up Recommendations SNF    Equipment Recommendations  None recommended by PT    Recommendations for Other Services       Precautions / Restrictions Precautions Precautions: Fall Restrictions Weight Bearing Restrictions: No      Mobility  Bed Mobility Overal bed mobility: Needs Assistance Bed Mobility: Rolling;Supine to Sit;Sit to Supine Rolling: Mod assist   Supine to sit: HOB elevated;Max assist Sit to supine: Max assist   General bed mobility comments: assist for legs off bed and to lift trunk to sit, assist for legs into bed and for positioning once supine  Transfers Overall transfer level: Needs assistance   Transfers: Sit to/from Stand Sit to Stand: Max assist         General transfer comment: attempted x 1 with max A and pt pushing posteriorly and sliding feet forward so did not reattempt as would need +2 A for safety  Ambulation/Gait                Stairs            Wheelchair Mobility    Modified Rankin (Stroke Patients Only) Modified Rankin (Stroke Patients Only) Pre-Morbid Rankin Score: Moderately severe disability Modified Rankin: Severe  disability     Balance Overall balance assessment: Needs assistance Sitting-balance support: Feet supported Sitting balance-Leahy Scale: Poor Sitting balance - Comments: leans posteriorly sitting EOB                                     Pertinent Vitals/Pain Pain Assessment: No/denies pain    Home Living Family/patient expects to be discharged to:: Skilled nursing facility                      Prior Function Level of Independence: Needs assistance   Gait / Transfers Assistance Needed: no family or staff present to comment on baseline, though per chart was able to ambulate PTA           Hand Dominance        Extremity/Trunk Assessment   Upper Extremity Assessment Upper Extremity Assessment: Generalized weakness    Lower Extremity Assessment Lower Extremity Assessment: Generalized weakness    Cervical / Trunk Assessment Cervical / Trunk Assessment: Kyphotic  Communication   Communication: No difficulties  Cognition Arousal/Alertness: Awake/alert Behavior During Therapy: Restless Overall Cognitive Status: No family/caregiver present to determine baseline cognitive functioning                                 General Comments: history of dementia, able to follow some one step commands to roll in bed  and to lift leg, etc      General Comments      Exercises     Assessment/Plan    PT Assessment Patient needs continued PT services  PT Problem List Decreased strength;Decreased activity tolerance;Decreased balance;Decreased mobility;Decreased knowledge of precautions       PT Treatment Interventions DME instruction;Therapeutic exercise;Balance training;Gait training;Functional mobility training;Therapeutic activities;Patient/family education    PT Goals (Current goals can be found in the Care Plan section)  Acute Rehab PT Goals Patient Stated Goal: None stated, no family present PT Goal Formulation: Patient unable to  participate in goal setting Time For Goal Achievement: 04/09/18 Potential to Achieve Goals: Fair    Frequency Min 4X/week   Barriers to discharge        Co-evaluation               AM-PAC PT "6 Clicks" Mobility  Outcome Measure Help needed turning from your back to your side while in a flat bed without using bedrails?: A Lot Help needed moving from lying on your back to sitting on the side of a flat bed without using bedrails?: A Lot Help needed moving to and from a bed to a chair (including a wheelchair)?: Total Help needed standing up from a chair using your arms (e.g., wheelchair or bedside chair)?: Total Help needed to walk in hospital room?: Total Help needed climbing 3-5 steps with a railing? : Total 6 Click Score: 8    End of Session Equipment Utilized During Treatment: Gait belt Activity Tolerance: Treatment limited secondary to agitation Patient left: in bed;with bed alarm set;with call bell/phone within reach Nurse Communication: Mobility status;Other (comment)(NT assisted for replacing pure wick) PT Visit Diagnosis: Other abnormalities of gait and mobility (R26.89)    Time: 1007-1219 PT Time Calculation (min) (ACUTE ONLY): 23 min   Charges:   PT Evaluation $PT Eval Moderate Complexity: 1 Mod PT Treatments $Therapeutic Activity: 8-22 mins        Sheran Lawless, PT Acute Rehabilitation Services [phone] 03/26/2018   Elray Mcgregor 03/26/2018, 11:29 AM

## 2018-03-27 ENCOUNTER — Encounter: Payer: Self-pay | Admitting: Neurology

## 2018-03-27 DIAGNOSIS — G9341 Metabolic encephalopathy: Secondary | ICD-10-CM | POA: Diagnosis present

## 2018-03-27 DIAGNOSIS — E860 Dehydration: Secondary | ICD-10-CM | POA: Diagnosis present

## 2018-03-27 DIAGNOSIS — I10 Essential (primary) hypertension: Secondary | ICD-10-CM | POA: Diagnosis present

## 2018-03-27 DIAGNOSIS — F319 Bipolar disorder, unspecified: Secondary | ICD-10-CM | POA: Diagnosis present

## 2018-03-27 DIAGNOSIS — Z7189 Other specified counseling: Secondary | ICD-10-CM | POA: Diagnosis not present

## 2018-03-27 DIAGNOSIS — I455 Other specified heart block: Secondary | ICD-10-CM | POA: Diagnosis not present

## 2018-03-27 DIAGNOSIS — E039 Hypothyroidism, unspecified: Secondary | ICD-10-CM | POA: Diagnosis present

## 2018-03-27 DIAGNOSIS — R32 Unspecified urinary incontinence: Secondary | ICD-10-CM | POA: Diagnosis present

## 2018-03-27 DIAGNOSIS — B964 Proteus (mirabilis) (morganii) as the cause of diseases classified elsewhere: Secondary | ICD-10-CM | POA: Diagnosis present

## 2018-03-27 DIAGNOSIS — G300 Alzheimer's disease with early onset: Secondary | ICD-10-CM | POA: Diagnosis not present

## 2018-03-27 DIAGNOSIS — I480 Paroxysmal atrial fibrillation: Secondary | ICD-10-CM | POA: Diagnosis present

## 2018-03-27 DIAGNOSIS — I959 Hypotension, unspecified: Secondary | ICD-10-CM | POA: Diagnosis not present

## 2018-03-27 DIAGNOSIS — R4701 Aphasia: Secondary | ICD-10-CM | POA: Diagnosis present

## 2018-03-27 DIAGNOSIS — Z7982 Long term (current) use of aspirin: Secondary | ICD-10-CM | POA: Diagnosis not present

## 2018-03-27 DIAGNOSIS — N179 Acute kidney failure, unspecified: Secondary | ICD-10-CM | POA: Diagnosis present

## 2018-03-27 DIAGNOSIS — I4819 Other persistent atrial fibrillation: Secondary | ICD-10-CM | POA: Diagnosis present

## 2018-03-27 DIAGNOSIS — R7989 Other specified abnormal findings of blood chemistry: Secondary | ICD-10-CM | POA: Diagnosis present

## 2018-03-27 DIAGNOSIS — G309 Alzheimer's disease, unspecified: Secondary | ICD-10-CM

## 2018-03-27 DIAGNOSIS — R4182 Altered mental status, unspecified: Secondary | ICD-10-CM | POA: Diagnosis not present

## 2018-03-27 DIAGNOSIS — F0280 Dementia in other diseases classified elsewhere without behavioral disturbance: Secondary | ICD-10-CM | POA: Diagnosis present

## 2018-03-27 DIAGNOSIS — M5136 Other intervertebral disc degeneration, lumbar region: Secondary | ICD-10-CM | POA: Diagnosis present

## 2018-03-27 DIAGNOSIS — G20 Parkinson's disease: Secondary | ICD-10-CM | POA: Diagnosis present

## 2018-03-27 DIAGNOSIS — E782 Mixed hyperlipidemia: Secondary | ICD-10-CM | POA: Diagnosis not present

## 2018-03-27 DIAGNOSIS — Z66 Do not resuscitate: Secondary | ICD-10-CM | POA: Diagnosis present

## 2018-03-27 DIAGNOSIS — E785 Hyperlipidemia, unspecified: Secondary | ICD-10-CM

## 2018-03-27 DIAGNOSIS — K219 Gastro-esophageal reflux disease without esophagitis: Secondary | ICD-10-CM | POA: Diagnosis present

## 2018-03-27 DIAGNOSIS — G2581 Restless legs syndrome: Secondary | ICD-10-CM | POA: Diagnosis present

## 2018-03-27 DIAGNOSIS — Z515 Encounter for palliative care: Secondary | ICD-10-CM | POA: Diagnosis present

## 2018-03-27 DIAGNOSIS — N390 Urinary tract infection, site not specified: Secondary | ICD-10-CM | POA: Diagnosis present

## 2018-03-27 DIAGNOSIS — R296 Repeated falls: Secondary | ICD-10-CM | POA: Diagnosis present

## 2018-03-27 MED ORDER — SODIUM CHLORIDE 0.9 % IV SOLN
INTRAVENOUS | Status: AC
  Administered 2018-03-27 – 2018-03-28 (×2): via INTRAVENOUS

## 2018-03-27 NOTE — NC FL2 (Signed)
  [place] MEDICAID FL2 LEVEL OF CARE SCREENING TOOL     IDENTIFICATION  Patient Name: Jessica Hayes Birthdate: [birth year] Sex: female Admission Date (Current Location): 03/25/2018  [facility] and [place] Number:  Producer, television/film/video and Address:  [facility], [address] [place], [place] [address]      Provider Number: [id number]  Attending Physician Name and Address:  Clydie Braun, MD  Relative Name and Phone Number:       Current Level of Care: Hospital Recommended Level of Care: Skilled Nursing Facility Prior Approval Number:    Date Approved/Denied:   PASRR Number:    Discharge Plan: SNF    Current Diagnoses: Patient Active Problem List   Diagnosis Date Noted  . Dehydration 03/26/2018  . Alzheimer's dementia (HCC) 03/26/2018  . Essential hypertension 03/26/2018  . Hyperlipidemia 03/26/2018  . Hypothyroidism 03/26/2018  . AMS (altered mental status) 03/25/2018    Orientation RESPIRATION BLADDER Height & Weight     Self  Normal Incontinent Weight: 170 lb 6.7 oz (77.3 kg) Height:  5\' 4"  (162.6 cm)  BEHAVIORAL SYMPTOMS/MOOD NEUROLOGICAL BOWEL NUTRITION STATUS      Continent Diet(see DC summary)  AMBULATORY STATUS COMMUNICATION OF NEEDS Skin   Extensive Assist Verbally Normal                       Personal Care Assistance Level of Assistance  Bathing, Feeding, Dressing Bathing Assistance: Maximum assistance Feeding assistance: Limited assistance Dressing Assistance: Maximum assistance     Functional Limitations Info  Sight, Hearing, Speech Sight Info: Adequate Hearing Info: Adequate Speech Info: Adequate    SPECIAL CARE FACTORS FREQUENCY  PT (By licensed PT), OT (By licensed OT), Speech therapy     PT Frequency: 5x/wk OT Frequency: 5x/wk     Speech Therapy Frequency: 5x/wk      Contractures Contractures Info: Not present    Additional Factors Info  Code Status, Allergies Code Status Info: DNR Allergies  Info: Demerol Meperidine Hcl           Current Medications (03/27/2018):  This is the current hospital active medication list Current Facility-Administered Medications  Medication Dose Route Frequency Provider Last Rate Last Dose  . acetaminophen (TYLENOL) tablet 650 mg  650 mg Oral Q6H PRN John Giovanni, MD       Or  . acetaminophen (TYLENOL) suppository 650 mg  650 mg Rectal Q6H PRN John Giovanni, MD      . cefTRIAXone (ROCEPHIN) 1 g in sodium chloride 0.9 % 100 mL IVPB  1 g Intravenous QHS John Giovanni, MD   Stopped at 03/26/18 2240  . enoxaparin (LOVENOX) injection 40 mg  40 mg Subcutaneous Daily John Giovanni, MD   40 mg at 03/27/18 4259  . hydrALAZINE (APRESOLINE) injection 20 mg  20 mg Intravenous Once Kirby-Graham, Beather Arbour, NP      . hydrALAZINE (APRESOLINE) injection 5 mg  5 mg Intravenous Q4H PRN John Giovanni, MD      . metoprolol tartrate (LOPRESSOR) injection 5 mg  5 mg Intravenous Q6H Vann, Jessica U, DO   5 mg at 03/27/18 5638     Discharge Medications: Please see discharge summary for a list of discharge medications.  Relevant Imaging Results:  Relevant Lab Results:   Additional Information SS#: [id number]  Baldemar Lenis, LCSW

## 2018-03-27 NOTE — Evaluation (Signed)
Clinical/Bedside Swallow Evaluation Patient Details  Name: Jessica Hayes MRN: [id number] Date of Birth: [birth year]  Today's Date: 03/27/2018 Time: SLP Start Time (ACUTE ONLY): 0930 SLP Stop Time (ACUTE ONLY): 1005 SLP Time Calculation (min) (ACUTE ONLY): 35 min  Past Medical History:  Past Medical History:  Diagnosis Date  . Alzheimer disease (HCC)   . Anxiety   . Bipolar disorder, unspecified (HCC)   . Dementia (HCC)   . Depressive disorder   . GERD (gastroesophageal reflux disease)   . Hypercholesteremia   . Hypertension   . Insomnia   . Lumbar disc disease    degenerative  . Parkinson disease (HCC)   . Restless leg syndrome   . Thyroid disease    hypothyroid  . UTI (urinary tract infection)   . Weakness    Past Surgical History: History reviewed. No pertinent surgical history. HPI:  73 y.o. female with PMH: GERD, Alzheimer's dementia, Parkinson's disease, anxiety, bipolar disorder, hypertension, hyperlipidemia, hypothyroidism. MRI negative, CXR = mild BLL atelectasis   Assessment / Plan / Recommendation Clinical Impression  Pt was resting upon arrival of SLP, but awakened easily. Oral care was completed with suction. Pt is edentulous upper, missing some lower dentition. Oral cavity was dry due to open mouth breathing. Slight white secretions on soft palate were removed easily with suction. Oral motor strength and function are WFL. Pt accepted trials of ice chips, thin liquid, puree, and solid. All consistencies except solid were tolerated without oral deficit or overt s/s aspiration. Pt was unable to demonstrate ability to chew graham cracker, and this was removed from pt's mouth. No oral residue was noted following thin or puree trials. At this time, recommend puree diet with thin liquids via cup or straw (per pt preference) Meds whole, one at a time, with liquid. Pt has a history of GERD, so upright position is recommended not only during po intake, but for 30 minutes  afterward. Aspiration Risk is increased due to diagnoses of Dementia and Parkinson's disease. [facility] swallow [facility] and discussed with RN. SLP will follow briefly for assessment of diet tolerance and education.     SLP Visit Diagnosis: Dysphagia, unspecified (R13.10)    Aspiration Risk  Moderate aspiration risk;Mild aspiration risk    Diet Recommendation Dysphagia 1 (Puree);Thin liquid   Liquid Administration via: Cup;Straw Medication Administration: Whole meds with liquid Supervision: Staff to assist with self feeding;Full supervision/cueing for compensatory strategies Compensations: Minimize environmental distractions;Slow rate;Small sips/bites Postural Changes: Remain upright for at least 30 minutes after po intake;Seated upright at 90 degrees    Other  Recommendations Oral Care Recommendations: Oral care QID   Follow up Recommendations 24 hour supervision/assistance;Skilled Nursing facility      Frequency and Duration min 1 x/week  1 week       Prognosis Prognosis for Safe Diet Advancement: Fair Barriers to Reach Goals: Cognitive deficits      Swallow Study   General Date of Onset: 03/25/18 HPI: 73 y.o. female with PMH: GERD, Alzheimer's dementia, Parkinson's disease, anxiety, bipolar disorder, hypertension, hyperlipidemia, hypothyroidism. MRI negative, CXR = mild BLL atelectasis Type of Study: Bedside Swallow Evaluation Previous Swallow Assessment: none Diet Prior to this Study: NPO Temperature Spikes Noted: No Respiratory Status: Room air History of Recent Intubation: No Oral Cavity Assessment: Dry Oral Care Completed by SLP: Yes Oral Cavity - Dentition: Missing dentition(edentulous upper, missing teeth lower) Self-Feeding Abilities: Total assist Patient Positioning: Upright in bed Baseline Vocal Quality: Normal Volitional Cough: Cognitively unable  to elicit Volitional Swallow: Unable to elicit    Oral/Motor/Sensory Function Overall Oral  Motor/Sensory Function: Within functional limits   Ice Chips Ice chips: Within functional limits Presentation: Spoon   Thin Liquid Thin Liquid: Within functional limits Presentation: Straw    Nectar Thick Nectar Thick Liquid: Not tested   Honey Thick Honey Thick Liquid: Not tested   Puree Puree: Impaired Presentation: Spoon Oral Phase Impairments: Poor awareness of bolus Oral Phase Functional Implications: Prolonged oral transit   Solid     Solid: Impaired(unable to demonstrate the ability to chew solids) Oral Phase Impairments: Impaired mastication;Poor awareness of bolus Oral Phase Functional Implications: Impaired mastication     Jessica Hayes B. Murvin Natal, [facility], CCC-SLP Speech Language Pathologist [phone]  Jessica Hayes 03/27/2018,10:16 AM

## 2018-03-27 NOTE — Progress Notes (Signed)
[facility] PROGRESS NOTE  Jessica Hayes HRC:[id number] DOB: [birth year] DOA: 03/25/2018 PCP: Wilmer Floor., MD  Assessment/Plan:  Possible Aphasia, altered mental status, generalized weakness -it is unclear of what the patient's mental baseline is - Patient is afebrile and does not have leukocytosis - Lactic acid normal: Recently treated with Bactrim for a UTI at her facility. UA showing large amount of leukocytes, 6-10 WBCs/ HPF,large amount of leukocytes, and negative nitrite.culture + protieus mirabilis.Chest x-ray not suggestive of pneumonia. Head CT negative for acute finding.Neuro exam nonfocal - continue ceftriaxone day #2 -Brain MRI: motion artifact -PT evaluation: max assist -B12/ammonia and TSH normal  UTI. Patient treated with Bactrim at facility. Urine culture as noted above. -rocephin day #2  Dehydration/ ?AKI BUN 25. Serum creatinine 1.34. Little change this am. No prior labs in the chart. Unclear what her baseline renal function is -continue IV fluid hydration -trend BMP and follow urine output  Alzheimer's dementia -see Above -will continue home meds when able  Hypertension. Poor control -await home meds recs -PRN IV medications for now  Hyperlipidemia -await home meds  Hypothyroidism -TSH normal  Code Status: dnr Family Communication: none present Disposition Plan: back to facility when ready   Consultants:    Procedures:  none  Antibiotics:  Rocephin 03/26/18>>  HPI/Subjective: Jessica Hayes is an 73 y.o. female with medical history significant ofAlzheimer'sdementia, Parkinson's disease, anxiety, bipolar disorder, hypertension, hyperlipidemia, hypothyroidism being sent to the hospital from her nursing home for evaluation of stroke-like symptoms. Per triage note, patient's RN this morning was not her normal RN and was unsure of her baseline. Per EMS, RN reported that patient had weakness this morning and  required full assistance to get in her wheelchair. Patient has previously been seen walking in the hallways with awalker. Patient went to her dentist appointment and it was noted that her speech was slurred compared to her baseline and she appeared confused.   Objective: Vitals:   03/27/18 1240 03/27/18 1349  BP: (!) 136/100 (!) 141/75  Pulse: 70   Resp:  20  Temp: 97.6 F (36.4 C)   SpO2: 97%     Intake/Output Summary (Last 24 hours) at 03/27/2018 1429 Last data filed at 03/27/2018 0600 Gross per 24 hour  Intake 950 ml  Output -  Net 950 ml   Filed Weights   03/25/18 1229 03/26/18 0551  Weight: 90.7 kg 77.3 kg    Exam:   General:  Lying in bed eyes closed hands mitted. Aroused to verbal stimuli  Cardiovascular: rrr no mgr  Respiratory: normal effort BS clear  Abdomen: non-distended non-tender  Musculoskeletal: joints without swelling/erythema   Data Reviewed: Basic Metabolic Panel: Recent Labs  Lab 03/25/18 1235 03/25/18 1700 03/26/18 0356  NA 136  --  137  K 4.4  --  4.2  CL 100  --  105  CO2 25  --  21*  GLUCOSE 116*  --  141*  BUN 25*  --  27*  CREATININE 1.34*  --  1.34*  CALCIUM 9.8  --  9.3  MG  --  2.4  --    Liver Function Tests: Recent Labs  Lab 03/25/18 1235  AST 20  ALT 5  ALKPHOS 103  BILITOT 0.7  PROT 8.6*  ALBUMIN 3.6   No results for input(s): LIPASE, AMYLASE in the last 168 hours. Recent Labs  Lab 03/26/18 0356  AMMONIA 17   CBC: Recent Labs  Lab 03/25/18 1235  WBC 9.9  NEUTROABS  6.6  HGB 14.6  HCT 46.5*  MCV 86.9  PLT 280   Cardiac Enzymes: No results for input(s): CKTOTAL, CKMB, CKMBINDEX, TROPONINI in the last 168 hours. BNP (last 3 results) No results for input(s): BNP in the last 8760 hours.  ProBNP (last 3 results) No results for input(s): PROBNP in the last 8760 hours.  CBG: No results for input(s): GLUCAP in the last 168 hours.  Recent Results (from the past 240 hour(s))  Urine culture     Status:  Abnormal (Preliminary result)   Collection Time: 03/25/18  5:00 PM  Result Value Ref Range Status   Specimen Description URINE, RANDOM  Final   Special Requests   Final    NONE Performed at [facility], [address], [place], [place] [address]    Culture >=100,000 COLONIES/mL PROTEUS MIRABILIS (A)  Final   Report Status PENDING  Incomplete  MRSA PCR Screening     Status: None   Collection Time: 03/26/18  5:38 AM  Result Value Ref Range Status   MRSA by PCR NEGATIVE NEGATIVE Final    Comment:        The GeneXpert MRSA Assay (FDA approved for NASAL specimens only), is one component of a comprehensive MRSA colonization surveillance program. It is not intended to diagnose MRSA infection nor to guide or monitor treatment for MRSA infections. Performed at [facility], [address] [place], [place] [address]      Studies: Ct Head Wo Contrast  Result Date: 03/25/2018 CLINICAL DATA:  Altered level of consciousness. EXAM: CT HEAD WITHOUT CONTRAST TECHNIQUE: Contiguous axial images were obtained from the base of the skull through the vertex without intravenous contrast. COMPARISON:  None. FINDINGS: Brain: No evidence of acute infarction, hemorrhage, hydrocephalus, extra-axial collection or mass lesion/mass effect. There is mild diffuse low-attenuation within the subcortical and periventricular white matter compatible with chronic microvascular disease. Vascular: No hyperdense vessel or unexpected calcification. Skull: Normal. Negative for fracture or focal lesion. Sinuses/Orbits: No acute finding. Other: None IMPRESSION: 1. No acute intracranial abnormalities. 2. Chronic small vessel ischemic change. Electronically Signed   By: Signa Kell M.D.   On: 03/25/2018 14:42   Mr Brain Wo Contrast  Result Date: 03/26/2018 CLINICAL DATA:  Focal neuro deficit.  Altered mental status. EXAM: MRI HEAD WITHOUT CONTRAST TECHNIQUE: Multiplanar, multiecho pulse sequences of the brain and  surrounding structures were obtained without intravenous contrast. COMPARISON:  Head CT from yesterday FINDINGS: Brain: No acute infarction, hemorrhage, hydrocephalus, extra-axial collection or mass lesion. History of dementia but no notable atrophy. Minor periventricular chronic small vessel ischemia. Vascular: Major flow voids are preserved Skull and upper cervical spine: Negative for marrow lesion. Sinuses/Orbits: Negative Other: Significant and progressive motion degradation. IMPRESSION: Significantly motion degraded study that is otherwise unremarkable for age. Electronically Signed   By: Marnee Spring M.D.   On: 03/26/2018 05:00    Scheduled Meds: . enoxaparin (LOVENOX) injection  40 mg Subcutaneous Daily  . hydrALAZINE  20 mg Intravenous Once  . metoprolol tartrate  5 mg Intravenous Q6H   Continuous Infusions: . cefTRIAXone (ROCEPHIN)  IV Stopped (03/26/18 2240)    Principal Problem:   AMS (altered mental status) Active Problems:   Dehydration   Alzheimer's dementia (HCC)   Essential hypertension   Hyperlipidemia   Hypothyroidism    Time spent: 45 min    BLACK,KAREN M NP [facility]. If 7PM-7AM, please contact night-coverage at [email or web address], password [facility] 03/27/2018, 2:29 PM  LOS: 0 days

## 2018-03-27 NOTE — Progress Notes (Signed)
CSW contacted by [facility] to discuss whether patient was returning. CSW obtained information on patient's baseline level of function, per Administrator at [facility]. Prior to admission, patient was able to walk with a walker, but she walked slowly. She was also able to form words and readily communicate her daily needs. She was usually oriented for the most part, but also demonstrated some confusion so they wouldn't have relied on her to make any major decisions if they came up.   Also, per the facility, they say that she is a very hot-natured person at baseline; she does not like a lot of covers, and even if it was cold outside she likes to wear dresses because she usually always feels hot. Facility indicated that when they would try to place more covers on her if they believed she was cold, then she would get agitated with them. Other than that, they have no other notable remarks about her care.  Per chart review, patient currently remains altered from her baseline. CSW to continue to follow for when patient is appropriate to return to SNF.  Blenda Nicely, [place] Clinical Social Worker [phone]

## 2018-03-28 DIAGNOSIS — I4819 Other persistent atrial fibrillation: Secondary | ICD-10-CM | POA: Diagnosis present

## 2018-03-28 DIAGNOSIS — I455 Other specified heart block: Secondary | ICD-10-CM | POA: Diagnosis present

## 2018-03-28 DIAGNOSIS — I10 Essential (primary) hypertension: Secondary | ICD-10-CM

## 2018-03-28 DIAGNOSIS — G300 Alzheimer's disease with early onset: Secondary | ICD-10-CM

## 2018-03-28 DIAGNOSIS — R4182 Altered mental status, unspecified: Secondary | ICD-10-CM

## 2018-03-28 DIAGNOSIS — E039 Hypothyroidism, unspecified: Secondary | ICD-10-CM

## 2018-03-28 DIAGNOSIS — F0280 Dementia in other diseases classified elsewhere without behavioral disturbance: Secondary | ICD-10-CM

## 2018-03-28 DIAGNOSIS — N390 Urinary tract infection, site not specified: Secondary | ICD-10-CM | POA: Diagnosis present

## 2018-03-28 LAB — URINE CULTURE: Culture: >=100000 — AB

## 2018-03-28 LAB — BASIC METABOLIC PANEL
ANION GAP: 8 (ref 5–15)
BUN: 19 mg/dL (ref 8–23)
CALCIUM: 8.6 mg/dL — AB (ref 8.9–10.3)
CO2: 22 mmol/L (ref 22–32)
Chloride: 109 mmol/L (ref 98–111)
Creatinine, Ser: 1.01 mg/dL — ABNORMAL HIGH (ref 0.44–1.00)
GFR calc Af Amer: >60 mL/min (ref >60)
GFR calc non Af Amer: 55 mL/min — ABNORMAL LOW (ref >60)
Glucose, Bld: 133 mg/dL — ABNORMAL HIGH (ref 70–99)
Potassium: 4.1 mmol/L (ref 3.5–5.1)
Sodium: 139 mmol/L (ref 135–145)

## 2018-03-28 LAB — COMPREHENSIVE METABOLIC PANEL
ALK PHOS: 62 U/L (ref 38–126)
ALT: 5 U/L (ref 0–44)
AST: 17 U/L (ref 15–41)
Albumin: 2.3 g/dL — ABNORMAL LOW (ref 3.5–5.0)
Anion gap: 7 (ref 5–15)
BUN: 15 mg/dL (ref 8–23)
CO2: 23 mmol/L (ref 22–32)
Calcium: 8.4 mg/dL — ABNORMAL LOW (ref 8.9–10.3)
Chloride: 109 mmol/L (ref 98–111)
Creatinine, Ser: 0.85 mg/dL (ref 0.44–1.00)
GFR calc Af Amer: >60 mL/min (ref >60)
GFR calc non Af Amer: >60 mL/min (ref >60)
Glucose, Bld: 93 mg/dL (ref 70–99)
Potassium: 3.6 mmol/L (ref 3.5–5.1)
Sodium: 139 mmol/L (ref 135–145)
Total Bilirubin: 0.7 mg/dL (ref 0.3–1.2)
Total Protein: 6.3 g/dL — ABNORMAL LOW (ref 6.5–8.1)

## 2018-03-28 LAB — CBC
HCT: 38.5 % (ref 36.0–46.0)
HCT: 38.9 % (ref 36.0–46.0)
Hemoglobin: 12.4 g/dL (ref 12.0–15.0)
Hemoglobin: 12.1 g/dL (ref 12.0–15.0)
MCH: 28.2 pg (ref 26.0–34.0)
MCH: 27.6 pg (ref 26.0–34.0)
MCHC: 32.2 g/dL (ref 30.0–36.0)
MCHC: 31.1 g/dL (ref 30.0–36.0)
MCV: 87.5 fL (ref 80.0–100.0)
MCV: 88.6 fL (ref 80.0–100.0)
PLATELETS: 159 10*3/uL (ref 150–400)
Platelets: 197 10*3/uL (ref 150–400)
RBC: 4.4 MIL/uL (ref 3.87–5.11)
RBC: 4.39 MIL/uL (ref 3.87–5.11)
RDW: 13.2 % (ref 11.5–15.5)
RDW: 13.3 % (ref 11.5–15.5)
WBC: 10.7 10*3/uL — ABNORMAL HIGH (ref 4.0–10.5)
WBC: 6.4 10*3/uL (ref 4.0–10.5)
nRBC: 0 % (ref 0.0–0.2)
nRBC: 0 % (ref 0.0–0.2)

## 2018-03-28 LAB — TROPONIN I: Troponin I: <0.03 ng/mL (ref <0.03)

## 2018-03-28 LAB — LACTIC ACID, PLASMA: Lactic Acid, Venous: 1.6 mmol/L (ref 0.5–1.9)

## 2018-03-28 MED ORDER — PANTOPRAZOLE SODIUM 40 MG PO TBEC
40.0000 mg | DELAYED_RELEASE_TABLET | Freq: Every day | ORAL | Status: DC
  Administered 2018-03-28 – 2018-03-31 (×4): 40 mg via ORAL
  Filled 2018-03-28 (×4): qty 1

## 2018-03-28 MED ORDER — LEVOTHYROXINE SODIUM 50 MCG PO TABS
50.0000 ug | ORAL_TABLET | Freq: Every day | ORAL | Status: DC
  Administered 2018-03-28 – 2018-03-31 (×4): 50 ug via ORAL
  Filled 2018-03-28 (×5): qty 1

## 2018-03-28 MED ORDER — OXYBUTYNIN CHLORIDE ER 10 MG PO TB24
10.0000 mg | ORAL_TABLET | Freq: Every day | ORAL | Status: DC
  Administered 2018-03-28 – 2018-03-30 (×3): 10 mg via ORAL
  Filled 2018-03-28 (×4): qty 1

## 2018-03-28 MED ORDER — FENOFIBRATE 54 MG PO TABS
54.0000 mg | ORAL_TABLET | Freq: Every day | ORAL | Status: DC
  Administered 2018-03-28 – 2018-03-31 (×4): 54 mg via ORAL
  Filled 2018-03-28 (×4): qty 1

## 2018-03-28 MED ORDER — ASPIRIN EC 81 MG PO TBEC
81.0000 mg | DELAYED_RELEASE_TABLET | Freq: Every day | ORAL | Status: DC
  Administered 2018-03-28 – 2018-03-31 (×4): 81 mg via ORAL
  Filled 2018-03-28 (×4): qty 1

## 2018-03-28 MED ORDER — CARVEDILOL 6.25 MG PO TABS
6.2500 mg | ORAL_TABLET | Freq: Two times a day (BID) | ORAL | Status: DC
  Administered 2018-03-28 (×2): 6.25 mg via ORAL
  Filled 2018-03-28 (×2): qty 1

## 2018-03-28 MED ORDER — OLANZAPINE 2.5 MG PO TABS
15.0000 mg | ORAL_TABLET | Freq: Every day | ORAL | Status: DC
  Administered 2018-03-28 – 2018-03-30 (×3): 15 mg via ORAL
  Filled 2018-03-28 (×3): qty 6

## 2018-03-28 MED ORDER — ROPINIROLE HCL 1 MG PO TABS
0.5000 mg | ORAL_TABLET | Freq: Every day | ORAL | Status: DC
  Administered 2018-03-28 – 2018-03-30 (×3): 0.5 mg via ORAL
  Filled 2018-03-28 (×3): qty 1

## 2018-03-28 MED ORDER — LACTATED RINGERS IV BOLUS
1000.0000 mL | Freq: Once | INTRAVENOUS | Status: AC
  Administered 2018-03-28: 1000 mL via INTRAVENOUS

## 2018-03-28 MED ORDER — LACTATED RINGERS IV BOLUS
500.0000 mL | Freq: Once | INTRAVENOUS | Status: AC
  Administered 2018-03-28: 500 mL via INTRAVENOUS

## 2018-03-28 MED ORDER — ESCITALOPRAM OXALATE 10 MG PO TABS
15.0000 mg | ORAL_TABLET | Freq: Every day | ORAL | Status: DC
  Administered 2018-03-28 – 2018-03-31 (×4): 15 mg via ORAL
  Filled 2018-03-28 (×4): qty 2

## 2018-03-28 MED ORDER — LACTINEX PO CHEW
1.0000 | CHEWABLE_TABLET | Freq: Every day | ORAL | Status: DC
  Administered 2018-03-28 – 2018-03-31 (×4): 1 via ORAL
  Filled 2018-03-28 (×5): qty 1

## 2018-03-28 MED ORDER — CEPHALEXIN 250 MG PO CAPS
250.0000 mg | ORAL_CAPSULE | Freq: Four times a day (QID) | ORAL | Status: DC
  Administered 2018-03-28 – 2018-03-31 (×13): 250 mg via ORAL
  Filled 2018-03-28 (×13): qty 1

## 2018-03-28 MED ORDER — TEMAZEPAM 7.5 MG PO CAPS
7.5000 mg | ORAL_CAPSULE | Freq: Every day | ORAL | Status: DC
  Administered 2018-03-28 – 2018-03-30 (×3): 7.5 mg via ORAL
  Filled 2018-03-28 (×3): qty 1

## 2018-03-28 MED ORDER — CARBIDOPA-LEVODOPA 25-100 MG PO TABS
1.0000 | ORAL_TABLET | Freq: Three times a day (TID) | ORAL | Status: DC
  Administered 2018-03-28 – 2018-03-31 (×11): 1 via ORAL
  Filled 2018-03-28 (×11): qty 1

## 2018-03-28 NOTE — Consult Note (Addendum)
CONSULTATION NOTE   Patient Name: Jessica Hayes Date of Encounter: 03/28/2018 Cardiologist: No primary care provider on file.  Chief Complaint   No complaints  Patient Profile   73 yo female with Alzheimer's dementia, parkinson's disease, hypothyroidism, HTN, HLD and resides in SNF, presents with acute weakness, AMS and stroke symptoms, being treated for UTI, but found to have afib with long pauses of up to 11 seconds. Cardiology is consulted for management.  HPI   Jessica Hayes is a 73 y.o. female who is being seen today for the evaluation of afib with pauses at the request of Dr. Caleb PoppNettey. This is a 73 year old female with a history of Alzheimer's disease, Parkinson's disease, bipolar disorder, dementia and depression, hypertension, dyslipidemia and resident of skilled nursing facility.  She presented with altered mental status and possible strokelike symptoms.  She was treated for UTI and started on Bactrim.  She however has had confusion and was thought to be dehydrated.  Telemetry demonstrates new onset atrial fibrillation with significant pauses up to 11 seconds.  She is having frequent pauses.  Blood pressure also demonstrates lability at times with systolics over 170 and diastolics in the 100s 2 low blood pressures with systolics in the 90s and diastolics in the 60s.  PMHx   Past Medical History:  Diagnosis Date  . Alzheimer disease (HCC)   . Anxiety   . Bipolar disorder, unspecified (HCC)   . Dementia (HCC)   . Depressive disorder   . GERD (gastroesophageal reflux disease)   . Hypercholesteremia   . Hypertension   . Insomnia   . Lumbar disc disease    degenerative  . Parkinson disease (HCC)   . Restless leg syndrome   . Thyroid disease    hypothyroid  . UTI (urinary tract infection)   . Weakness     History reviewed. No pertinent surgical history.  FAMHx   History reviewed. No pertinent family history.  SOCHx    has an unknown smoking status. She has  never used smokeless tobacco. She reports previous alcohol use. She reports previous drug use.  Outpatient Medications   No current facility-administered medications on file prior to encounter.    Current Outpatient Medications on File Prior to Encounter  Medication Sig Dispense Refill  . aspirin EC 81 MG tablet Take 81 mg by mouth daily.    . carbidopa-levodopa (SINEMET IR) 25-100 MG tablet Take 1 tablet by mouth 3 (three) times daily.    . carvedilol (COREG) 6.25 MG tablet Take 6.25 mg by mouth 2 (two) times daily with a meal.    . cyclobenzaprine (FLEXERIL) 5 MG tablet Take 5 mg by mouth at bedtime.    Marland Kitchen. escitalopram (LEXAPRO) 10 MG tablet Take 15 mg by mouth daily.    . fenofibrate (TRICOR) 48 MG tablet Take 48 mg by mouth daily.    . furosemide (LASIX) 20 MG tablet Take 20 mg by mouth.    Marland Kitchen. LACTOBACILLUS PO Take 1 capsule by mouth daily.    Marland Kitchen. levothyroxine (SYNTHROID, LEVOTHROID) 50 MCG tablet Take 50 mcg by mouth daily before breakfast.    . mirabegron ER (MYRBETRIQ) 50 MG TB24 tablet Take 50 mg by mouth daily.    Marland Kitchen. OLANZapine (ZYPREXA) 15 MG tablet Take 15 mg by mouth at bedtime.    Marland Kitchen. omeprazole (PRILOSEC) 20 MG capsule Take 20 mg by mouth every other day.    . oxybutynin (DITROPAN-XL) 10 MG 24 hr tablet Take 10 mg by mouth at  bedtime.    Marland Kitchen rOPINIRole (REQUIP) 0.5 MG tablet Take 0.5 mg by mouth at bedtime.    . sulfamethoxazole-trimethoprim (BACTRIM DS,SEPTRA DS) 800-160 MG tablet Take 1 tablet by mouth 2 (two) times daily.    . temazepam (RESTORIL) 7.5 MG capsule Take 7.5 mg by mouth at bedtime.    . traMADol (ULTRAM) 50 MG tablet Take 50 mg by mouth daily.      Inpatient Medications    Scheduled Meds: . aspirin EC  81 mg Oral Daily  . carbidopa-levodopa  1 tablet Oral TID WC  . cephALEXin  250 mg Oral Q6H  . enoxaparin (LOVENOX) injection  40 mg Subcutaneous Daily  . escitalopram  15 mg Oral Daily  . fenofibrate  54 mg Oral Daily  . hydrALAZINE  20 mg Intravenous Once  .  lactobacillus acidophilus & bulgar  1 tablet Oral Daily  . levothyroxine  50 mcg Oral QAC breakfast  . OLANZapine  15 mg Oral QHS  . oxybutynin  10 mg Oral QHS  . pantoprazole  40 mg Oral Daily  . rOPINIRole  0.5 mg Oral QHS  . temazepam  7.5 mg Oral QHS    Continuous Infusions: . lactated ringers 1,000 mL (03/28/18 1820)  . lactated ringers 500 mL (03/28/18 1818)    PRN Meds: acetaminophen **OR** acetaminophen, hydrALAZINE   ALLERGIES   Allergies  Allergen Reactions  . Demerol [Meperidine Hcl] Other (See Comments)    Unknown per MAR    ROS   Pertinent items noted in HPI and remainder of comprehensive ROS otherwise negative.  Vitals   Vitals:   03/28/18 0738 03/28/18 0740 03/28/18 1140 03/28/18 1636  BP: (!) 230/187 92/66 100/60 (!) 104/57  Pulse: 71  67 67  Resp: (!) 21  16   Temp: 99.8 F (37.7 C)  98.1 F (36.7 C)   TempSrc: Axillary  Oral   SpO2: 99%  97%   Weight:      Height:        Intake/Output Summary (Last 24 hours) at 03/28/2018 1827 Last data filed at 03/28/2018 0600 Gross per 24 hour  Intake 1085 ml  Output -  Net 1085 ml   Filed Weights   03/25/18 1229 03/26/18 0551  Weight: 90.7 kg 77.3 kg    Physical Exam   General appearance: alert, no distress and pale Neck: no carotid bruit, no JVD and thyroid not enlarged, symmetric, no tenderness/mass/nodules Lungs: clear to auscultation bilaterally Heart: irregularly irregular rhythm Abdomen: soft, non-tender; bowel sounds normal; no masses,  no organomegaly Extremities: extremities normal, atraumatic, no cyanosis or edema Pulses: 2+ and symmetric Skin: Skin color, texture, turgor normal. No rashes or lesions Neurologic: Mental status: oriented to self, but not place, date or year, rest tremor noted Psych: Flat affect  Labs   Results for orders placed or performed during the hospital encounter of 03/25/18 (from the past 48 hour(s))  Basic metabolic panel     Status: Abnormal   Collection  Time: 03/28/18  5:31 AM  Result Value Ref Range   Sodium 139 135 - 145 mmol/L   Potassium 4.1 3.5 - 5.1 mmol/L   Chloride 109 98 - 111 mmol/L   CO2 22 22 - 32 mmol/L   Glucose, Bld 133 (H) 70 - 99 mg/dL   BUN 19 8 - 23 mg/dL   Creatinine, Ser 8.18 (H) 0.44 - 1.00 mg/dL   Calcium 8.6 (L) 8.9 - 10.3 mg/dL   GFR calc non Af Amer 55 (L) >  60 mL/min   GFR calc Af Amer >60 >60 mL/min   Anion gap 8 5 - 15    Comment: Performed at [facility], [address], [place], [place] [address]  CBC     Status: Abnormal   Collection Time: 03/28/18  5:31 AM  Result Value Ref Range   WBC 10.7 (H) 4.0 - 10.5 K/uL   RBC 4.40 3.87 - 5.11 MIL/uL   Hemoglobin 12.4 12.0 - 15.0 g/dL   HCT 53.7 94.3 - 27.6 %   MCV 87.5 80.0 - 100.0 fL   MCH 28.2 26.0 - 34.0 pg   MCHC 32.2 30.0 - 36.0 g/dL   RDW 14.7 09.2 - 95.7 %   Platelets 197 150 - 400 K/uL   nRBC 0.0 0.0 - 0.2 %    Comment: Performed at [facility], [address], [place], [place] [address]    ECG   Afib at 4 - Personally Reviewed  Telemetry   AFib with long pauses, at times >10 seconds - Personally Reviewed  Radiology   No results found.  Cardiac Studies   N/A  Impression   Principal Problem:   AMS (altered mental status) Active Problems:   Dehydration   Alzheimer's dementia (HCC)   Essential hypertension   Hyperlipidemia   Hypothyroidism   Persistent atrial fibrillation   Sinus pause   Recommendation   1. Jessica Hayes is a 73 year old female with altered mental status and Alzheimer's dementia with Parkinson's disease who desires to be DNR.  She was informed that she is now having persistent atrial fibrillation with long pauses and does run the risk of possible anus arrest and cardiac arrest subsequent to that.  Certainly we need to avoid AV nodal blocking agents and will hold further doses of beta-blocker.  When I discussed the possibility of temporary or permanent pacemaking she said that she was not interested  in that or any more invasive procedures, even if they would prolong her life.  She does not seem to be symptomatic with these pauses although they do last several seconds.  Based on her response I think there is little else that we can do other than avoiding potential triggers.  She did appear only to be oriented to self however was very adamant about not pursuing additional procedures and seemed to comprehend the consequences of that decision.  Attempted to contact the patient's daughter at both listed numbers however was unable to make contact.  Would consider palliative care evaluation as necessary.  Thanks for the consultation. Cardiology will be available as needed.  Time Spent Directly with Patient:  I have spent a total of 45 minutes with the patient reviewing hospital notes, telemetry, EKGs, labs and examining the patient as well as establishing an assessment and plan that was discussed personally with the patient.  > 50% of time was spent in direct patient care.  Length of Stay:  LOS: 1 day   Chrystie Nose, MD, [facility], FACP  [place]  [place]  Medical Director of the [facility] Lipid Disorders &  Cardiovascular Risk Reduction Clinic Diplomate of the American Board of Clinical Lipidology Attending Cardiologist  Direct Dial: [phone]  Fax: [phone]  Website:  [email or web address] Hilty 03/28/2018, 6:27 PM  \

## 2018-03-28 NOTE — Progress Notes (Signed)
BP currently 101/62 1652 5 seconds of asostle according to CCM 1702 11 seconds of asystole pause from CCM Dr Caleb Popp paged and on the way to room. 1707 pt had a 4 second asystole pause 1721 pt had a 10.35 second pause  During one of those asystoles I was in the room giving patient medicine. Patient's eyes rolled into the back of her head and for just a few seconds. BP was 87/54. Dr Mal Misty in room looking over patient, putting in orders, pt is now a progressive patient and cardiology is involved.

## 2018-03-28 NOTE — Progress Notes (Signed)
Physical Therapy Treatment Patient Details Name: Jessica HumphreyCarolyn A Christenbury MRN: [id number] DOB: 1945/04/09 Today's Date: 03/28/2018    History of Present Illness 73 y.o. female with medical history significant of Alzheimer's dementia, Parkinson's disease, anxiety, bipolar disorder, hypertension, hyperlipidemia, hypothyroidism being sent to the hospital from her nursing home for evaluation of stroke-like symptoms with some slurred speech and inability to walk per staff at facility    PT Comments    Pt was seen for mobility but declined to get up.  Have worked on LE ROM and pt is reporting not remembering any of the exercises.  Follow acutely for as much strengthening and mobility as pt will permit, then transition to SNF when medically ready.  Pt is confused but is being cooperative with the exercises.   Follow Up Recommendations  SNF     Equipment Recommendations  None recommended by PT    Recommendations for Other Services       Precautions / Restrictions Precautions Precautions: Fall Restrictions Weight Bearing Restrictions: No    Mobility  Bed Mobility                  Transfers                    Ambulation/Gait                 Stairs             Wheelchair Mobility    Modified Rankin (Stroke Patients Only)       Balance                                            Cognition Arousal/Alertness: Awake/alert Behavior During Therapy: Flat affect Overall Cognitive Status: No family/caregiver present to determine baseline cognitive functioning                                 General Comments: history of dementia      Exercises General Exercises - Lower Extremity Ankle Circles/Pumps: AROM;AAROM;Both;5 reps Quad Sets: AROM;AAROM;Both;10 reps Gluteal Sets: AROM;Both;10 reps Heel Slides: AROM;AAROM;Both;10 reps Hip ABduction/ADduction: AROM;AAROM;Both;10 reps Straight Leg Raises: AROM;AAROM;Both;10 reps Hip  Flexion/Marching: AROM;AAROM;Both;10 reps    General Comments        Pertinent Vitals/Pain Pain Assessment: No/denies pain    Home Living                      Prior Function            PT Goals (current goals can now be found in the care plan section) Acute Rehab PT Goals PT Goal Formulation: Patient unable to participate in goal setting Progress towards PT goals: Not progressing toward goals - comment    Frequency    Min 4X/week      PT Plan Current plan remains appropriate    Co-evaluation              AM-PAC PT "6 Clicks" Mobility   Outcome Measure  Help needed turning from your back to your side while in a flat bed without using bedrails?: A Lot Help needed moving from lying on your back to sitting on the side of a flat bed without using bedrails?: A Lot Help needed moving to and from a bed to a chair (including a  wheelchair)?: Total Help needed standing up from a chair using your arms (e.g., wheelchair or bedside chair)?: Total Help needed to walk in hospital room?: Total Help needed climbing 3-5 steps with a railing? : Total 6 Click Score: 8    End of Session   Activity Tolerance: Patient tolerated treatment well Patient left: in bed;with bed alarm set;with call bell/phone within reach Nurse Communication: Mobility status;Other (comment) PT Visit Diagnosis: Other abnormalities of gait and mobility (R26.89)     Time: 5027-7412 PT Time Calculation (min) (ACUTE ONLY): 26 min  Charges:  $Therapeutic Exercise: 23-37 mins                   Ivar Drape 03/28/2018, 5:21 PM.   Samul Dada, PT MS Acute Rehab Dept. Number: [facility] [id number] and [facility] [phone]

## 2018-03-28 NOTE — Progress Notes (Addendum)
Attending MD note  Patient was seen, examined,treatment plan was discussed with the PA-S.  I have personally reviewed the clinical findings, lab, imaging studies and management of this patient in detail. I agree with the documentation, as recorded by the PA-S  Jessica Hayes is a 73 y.o. year old female with medical history significant for Parkinson's dementia, depression, hypothyroidism, GERD, urinary incontinence who presented on 03/25/2018 from her skilled nursing facility with reports of worsening weakness, slurred speech and confusion worse from baseline concerning for strokelike symptoms and was found to have urinary tract infection.  Since hospitalization patient has been on IV Rocephin which was changed to Keflex based on urine culture data. Patient was noted to be in atrial fibrillation with intermittent hypotensive blood pressure (systolics 80s to 90s) requiring total of 1.5 L LR bolus.  Was also found to have multiple episodes of sinus pauses on telemetry with symptoms of decreased alertness/mentation and chest discomfort.  Given acute change in clinical status cardiology was consulted as well  Physical: Vitals:   03/28/18 1140 03/28/18 1636  BP: 100/60 (!) 104/57  Pulse: 67 67  Resp: 16   Temp: 98.1 F (36.7 C)   SpO2: 97%     Constitutional: Elderly female, no distress, lying in bed Eyes: EOMI, anicteric, normal conjunctivae ENMT: Oropharynx with moist mucous membranes, normal dentition Neck: FROM,  Cardiovascular: Irregularly irregular rhythm, no tachycardia with no peripheral edema Respiratory: Normal respiratory effort on room air, clear breath sounds  Abdomen: Soft,non-tender,  Skin: No rash ulcers, or lesions. Without skin tenting  Neurologic: Grossly no focal neuro deficit. Psychiatric: Flat affect. Mental status alert, oriented to self, place (city, not to hospital) not oriented to time or context  Plan  1. Persistent atrial fibrillation, new diagnosis,   Currently not in RVR heart rate has remained less than 110.  Previously on beta-blocker but has been discontinued due to hypotension and now sinus pauses. TSH wnl. Was having some chest discomfort possibly related to new arrhythmia, follow-up troponin, pending cardiology recommendations  2. Sinus pauses.  Unclear cause patient did receive last dose of carvedilol around 5 PM.  Possibly related to ongoing atrial fibrillation.  Holding further beta-blockers. Addendum: Cardiology consulted per patient declines any invasive interventions including temporary pacemaker or even permanent, discussed with cardiology she seemed aware of the risk and concern that this could lead to sinus arrest if it persists.  Cardiology tried to contact daughter with no success, I also tried to contact daughter Jessica Hayes back him at listed number of [phone] with no success, left a message for her to call back to speak with me.  3. Hypotension, improving.  Blood pressure now in the low 100s systolic after LR bolus (improved from 80s to 90s).  Have discontinued all home blood pressure medications.  Following stat lactic acid to ensure perfusion  4. UTI secondary to Proteus.  Failed outpatient Bactrim.  Transition from IV Rocephin to Keflex.  Rest as below  Jessica Hayes  [facility]    PROGRESS NOTE  Jessica Hayes BDZ:[id number] DOB: Oct 15, 1945 DOA: 03/25/2018 PCP: Wilmer Floor., MD  HPI/Brief Narrative  Jessica Hayes is a 73 y.o. year old female with medical history significant for Alzheimer's Dementia, Parkinson's Disease, Hypothyroidism, HTN, HLD who presented to the ED from her SNF on 03/25/2018 with AMS and possible stroke sx's and admitted for AMS and gait abnormality. Per nurse triage note, the pt's RN at the SNF yesterday was not  her normal nurse and was unsure of the pt's baseline. Per EMS, RN reported patient was experiencing weakness this morning requiring full assistance to get into her  wheelchair when she is usually able to walk hallways with a walker. She recently was diagnosed with a UTI and started on Bactrim at the SNF.   During her course in the ED, the patient's showed mildly elevated BUN and sCr. She was started on IV fluids and her BUN/Cr improved in labs today. Her UA showed large leukocytes, WBC's, and bacteria. A culture was then sent. Her CT head was negative and neuro exam was non-focal. Her B12, ammonia, and TSH were all WNL. PT evaluation placed the patient on max assist. Speech pathology evaluated patient and gave diet recommendation and swallow precautions for the patient's dysphagia.   Subjective Patient is alert and awake today. She is oriented to self and time. She is aware she is in [place], but it is not clear if she is aware she is in the hospital. She reports she is still experiencing some dysuria and reports an uncomfortable feeling with her bladder. She denies any nausea, vomiting, or abdominal pain. She denies any other pain. She asks several times, "when do I get to go home".   Assessment/Plan: #1 AMS-Patient is oriented to self and time. She is aware of what city she is in, but it is not clear that she is aware she is in the hospital. During her interview, she pauses every few minutes and states she is confused. It is unclear what the patient's baseline is. She is asking to go back to the SNF at this point in time. Social work is following the patient.   #2 UTI- Patient is still experiencing some dysuria. Urine culture demonstrates proteus growth. Patient has been receiving IV Ceftriaxone in the hospital. Will discontinue IV Ceftriaxone and begin oral Cefalexin suspension for 5 days to treat the patient's UTI.   #3 Dehydration- Pt's repeat BUN and Creatinine today improved after IV fluids.   #4 Alzheimer's Dementia- Continue home meds.  #5 HTN- Continue Carvedilol. Lasix has been held due to the patient's dehydration.   #6 HLD- Continue  Fenofibrate.  #7 Parkinson's Disease- Continue Carbidopa, Levodopa.  #8 Hypothyroidism- TSH WNL. Continue Levothyroxine.   Cultures:  Urine culture demonstrated proteus growth.  Telemetry: Patient is on cardiac monitoring.   DVT prophylaxis: Lovenox  Consultants:  PT, Speech Pathology, and Social Work following patient    Procedures:  None   Antimicrobials:  Code Status: DNR  Family Communication: No family was present.  Disposition Plan:   Anticipate discharge back to SNF with clinical improvement.   Objective: Vitals:   03/28/18 0400 03/28/18 0738 03/28/18 0740 03/28/18 1140  BP: (!) 144/92 (!) 230/187 92/66 100/60  Pulse: 67 71  67  Resp: (!) 25 (!) 21  16  Temp:  99.8 F (37.7 C)  98.1 F (36.7 C)  TempSrc:  Axillary  Oral  SpO2:  99%  97%  Weight:      Height:        Intake/Output Summary (Last 24 hours) at 03/28/2018 1416 Last data filed at 03/28/2018 0600 Gross per 24 hour  Intake 1085 ml  Output -  Net 1085 ml   Filed Weights   03/25/18 1229 03/26/18 0551  Weight: 90.7 kg 77.3 kg    Exam: Constitutional: normal appearing female. Lying in bed with hands mitted. Arouses to  Eyes: EOMI, anicteric, normal conjunctivae ENMT: Oropharynx with moist mucous membranes,  edentulous  Neck: FROM Cardiovascular: RRR no MRGs, with no peripheral edema Respiratory: Normal respiratory effort, clear breath sounds  Abdomen: Soft,non-tender, with no HSM Skin: No rash ulcers, or lesions. Without skin tenting  Neurologic: Tremor of bilateral hands noted. Tongue midline. Grossly no focal neuro deficit. Psychiatric:Appropriate affect, and mood. Mental status: Alert and oriented to self and time. Knows she is in [place], but is not clear whether or not she is aware she is in the hospital.   Data Reviewed: CBC: Recent Labs  Lab 03/25/18 1235 03/28/18 0531  WBC 9.9 10.7*  NEUTROABS 6.6  --   HGB 14.6 12.4  HCT 46.5* 38.5  MCV 86.9 87.5  PLT 280 197    Basic Metabolic Panel: Recent Labs  Lab 03/25/18 1235 03/25/18 1700 03/26/18 0356 03/28/18 0531  NA 136  --  137 139  K 4.4  --  4.2 4.1  CL 100  --  105 109  CO2 25  --  21* 22  GLUCOSE 116*  --  141* 133*  BUN 25*  --  27* 19  CREATININE 1.34*  --  1.34* 1.01*  CALCIUM 9.8  --  9.3 8.6*  MG  --  2.4  --   --    GFR: Estimated Creatinine Clearance: 49.9 mL/min (A) (by C-G formula based on SCr of 1.01 mg/dL (H)). Liver Function Tests: Recent Labs  Lab 03/25/18 1235  AST 20  ALT 5  ALKPHOS 103  BILITOT 0.7  PROT 8.6*  ALBUMIN 3.6   No results for input(s): LIPASE, AMYLASE in the last 168 hours. Recent Labs  Lab 03/26/18 0356  AMMONIA 17   Coagulation Profile: No results for input(s): INR, PROTIME in the last 168 hours. Cardiac Enzymes: No results for input(s): CKTOTAL, CKMB, CKMBINDEX, TROPONINI in the last 168 hours. BNP (last 3 results) No results for input(s): PROBNP in the last 8760 hours. HbA1C: No results for input(s): HGBA1C in the last 72 hours. CBG: No results for input(s): GLUCAP in the last 168 hours. Lipid Profile: No results for input(s): CHOL, HDL, LDLCALC, TRIG, CHOLHDL, LDLDIRECT in the last 72 hours. Thyroid Function Tests: Recent Labs    03/26/18 0356  TSH 0.986   Anemia Panel: Recent Labs    03/26/18 0356  VITAMINB12 566   Urine analysis:    Component Value Date/Time   COLORURINE YELLOW 03/25/2018 1700   APPEARANCEUR CLEAR 03/25/2018 1700   LABSPEC 1.012 03/25/2018 1700   PHURINE 7.0 03/25/2018 1700   GLUCOSEU NEGATIVE 03/25/2018 1700   HGBUR NEGATIVE 03/25/2018 1700   BILIRUBINUR NEGATIVE 03/25/2018 1700   KETONESUR NEGATIVE 03/25/2018 1700   PROTEINUR NEGATIVE 03/25/2018 1700   NITRITE NEGATIVE 03/25/2018 1700   LEUKOCYTESUR LARGE (A) 03/25/2018 1700   Sepsis Labs: @LABRCNTIP (procalcitonin:4,lacticidven:4)  ) Recent Results (from the past 240 hour(s))  Urine culture     Status: Abnormal   Collection Time:  03/25/18  5:00 PM  Result Value Ref Range Status   Specimen Description URINE, RANDOM  Final   Special Requests   Final    NONE Performed at [facility], [address] [place], [place] [address]    Culture >=100,000 COLONIES/mL PROTEUS MIRABILIS (A)  Final   Report Status 03/28/2018 FINAL  Final   Organism ID, Bacteria PROTEUS MIRABILIS (A)  Final      Susceptibility   Proteus mirabilis - MIC*    AMPICILLIN >=32 RESISTANT Resistant     CEFAZOLIN 8 SENSITIVE Sensitive     CEFTRIAXONE <=  1 SENSITIVE Sensitive     CIPROFLOXACIN >=4 RESISTANT Resistant     GENTAMICIN <=1 SENSITIVE Sensitive     IMIPENEM 8 INTERMEDIATE Intermediate     NITROFURANTOIN 128 RESISTANT Resistant     TRIMETH/SULFA >=320 RESISTANT Resistant     AMPICILLIN/SULBACTAM >=32 RESISTANT Resistant     PIP/TAZO <=4 SENSITIVE Sensitive     * >=100,000 COLONIES/mL PROTEUS MIRABILIS  MRSA PCR Screening     Status: None   Collection Time: 03/26/18  5:38 AM  Result Value Ref Range Status   MRSA by PCR NEGATIVE NEGATIVE Final    Comment:        The GeneXpert MRSA Assay (FDA approved for NASAL specimens only), is one component of a comprehensive MRSA colonization surveillance program. It is not intended to diagnose MRSA infection nor to guide or monitor treatment for MRSA infections. Performed at [facility], [address] [place], [place] [address]       Studies: No results found.  Scheduled Meds: . aspirin EC  81 mg Oral Daily  . carbidopa-levodopa  1 tablet Oral TID WC  . carvedilol  6.25 mg Oral BID WC  . cephALEXin  250 mg Oral Q6H  . enoxaparin (LOVENOX) injection  40 mg Subcutaneous Daily  . escitalopram  15 mg Oral Daily  . fenofibrate  54 mg Oral Daily  . hydrALAZINE  20 mg Intravenous Once  . lactobacillus acidophilus & bulgar  1 tablet Oral Daily  . levothyroxine  50 mcg Oral QAC breakfast  . OLANZapine  15 mg Oral QHS  . oxybutynin  10 mg Oral QHS  . pantoprazole  40 mg Oral  Daily  . rOPINIRole  0.5 mg Oral QHS  . temazepam  7.5 mg Oral QHS    Continuous Infusions: . sodium chloride 50 mL/hr at 03/28/18 1028     LOS: 1 day     Wilmon Armsatherine Betack, PA-S2 03/28/2018, 2:16 PM

## 2018-03-29 DIAGNOSIS — E782 Mixed hyperlipidemia: Secondary | ICD-10-CM

## 2018-03-29 NOTE — Progress Notes (Signed)
Physical Therapy Treatment Patient Details Name: Jessica Hayes MRN: [id number] DOB: 1945/05/05 Today's Date: 03/29/2018    History of Present Illness 73 y.o. female with medical history significant of Alzheimer's dementia, Parkinson's disease, anxiety, bipolar disorder, hypertension, hyperlipidemia, hypothyroidism being sent to the hospital from her nursing home for evaluation of stroke-like symptoms with some slurred speech and inability to walk per staff at facility    PT Comments    Pt performed transfer training with functional improvement.  She was able to utilize sara stedy to perform multiple transfers from varying surface heights with min-mod assistance.  Pt fatigued after having bowel movement so returned to recliner chair.  Pt participated in lower extremity strengthening.  Plan next session for progression to gait training.     Follow Up Recommendations  SNF     Equipment Recommendations  None recommended by PT    Recommendations for Other Services       Precautions / Restrictions Precautions Precautions: Fall Restrictions Weight Bearing Restrictions: No    Mobility  Bed Mobility Overal bed mobility: Needs Assistance Bed Mobility: Rolling;Sidelying to Sit Rolling: Min assist Sidelying to sit: Mod assist       General bed mobility comments: Pt required min assistance to roll to R side of bed.  Pt required assistance to advance B LEs to edge of bed and to elevate trunk into sitting.    Transfers Overall transfer level: Needs assistance Equipment used: Ambulation equipment used(sara stedy) Transfers: Sit to/from Stand Sit to Stand: +2 safety/equipment;Mod assist         General transfer comment: Pt performed from edge of elevated bed with min assistance and mod assistance from [facility].  Pt performed standing trials in stedy frame x2 ( 30 seconds roughly each trial.   Pt required cues for hand placement and assistance to boost into standing.    Ambulation/Gait Ambulation/Gait assistance: (NT)               Stairs             Wheelchair Mobility    Modified Rankin (Stroke Patients Only)       Balance Overall balance assessment: Needs assistance Sitting-balance support: Feet supported Sitting balance-Leahy Scale: Fair Sitting balance - Comments: leans posteriorly sitting EOB     Standing balance-Leahy Scale: Poor Standing balance comment: Pt with hip flexion and fatigues quickly in standing.  Heavily reliant on UEs.                              Cognition Arousal/Alertness: Awake/alert Behavior During Therapy: Flat affect Overall Cognitive Status: No family/caregiver present to determine baseline cognitive functioning                                 General Comments: history of dementia      Exercises General Exercises - Lower Extremity Ankle Circles/Pumps: AROM;Both;10 reps;Supine Long Arc Quad: AROM;Both;10 reps;Seated Heel Slides: AROM;AAROM;Both;10 reps;Supine Hip ABduction/ADduction: AROM;AAROM;Both;10 reps;Supine Hip Flexion/Marching: AROM;AAROM;Both;10 reps;Seated    General Comments        Pertinent Vitals/Pain Pain Assessment: No/denies pain    Home Living                      Prior Function            PT Goals (current goals can now be found in the care plan  section) Acute Rehab PT Goals Patient Stated Goal: To use the bathroom to have a bowel movement Potential to Achieve Goals: Fair Progress towards PT goals: Progressing toward goals    Frequency    Min 4X/week      PT Plan Current plan remains appropriate    Co-evaluation              AM-PAC PT "6 Clicks" Mobility   Outcome Measure  Help needed turning from your back to your side while in a flat bed without using bedrails?: A Little Help needed moving from lying on your back to sitting on the side of a flat bed without using bedrails?: A Lot Help needed moving to  and from a bed to a chair (including a wheelchair)?: A Lot Help needed standing up from a chair using your arms (e.g., wheelchair or bedside chair)?: A Lot Help needed to walk in hospital room?: A Lot Help needed climbing 3-5 steps with a railing? : Total 6 Click Score: 12    End of Session Equipment Utilized During Treatment: Gait belt Activity Tolerance: Patient tolerated treatment well Patient left: in bed;with bed alarm set;with call bell/phone within reach Nurse Communication: Mobility status;Other (comment) PT Visit Diagnosis: Other abnormalities of gait and mobility (R26.89)     Time: 5638-9373 PT Time Calculation (min) (ACUTE ONLY): 29 min  Charges:  $Therapeutic Exercise: 8-22 mins $Therapeutic Activity: 8-22 mins                     Joycelyn Rua, PTA Acute Rehabilitation Services Pager [phone] Office [phone]     Kinsey Cowsert Artis Delay 03/29/2018, 3:31 PM

## 2018-03-29 NOTE — Care Plan (Signed)
   Formal Capacity Evaluation.  1. Any Language or communication barriers  ( hearing/vision impairments, dysarthria, dysphagia, medical jargon/complicated explanantions)that interfere with the patient's understanding: NO 2. Are there any reversible causes of incapacity that are currently untreated (adverse medication effects, illiict drug use, hypoxia, metabolic abnormalities, acute neurologic/psychiatric disorders, delirium, or critical illness)?NO 3. Are there any values or culture specific to the patient that may cause questioning of capacity?NO  Directed Clinical Interview (Patients need to demonstrate a rational explanation in how they arrive at any given decision, if this evaluation shows the patient has adequate understanding/rationale, capacity has been established?  Patient's ability to understand treatment/care options  . What  is your understanding of your condition? . She can state she needs a pacemaker for her heart. She is able to state the cardiologist told her that her heart is subject to stopping and not starting back up again . What is your understading of the benefits of treatment versus risks of treatment? She thinks a pacemaker is good and can help people live a long time with a pacemaker. She states she's had her heart stopped like this before   . What is your understanding of what will happen if nothing is done? She thinks without it her heart could go faster and maybe die  Patient's appreciation of information that applies to his or her situation . What are the important factors to you in deciding your treatment? She's worried about the procedure because of her age and emotional prbolems . Do you trust your doctor, Why/why not? She does . What do you think will happen to you now?she thinks if it happens again with the pauses and her heart doesn't beat again she will die  Patient's ability to communicate and express a choice clearly . Have you decided what medical  option is best for you? (we have discussed several choices. What do you want to do?)She right now is against the pacemaker but would like to think over it a little more  Does the patient demonstrate clear capacity with above interview? YES   Jessica Hayes [facility]

## 2018-03-29 NOTE — Care Management Important Message (Signed)
Important Message  Patient Details  Name: Jessica Hayes MRN: [id number] Date of Birth: 10-02-1945   Medicare Important Message Given:  Yes  Due to illness patient not able to sign.  Unsigned copy left.   [name] 03/29/2018, 3:21 PM

## 2018-03-29 NOTE — Progress Notes (Signed)
CSW received call from [facility] asking about the patient's medical stability. CSW updated Admissions on patient's current medical status. [facility] remains ready to accept patient back when ready to transition out of the hospital.   Noting pending palliative consult; [facility] can set up either palliative or hospice services at SNF, depending on what patient and daughter discuss and would prefer to set up, if appropriate.   CSW to continue to follow.  Blenda Nicely, [place] Clinical Social Worker [phone]

## 2018-03-29 NOTE — Consult Note (Signed)
            [facility] CM Primary Care Navigator  03/29/2018  Jessica Hayes [birth year] [id number]   Went to see patient at the bedside to identify possible discharge needs but she is currently receiving treatment from therapy for now.  Per MD note, patient presented from her skilled nursing facility with reports of worsening weakness, slurred speech and confusion that is worse from baseline which is concerning for stroke-like symptoms and was found to have urinary tract infection.  According to Inpatient social worker, anticipating discharge back to skilled nursing facility (SNF- [facility]) with clinical improvement. Patient has been a resident of [facility] since March of last year.  Noted no [facility] Care Management needs identifiable at this point.    For additional questions please contact:  Karin Golden A. Khailee Mick, BSN, RN-BC [facility] PRIMARY CARE Navigator Cell: [phone]

## 2018-03-29 NOTE — Progress Notes (Signed)
Attending MD note  Patient was seen, examined,treatment plan was discussed with the PA-S.  I have personally reviewed the clinical findings, lab, imaging studies and management of this patient in detail. I agree with the documentation, as recorded by the PA-S    Physical: Vitals:   03/28/18 2314 03/29/18 0418  BP: (!) 116/59 (!) 121/58  Pulse: 84 84  Resp: 17 17  Temp: 98.4 F (36.9 C) 98.4 F (36.9 C)  SpO2: 97% 99%    Constitutional: Elderly female, no distress, lying in bed Eyes: EOMI, anicteric, normal conjunctivae ENMT: Oropharynx with moist mucous membranes, normal dentition Neck: FROM,  Cardiovascular: Irregularly irregular rhythm, no tachycardia with no peripheral edema Respiratory: Normal respiratory effort on room air, clear breath sounds  Abdomen: Soft,non-tender,  Skin: No rash ulcers, or lesions. Without skin tenting  Neurologic: Grossly no focal neuro deficit. Psychiatric: Flat affect. Mental status alert, oriented to self, place (city, not to hospital) not oriented to time or context  Plan  1. Persistent atrial fibrillation, new diagnosis,  Currently not in RVR heart rate has remained less than 110.  Previously on beta-blocker but has been discontinued due to hypotension and now sinus pauses. TSH wnl. Was having some chest discomfort possibly related to new arrhythmia, follow-up troponin, pending cardiology recommendations  2. Sinus pauses.  Unclear cause patient did receive last dose of carvedilol around 5 PM.  Possibly related to ongoing atrial fibrillation.  Holding further beta-blockers. Addendum: Cardiology consulted per patient declines any invasive interventions including temporary pacemaker or even permanent, discussed with cardiology she seemed aware of the risk and concern that this could lead to sinus arrest if it persists.  Cardiology tried to contact daughter with no success, I also tried to contact daughter Jessica Hayes back him at listed number of  [phone] with no success, left a message for her to call back to speak with me.  3. Hypotension, improving.  Blood pressure now in the low 100s systolic after LR bolus (improved from 80s to 90s).  Have discontinued all home blood pressure medications.  Following stat lactic acid to ensure perfusion  4. UTI secondary to Proteus.  Failed outpatient Bactrim.  Transition from IV Rocephin to Keflex.  Rest as below  Jessica Hayes  [facility]    PROGRESS NOTE  Jessica Hayes CXK:[id number] DOB: 1945-08-05 DOA: 03/25/2018 PCP: Jessica Floor., MD  HPI/Brief Narrative Jessica Hayes is a 73 y.o. year old female with medical history significant for Parkinson's dementia, depression, hypothyroidism, GERD, urinary incontinence who presented on 03/25/2018 from her skilled nursing facility with reports of worsening weakness, slurred speech and confusion worse from baseline concerning for strokelike symptoms and was found to have urinary tract infection.  Since hospitalization patient has been on IV Rocephin which was changed to Keflex based on urine culture data. Patient was noted to be in atrial fibrillation with intermittent hypotensive blood pressure (systolics 80s to 90s) requiring total of 1.5 L LR bolus.  Was also found to have multiple episodes of sinus pauses on telemetry with symptoms of decreased alertness/mentation and chest discomfort.  Given acute change in clinical status cardiology was consulted as well    Subjective Has continued to have intermittent episodes of sinus pauses Patient does remember talking with cardiology regarding pacemaker indication Would like me to speak with her daughter as well  Assessment/Plan:  Persistent atrial fibrillation, new diagnosis, remains rate controlled.  Not using any beta-blockers given intermittent sinus pauses and previous  hypotension.  Patient resting comfortably.    Sinus pauses.  Unclear cause patient did receive last dose  of carvedilol around 5 PM on 03/29/2018.  Cardiology saw patient on 1/9 and at that time patient declined temporary or permanent pacemaking.  Will attempt to discuss with daughter, will consult palliative care.  Hypotension, resolved.  Blood pressure now 120s over 50s.  We will continue to hold home BP medications and follow closely.  Lactic acid within normal limits.    UTI secondary to Proteus.  Failed outpatient Bactrim.  Transition from IV Rocephin to Keflex on 1/9 will continue for total 7 days.   HLD- Continue Fenofibrate.  Parkinson's Disease- Continue Carbidopa, Levodopa.   Hypothyroidism- TSH WNL. Continue Levothyroxine.   Cultures:  Urine culture demonstrated proteus growth.  Telemetry: Patient is on cardiac monitoring.   DVT prophylaxis: Lovenox  Consultants:  Cardiology, palliative care  Procedures:  None   Antimicrobials:  Code Status: DNR  Family Communication: No family was present.  Will call daughter  Disposition Plan:   Anticipate discharge back to SNF with clinical improvement, continue to closely monitor sinus pauses, need to discuss with daughter given patient's intermittent mental status, palliative care consultation.   Objective: Vitals:   03/28/18 1636 03/28/18 1924 03/28/18 2314 03/29/18 0418  BP: (!) 104/57 (!) 102/59 (!) 116/59 (!) 121/58  Pulse: 67 96 84 84  Resp:  (!) 23 17 17   Temp:  98.1 F (36.7 C) 98.4 F (36.9 C) 98.4 F (36.9 C)  TempSrc:  Oral Axillary Axillary  SpO2:  99% 97% 99%  Weight:      Height:        Intake/Output Summary (Last 24 hours) at 03/29/2018 1112 Last data filed at 03/29/2018 [id number] Gross per 24 hour  Intake 360 ml  Output -  Net 360 ml   Filed Weights   03/25/18 1229 03/26/18 0551  Weight: 90.7 kg 77.3 kg    Exam: Constitutional: normal appearing female. Lying in bed with hands mitted.  Eyes: EOMI, anicteric, normal conjunctivae ENMT: Oropharynx with moist mucous membranes, edentulous  Neck:  FROM Cardiovascular: Irregularly irregular rhythm, normal rate no MRGs, with no peripheral edema Respiratory: Normal respiratory effort, clear breath sounds  Abdomen: Soft,non-tender, normal bowel sounds Skin: No rash ulcers, or lesions. Without skin tenting  Neurologic: Resting tremor of bilateral hands noted. Tongue midline. Grossly no focal neuro deficit. Psychiatric:Appropriate affect, and mood. Mental status: Alert and oriented to self and time. Knows she is in [place], but is not clear whether or not she is aware she is in the hospital.   Data Reviewed: CBC: Recent Labs  Lab 03/25/18 1235 03/28/18 0531 03/28/18 1840  WBC 9.9 10.7* 6.4  NEUTROABS 6.6  --   --   HGB 14.6 12.4 12.1  HCT 46.5* 38.5 38.9  MCV 86.9 87.5 88.6  PLT 280 197 159   Basic Metabolic Panel: Recent Labs  Lab 03/25/18 1235 03/25/18 1700 03/26/18 0356 03/28/18 0531 03/28/18 1840  NA 136  --  137 139 139  K 4.4  --  4.2 4.1 3.6  CL 100  --  105 109 109  CO2 25  --  21* 22 23  GLUCOSE 116*  --  141* 133* 93  BUN 25*  --  27* 19 15  CREATININE 1.34*  --  1.34* 1.01* 0.85  CALCIUM 9.8  --  9.3 8.6* 8.4*  MG  --  2.4  --   --   --  GFR: Estimated Creatinine Clearance: 59.3 mL/min (by C-G formula based on SCr of 0.85 mg/dL). Liver Function Tests: Recent Labs  Lab 03/25/18 1235 03/28/18 1840  AST 20 17  ALT 5 5  ALKPHOS 103 62  BILITOT 0.7 0.7  PROT 8.6* 6.3*  ALBUMIN 3.6 2.3*   No results for input(s): LIPASE, AMYLASE in the last 168 hours. Recent Labs  Lab 03/26/18 0356  AMMONIA 17   Coagulation Profile: No results for input(s): INR, PROTIME in the last 168 hours. Cardiac Enzymes: Recent Labs  Lab 03/28/18 1840  TROPONINI <0.03   BNP (last 3 results) No results for input(s): PROBNP in the last 8760 hours. HbA1C: No results for input(s): HGBA1C in the last 72 hours. CBG: No results for input(s): GLUCAP in the last 168 hours. Lipid Profile: No results for input(s): CHOL,  HDL, LDLCALC, TRIG, CHOLHDL, LDLDIRECT in the last 72 hours. Thyroid Function Tests: No results for input(s): TSH, T4TOTAL, FREET4, T3FREE, THYROIDAB in the last 72 hours. Anemia Panel: No results for input(s): VITAMINB12, FOLATE, FERRITIN, TIBC, IRON, RETICCTPCT in the last 72 hours. Urine analysis:    Component Value Date/Time   COLORURINE YELLOW 03/25/2018 1700   APPEARANCEUR CLEAR 03/25/2018 1700   LABSPEC 1.012 03/25/2018 1700   PHURINE 7.0 03/25/2018 1700   GLUCOSEU NEGATIVE 03/25/2018 1700   HGBUR NEGATIVE 03/25/2018 1700   BILIRUBINUR NEGATIVE 03/25/2018 1700   KETONESUR NEGATIVE 03/25/2018 1700   PROTEINUR NEGATIVE 03/25/2018 1700   NITRITE NEGATIVE 03/25/2018 1700   LEUKOCYTESUR LARGE (A) 03/25/2018 1700   Sepsis Labs: @LABRCNTIP (procalcitonin:4,lacticidven:4)  ) Recent Results (from the past 240 hour(s))  Urine culture     Status: Abnormal   Collection Time: 03/25/18  5:00 PM  Result Value Ref Range Status   Specimen Description URINE, RANDOM  Final   Special Requests   Final    NONE Performed at [facility], [address] [place], [place] [address]    Culture >=100,000 COLONIES/mL PROTEUS MIRABILIS (A)  Final   Report Status 03/28/2018 FINAL  Final   Organism ID, Bacteria PROTEUS MIRABILIS (A)  Final      Susceptibility   Proteus mirabilis - MIC*    AMPICILLIN >=32 RESISTANT Resistant     CEFAZOLIN 8 SENSITIVE Sensitive     CEFTRIAXONE <=1 SENSITIVE Sensitive     CIPROFLOXACIN >=4 RESISTANT Resistant     GENTAMICIN <=1 SENSITIVE Sensitive     IMIPENEM 8 INTERMEDIATE Intermediate     NITROFURANTOIN 128 RESISTANT Resistant     TRIMETH/SULFA >=320 RESISTANT Resistant     AMPICILLIN/SULBACTAM >=32 RESISTANT Resistant     PIP/TAZO <=4 SENSITIVE Sensitive     * >=100,000 COLONIES/mL PROTEUS MIRABILIS  MRSA PCR Screening     Status: None   Collection Time: 03/26/18  5:38 AM  Result Value Ref Range Status   MRSA by PCR NEGATIVE NEGATIVE Final     Comment:        The GeneXpert MRSA Assay (FDA approved for NASAL specimens only), is one component of a comprehensive MRSA colonization surveillance program. It is not intended to diagnose MRSA infection nor to guide or monitor treatment for MRSA infections. Performed at [facility], [address] [place], [place] [address]       Studies: No results found.  Scheduled Meds: . aspirin EC  81 mg Oral Daily  . carbidopa-levodopa  1 tablet Oral TID WC  . cephALEXin  250 mg Oral Q6H  . enoxaparin (LOVENOX) injection  40  mg Subcutaneous Daily  . escitalopram  15 mg Oral Daily  . fenofibrate  54 mg Oral Daily  . hydrALAZINE  20 mg Intravenous Once  . lactobacillus acidophilus & bulgar  1 tablet Oral Daily  . levothyroxine  50 mcg Oral QAC breakfast  . OLANZapine  15 mg Oral QHS  . oxybutynin  10 mg Oral QHS  . pantoprazole  40 mg Oral Daily  . rOPINIRole  0.5 mg Oral QHS  . temazepam  7.5 mg Oral QHS    Continuous Infusions:    LOS: 2 days     Wilmon Arms, PA-S2 03/29/2018, 11:12 AM

## 2018-03-29 NOTE — Progress Notes (Signed)
  Speech Language Pathology Treatment: Dysphagia  Patient Details Name: Jessica Hayes MRN: [id number] DOB: [birth year] Today's Date: 03/29/2018 Time: 1200-1209 SLP Time Calculation (min) (ACUTE ONLY): 9 min  Assessment / Plan / Recommendation Clinical Impression  Partials still not present. Pt states daughter can bring however note read that social worker reached daughter today who was unaware pt in hospital. Pt able to [facility] a simulated level 2 minced texture with graham cracker in applesauce (Dys 2 will be drier than what was observed with). No indications of airway compromise with thin via straw or solids. Vitals have been stable; pt somewhat confused, requiring cues and understandably wanting to see daughter. Will upgrade texture to Dys 2, continue thin. Pt may need to remain on Dys 2 if partials do not arrive before discharge.     HPI HPI: 73 y.o. female with PMH: GERD, Alzheimer's dementia, Parkinson's disease, anxiety, bipolar disorder, hypertension, hyperlipidemia, hypothyroidism. MRI negative, CXR = mild BLL atelectasis      SLP Plan  Continue with current plan of care       Recommendations  Diet recommendations: Dysphagia 2 (fine chop);Thin liquid Liquids provided via: Cup;Straw Medication Administration: Whole meds with liquid Supervision: Patient able to self feed Compensations: Minimize environmental distractions;Slow rate;Small sips/bites Postural Changes and/or Swallow Maneuvers: Seated upright 90 degrees;Upright 30-60 min after meal                Oral Care Recommendations: Oral care BID Follow up Recommendations: Skilled Nursing facility SLP Visit Diagnosis: Dysphagia, unspecified (R13.10) Plan: Continue with current plan of care       GO                Royce Macadamia 03/29/2018, 12:15 PM

## 2018-03-29 NOTE — Progress Notes (Addendum)
CSW continuing to attempt to contact patient's daughter to complete assessment. Noting that MD has also attempted to contact daughter to discuss medical status. CSW left voicemail for daughter. If daughter continues to not be reachable, CSW can call a wellness check with [facility] [employer] to attempt to locate her at her address.  CSW to follow.  Blenda Nicely, [place] Clinical Social Worker [phone]   UPDATE 12:02 PM:  CSW received call back from patient's daughter Jessica Hayes who said she has poor cell reception where she's staying. Jessica Hayes had no idea her mom was at the hospital, says she'll be available to talk to the doctors. Jessica Hayes also provided other number for where she is currently staying in case she doesn't have cell service available when someone tries to call her. CSW updated contact information.  CSW to follow.  Blenda Nicely, [place] Clinical Social Worker [phone]

## 2018-03-30 DIAGNOSIS — Z7189 Other specified counseling: Secondary | ICD-10-CM

## 2018-03-30 DIAGNOSIS — I480 Paroxysmal atrial fibrillation: Secondary | ICD-10-CM

## 2018-03-30 DIAGNOSIS — Z515 Encounter for palliative care: Secondary | ICD-10-CM

## 2018-03-30 LAB — BASIC METABOLIC PANEL
ANION GAP: 8 (ref 5–15)
BUN: 12 mg/dL (ref 8–23)
CALCIUM: 8.9 mg/dL (ref 8.9–10.3)
CO2: 23 mmol/L (ref 22–32)
Chloride: 109 mmol/L (ref 98–111)
Creatinine, Ser: 0.81 mg/dL (ref 0.44–1.00)
GFR calc Af Amer: >60 mL/min (ref >60)
GFR calc non Af Amer: >60 mL/min (ref >60)
Glucose, Bld: 112 mg/dL — ABNORMAL HIGH (ref 70–99)
Potassium: 3.6 mmol/L (ref 3.5–5.1)
Sodium: 140 mmol/L (ref 135–145)

## 2018-03-30 LAB — CBC
HCT: 37.3 % (ref 36.0–46.0)
Hemoglobin: 11.7 g/dL — ABNORMAL LOW (ref 12.0–15.0)
MCH: 27.5 pg (ref 26.0–34.0)
MCHC: 31.4 g/dL (ref 30.0–36.0)
MCV: 87.6 fL (ref 80.0–100.0)
Platelets: 154 10*3/uL (ref 150–400)
RBC: 4.26 MIL/uL (ref 3.87–5.11)
RDW: 13.5 % (ref 11.5–15.5)
WBC: 4.4 10*3/uL (ref 4.0–10.5)
nRBC: 0 % (ref 0.0–0.2)

## 2018-03-30 NOTE — Consult Note (Signed)
Consultation Note Date: 03/30/2018   Patient Name: Jessica Hayes  DOB: Feb 02, 1946  MRN: [id number]  Age / Sex: 73 y.o., female  PCP: [name], Stephen D., MD Referring Physician: Laverna PeaceNettey, Shayla D, MD  Reason for Consultation: Establishing goals of care and Psychosocial/spiritual support  HPI/Patient Profile: 73 y.o. female  with past medical history of dementia, Parkinson's disease, bipolar disorder, hypertension, hyperlipidemia, recurrent UTI admitted on 03/25/2018 with altered mental status; strokelike symptoms.  Patient had recently been treated for urinary tract infection and was placed on Bactrim at the facility.  She was found to have another urinary tract infection.  She presented with altered mental status, weakness.  Chest x-ray was not suggestive of pneumonia and MRI of the brain did not reflect an acute event or significant atrophy  While in the hospital being treated for UTI, she was on telemetry and observed to have upwards of 10-second sinus pauses.  Consult ordered for goals of care specifically to address pacemaker.   Clinical Assessment and Goals of Care: Patient seen, chart reviewed.  Patient was able to accurately recall conversation regarding pacemaker with primary provider.  She understands that without it she could possibly have an acute cardiac arrest.  She denies feeling suicidal.  She states that she has become tired physically, emotionally with loss of independence, pain as well as poor concentration  She shares with me that she has 3 daughters.  When I asked her how her daughters might feel if she refused the pacemaker she stated "I do not think they would like that".  I then asked her would she be willing to go forward with a pacemaker if her children desired her to do so, and she said yes.  While we were speaking, her daughter, [name], came into the room.  She verified that she has  spoken to 1 of her other sisters, [name] and that they both would like to encourage their mother to go forward with the pacemaker.  Patient again tells me that she is willing to do that at this point.  Dr. Caleb PoppNettey was able to join us at this point and speak to her daughter [name], regarding risks and benefits.  Patient's answer regarding pacemaker is consistent with what she shared with Dr. Caleb PoppNettey this morning  Patient is able to speak for herself at this point.  It is felt that she has capacity.  She is able to appreciate the risks and benefits of pacemaker procedure and articulate those; she is showing consistency in her answers.  She does have 3 daughters: [name] at [phone] mobile, [phone]; daughter [name] (we do not have her number), and [name], daughter, [phone]    SUMMARY OF RECOMMENDATIONS   DNR Patient understands that her CODE STATUS would be reversed during the procedure but then would revert back to DNR Patient would like to go forward with pacemaker procedure Code Status/Advance Care Planning:  DNR    Palliative Prophylaxis:   Aspiration, Bowel Regimen, Delirium Protocol, Eye Care, Frequent Pain Assessment, Oral Care and  Turn Reposition   Psycho-social/Spiritual:   Desire for further Chaplaincy support:no  Additional Recommendations: Referral to Community Resources   Prognosis:   Unable to determine  Discharge Planning: Skilled Nursing Facility for rehab with Palliative care service follow-up      Primary Diagnoses: Present on Admission: . AMS (altered mental status) . Persistent atrial fibrillation . Sinus pause . Acute lower UTI   I have reviewed the medical record, interviewed the patient and family, and examined the patient. The following aspects are pertinent.  Past Medical History:  Diagnosis Date  . Alzheimer disease (HCC)   . Anxiety   . Bipolar disorder, unspecified (HCC)   . Dementia (HCC)   . Depressive disorder   .  GERD (gastroesophageal reflux disease)   . Hypercholesteremia   . Hypertension   . Insomnia   . Lumbar disc disease    degenerative  . Parkinson disease (HCC)   . Restless leg syndrome   . Thyroid disease    hypothyroid  . UTI (urinary tract infection)   . Weakness    Social History   Socioeconomic History  . Marital status: Widowed    Spouse name: Not on file  . Number of children: Not on file  . Years of education: Not on file  . Highest education level: Not on file  Occupational History  . Not on file  Social Needs  . Financial resource strain: Not on file  . Food insecurity:    Worry: Not on file    Inability: Not on file  . Transportation needs:    Medical: Not on file    Non-medical: Not on file  Tobacco Use  . Smoking status: Unknown If Ever Smoked  . Smokeless tobacco: Never Used  Substance and Sexual Activity  . Alcohol use: Not Currently  . Drug use: Not Currently  . Sexual activity: Not Currently  Lifestyle  . Physical activity:    Days per week: Not on file    Minutes per session: Not on file  . Stress: Not on file  Relationships  . Social connections:    Talks on phone: Not on file    Gets together: Not on file    Attends religious service: Not on file    Active member of club or organization: Not on file    Attends meetings of clubs or organizations: Not on file    Relationship status: Not on file  Other Topics Concern  . Not on file  Social History Narrative  . Not on file   History reviewed. No pertinent family history. Scheduled Meds: . aspirin EC  81 mg Oral Daily  . carbidopa-levodopa  1 tablet Oral TID WC  . cephALEXin  250 mg Oral Q6H  . enoxaparin (LOVENOX) injection  40 mg Subcutaneous Daily  . escitalopram  15 mg Oral Daily  . fenofibrate  54 mg Oral Daily  . hydrALAZINE  20 mg Intravenous Once  . lactobacillus acidophilus & bulgar  1 tablet Oral Daily  . levothyroxine  50 mcg Oral QAC breakfast  . OLANZapine  15 mg Oral QHS    . oxybutynin  10 mg Oral QHS  . pantoprazole  40 mg Oral Daily  . rOPINIRole  0.5 mg Oral QHS  . temazepam  7.5 mg Oral QHS   Continuous Infusions: PRN Meds:.acetaminophen **OR** acetaminophen, hydrALAZINE Medications Prior to Admission:  Prior to Admission medications   Medication Sig Start Date End Date Taking? Authorizing Provider  aspirin EC 81 MG tablet  Take 81 mg by mouth daily.   Yes [provider]  carbidopa-levodopa (SINEMET IR) 25-100 MG tablet Take 1 tablet by mouth 3 (three) times daily.   Yes [provider]  carvedilol (COREG) 6.25 MG tablet Take 6.25 mg by mouth 2 (two) times daily with a meal.   Yes [provider]  cyclobenzaprine (FLEXERIL) 5 MG tablet Take 5 mg by mouth at bedtime.   Yes [provider]  escitalopram (LEXAPRO) 10 MG tablet Take 15 mg by mouth daily.   Yes [provider]  fenofibrate (TRICOR) 48 MG tablet Take 48 mg by mouth daily.   Yes [provider]  furosemide (LASIX) 20 MG tablet Take 20 mg by mouth.   Yes [provider]  LACTOBACILLUS PO Take 1 capsule by mouth daily.   Yes [provider]  levothyroxine (SYNTHROID, LEVOTHROID) 50 MCG tablet Take 50 mcg by mouth daily before breakfast.   Yes [provider]  mirabegron ER (MYRBETRIQ) 50 MG TB24 tablet Take 50 mg by mouth daily.   Yes [provider]  OLANZapine (ZYPREXA) 15 MG tablet Take 15 mg by mouth at bedtime.   Yes [provider]  omeprazole (PRILOSEC) 20 MG capsule Take 20 mg by mouth every other day.   Yes [provider]  oxybutynin (DITROPAN-XL) 10 MG 24 hr tablet Take 10 mg by mouth at bedtime.   Yes [provider]  rOPINIRole (REQUIP) 0.5 MG tablet Take 0.5 mg by mouth at bedtime.   Yes [provider]  temazepam (RESTORIL) 7.5 MG capsule Take 7.5 mg by mouth at bedtime.   Yes [provider]  traMADol (ULTRAM) 50 MG tablet Take 50 mg by mouth  daily.   Yes [provider]   Allergies  Allergen Reactions  . Demerol [Meperidine Hcl] Other (See Comments)    Unknown per MAR   Review of Systems  Unable to perform ROS: Other    Physical Exam Vitals signs and nursing note reviewed.  Constitutional:      Appearance: Normal appearance.  HENT:     Head: Normocephalic and atraumatic.  Cardiovascular:     Rate and Rhythm: Rhythm irregular.  Pulmonary:     Effort: Pulmonary effort is normal.  Musculoskeletal: Normal range of motion.  Skin:    General: Skin is warm and dry.  Neurological:     General: No focal deficit present.     Mental Status: She is alert.  Psychiatric:        Behavior: Behavior normal.     Comments: Concentration poor, mild confusion, more short-term memory deficits Thought processes are linear and goal-directed     Vital Signs: BP 110/66 (BP Location: Right Arm)   Pulse 75   Temp 98.9 F (37.2 C) (Axillary)   Resp (!) 21   Ht 5\' 4"  (1.626 m)   Wt 77.3 kg   SpO2 98%   BMI 29.25 kg/m  Pain Scale: 0-10 POSS *See Group Information*: S-Acceptable,Sleep, easy to arouse Pain Score: 0-No pain   SpO2: SpO2: 98 % O2 Device:SpO2: 98 % O2 Flow Rate: .   IO: Intake/output summary:   Intake/Output Summary (Last 24 hours) at 03/30/2018 1442 Last data filed at 03/30/2018 0708 Gross per 24 hour  Intake 240 ml  Output 500 ml  Net -260 ml    LBM: Last BM Date: 03/29/18 Baseline Weight: Weight: 90.7 kg Most recent weight: Weight: 77.3 kg     Palliative Assessment/Data:   Flowsheet  Rows     Most Recent Value  Intake Tab  Referral Department  Hospitalist  Unit at Time of Referral  Med/Surg Unit  Palliative Care Primary Diagnosis  Sepsis/Infectious Disease  Date Notified  03/29/18  Palliative Care Type  New Palliative care  Date of Admission  03/27/18  Date first seen by Palliative Care  03/30/18  # of days Palliative referral response time  1 Day(s)  # of days IP prior to Palliative  referral  2  Clinical Assessment  Palliative Performance Scale Score  50%  Pain Max last 24 hours  Not able to report  Pain Min Last 24 hours  Not able to report  Dyspnea Max Last 24 Hours  Not able to report  Dyspnea Min Last 24 hours  Not able to report  Nausea Max Last 24 Hours  Not able to report  Nausea Min Last 24 Hours  Not able to report  Anxiety Max Last 24 Hours  Not able to report  Anxiety Min Last 24 Hours  Not able to report  Other Max Last 24 Hours  Not able to report  Psychosocial & Spiritual Assessment  Palliative Care Outcomes  Patient/Family meeting held?  Yes  Who was at the meeting?  pt, pt's dtr Annabelle Harman  Patient/Family wishes: Interventions discontinued/not started   Mechanical Ventilation  Palliative Care follow-up planned  No      Time In: 1330 Time Out: 1440 Time Total: 70 min Greater than 50%  of this time was spent counseling and coordinating care related to the above assessment and plan. Staffed with Dr. Atilano Median  Signed by: Irean Hong, NP   Please contact Palliative Medicine Team phone at [phone] for questions and concerns.  For individual provider: See Loretha Stapler

## 2018-03-30 NOTE — Progress Notes (Addendum)
PROGRESS NOTE  Jessica Hayes:[id number] DOB: Aug 10, 1945 DOA: 03/25/2018 PCP: Wilmer Floor., MD  HPI/Brief Narrative Jessica Hayes is a 73 y.o. year old female with medical history significant for Parkinson's dementia, depression, hypothyroidism, GERD, urinary incontinence who presented on 03/25/2018 from her skilled nursing facility with reports of worsening weakness, slurred speech and confusion worse from baseline concerning for strokelike symptoms and was found to have urinary tract infection.  Since hospitalization patient has been on IV Rocephin which was changed to Keflex based on urine culture data. Patient was noted to be in atrial fibrillation with intermittent hypotensive blood pressure (systolics 80s to 90s) requiring total of 1.5 L LR bolus.  Was also found to have multiple episodes of sinus pauses on telemetry with symptoms of decreased alertness/mentation and chest discomfort.  Given acute change in clinical status cardiology was consulted but at the time patient declined pacemaker ( temporary or permanent) as she did not want invasive/aggressive procedures    Subjective Has since spoken with her daughters and would now like to proceed with pacemaker and would reverse her code status during procedure.  No chest  pain, SOB, abdominal pain  Assessment/Plan:  Paroxysmal atrial fibrillation, spontaneously converted to normal sinus rhythm, new diagnosis, remains rate controlled.  Not using any beta-blockers given intermittent sinus pauses and previous hypotension. CHADS Vasc ( 1), currently on aspirin   Sinus pauses, stable.  Had several episodes of pauses lasting > 10 seconds while in atrial fibrillation.Unclear cause patient did receive carvedilol prior to episodes around 5 pm on 03/29/2018.  Cardiology last evaluated at that time and recommend pacemaker which she declined. No documented sinus pauses since discontinuing BB on telemetry, will discuss with cardiology now  that patient is amenable(after talking with daughters) to potential procedure  Hypotension, resolved.  Normotensive currently.  now 120s over 50s.  Holding home lasix and coreg, will follow closely.   UTI secondary to Proteus.  Failed outpatient Bactrim.  Transition from IV Rocephin to Keflex on 1/9 will continue for total 7 days.   HLD- Continue Fenofibrate.  Parkinson's Disease- Continue Carbidopa, Levodopa.   Hypothyroidism- TSH WNL. Continue Levothyroxine.   Cultures:  Urine culture demonstrated proteus growth.  Telemetry: Patient is on cardiac monitoring.   DVT prophylaxis: Lovenox  Consultants:  Cardiology, palliative care  Procedures:  None   Antimicrobials:  Code Status: DNR  Family Communication: Spoke with daughter dana at bedside  Disposition Plan:   Anticipate discharge back to SNF with clinical improvement, continue to closely monitor sinus pauses, need to discuss with daughter given patient's intermittent mental status, palliative care consultation.   Objective: Vitals:   03/30/18 0009 03/30/18 0430 03/30/18 0728 03/30/18 1210  BP: 119/61 114/90 131/75 110/66  Pulse:  75    Resp: 14  (!) 21   Temp: 97.8 F (36.6 C) 97.8 F (36.6 C) 98.9 F (37.2 C) 98.9 F (37.2 C)  TempSrc:  Oral Oral Axillary  SpO2: 98% 98% 98% 98%  Weight:      Height:        Intake/Output Summary (Last 24 hours) at 03/30/2018 1238 Last data filed at 03/30/2018 0708 Gross per 24 hour  Intake 240 ml  Output 500 ml  Net -260 ml   Filed Weights   03/25/18 1229 03/26/18 0551  Weight: 90.7 kg 77.3 kg    Exam: Constitutional: normal appearing female. Lying in bed with hands mitted.  Eyes: EOMI, anicteric, normal conjunctivae ENMT: Oropharynx with moist mucous  membranes, edentulous  Neck: FROM Cardiovascular:NSR no MRGs, with no peripheral edema Respiratory: Normal respiratory effort, clear breath sounds  Abdomen: Soft,non-tender, normal bowel sounds Skin: No rash  ulcers, or lesions. Without skin tenting  Neurologic: Resting tremor of bilateral hands noted. Tongue midline. Grossly no focal neuro deficit. Psychiatric:Appropriate affect, and mood. Mental status: Alert and oriented to self and time. Knows she is in [place], but is not clear whether or not she is aware she is in the hospital.   Data Reviewed: CBC: Recent Labs  Lab 03/25/18 1235 03/28/18 0531 03/28/18 1840 03/30/18 0619  WBC 9.9 10.7* 6.4 4.4  NEUTROABS 6.6  --   --   --   HGB 14.6 12.4 12.1 11.7*  HCT 46.5* 38.5 38.9 37.3  MCV 86.9 87.5 88.6 87.6  PLT 280 197 159 154   Basic Metabolic Panel: Recent Labs  Lab 03/25/18 1235 03/25/18 1700 03/26/18 0356 03/28/18 0531 03/28/18 1840 03/30/18 0619  NA 136  --  137 139 139 140  K 4.4  --  4.2 4.1 3.6 3.6  CL 100  --  105 109 109 109  CO2 25  --  21* 22 23 23   GLUCOSE 116*  --  141* 133* 93 112*  BUN 25*  --  27* 19 15 12   CREATININE 1.34*  --  1.34* 1.01* 0.85 0.81  CALCIUM 9.8  --  9.3 8.6* 8.4* 8.9  MG  --  2.4  --   --   --   --    GFR: Estimated Creatinine Clearance: 62.2 mL/min (by C-G formula based on SCr of 0.81 mg/dL). Liver Function Tests: Recent Labs  Lab 03/25/18 1235 03/28/18 1840  AST 20 17  ALT 5 5  ALKPHOS 103 62  BILITOT 0.7 0.7  PROT 8.6* 6.3*  ALBUMIN 3.6 2.3*   No results for input(s): LIPASE, AMYLASE in the last 168 hours. Recent Labs  Lab 03/26/18 0356  AMMONIA 17   Coagulation Profile: No results for input(s): INR, PROTIME in the last 168 hours. Cardiac Enzymes: Recent Labs  Lab 03/28/18 1840  TROPONINI <0.03   BNP (last 3 results) No results for input(s): PROBNP in the last 8760 hours. HbA1C: No results for input(s): HGBA1C in the last 72 hours. CBG: No results for input(s): GLUCAP in the last 168 hours. Lipid Profile: No results for input(s): CHOL, HDL, LDLCALC, TRIG, CHOLHDL, LDLDIRECT in the last 72 hours. Thyroid Function Tests: No results for input(s): TSH, T4TOTAL,  FREET4, T3FREE, THYROIDAB in the last 72 hours. Anemia Panel: No results for input(s): VITAMINB12, FOLATE, FERRITIN, TIBC, IRON, RETICCTPCT in the last 72 hours. Urine analysis:    Component Value Date/Time   COLORURINE YELLOW 03/25/2018 1700   APPEARANCEUR CLEAR 03/25/2018 1700   LABSPEC 1.012 03/25/2018 1700   PHURINE 7.0 03/25/2018 1700   GLUCOSEU NEGATIVE 03/25/2018 1700   HGBUR NEGATIVE 03/25/2018 1700   BILIRUBINUR NEGATIVE 03/25/2018 1700   KETONESUR NEGATIVE 03/25/2018 1700   PROTEINUR NEGATIVE 03/25/2018 1700   NITRITE NEGATIVE 03/25/2018 1700   LEUKOCYTESUR LARGE (A) 03/25/2018 1700   Sepsis Labs: @LABRCNTIP (procalcitonin:4,lacticidven:4)  ) Recent Results (from the past 240 hour(s))  Urine culture     Status: Abnormal   Collection Time: 03/25/18  5:00 PM  Result Value Ref Range Status   Specimen Description URINE, RANDOM  Final   Special Requests   Final    NONE Performed at [facility], [address] [place], [place] [address]    Culture >=100,000  COLONIES/mL PROTEUS MIRABILIS (A)  Final   Report Status 03/28/2018 FINAL  Final   Organism ID, Bacteria PROTEUS MIRABILIS (A)  Final      Susceptibility   Proteus mirabilis - MIC*    AMPICILLIN >=32 RESISTANT Resistant     CEFAZOLIN 8 SENSITIVE Sensitive     CEFTRIAXONE <=1 SENSITIVE Sensitive     CIPROFLOXACIN >=4 RESISTANT Resistant     GENTAMICIN <=1 SENSITIVE Sensitive     IMIPENEM 8 INTERMEDIATE Intermediate     NITROFURANTOIN 128 RESISTANT Resistant     TRIMETH/SULFA >=320 RESISTANT Resistant     AMPICILLIN/SULBACTAM >=32 RESISTANT Resistant     PIP/TAZO <=4 SENSITIVE Sensitive     * >=100,000 COLONIES/mL PROTEUS MIRABILIS  MRSA PCR Screening     Status: None   Collection Time: 03/26/18  5:38 AM  Result Value Ref Range Status   MRSA by PCR NEGATIVE NEGATIVE Final    Comment:        The GeneXpert MRSA Assay (FDA approved for NASAL specimens only), is one component of a comprehensive MRSA  colonization surveillance program. It is not intended to diagnose MRSA infection nor to guide or monitor treatment for MRSA infections. Performed at [facility], [address] [place], [place] [address]       Studies: No results found.  Scheduled Meds: . aspirin EC  81 mg Oral Daily  . carbidopa-levodopa  1 tablet Oral TID WC  . cephALEXin  250 mg Oral Q6H  . enoxaparin (LOVENOX) injection  40 mg Subcutaneous Daily  . escitalopram  15 mg Oral Daily  . fenofibrate  54 mg Oral Daily  . hydrALAZINE  20 mg Intravenous Once  . lactobacillus acidophilus & bulgar  1 tablet Oral Daily  . levothyroxine  50 mcg Oral QAC breakfast  . OLANZapine  15 mg Oral QHS  . oxybutynin  10 mg Oral QHS  . pantoprazole  40 mg Oral Daily  . rOPINIRole  0.5 mg Oral QHS  . temazepam  7.5 mg Oral QHS    Continuous Infusions:    LOS: 3 days     Wilmon Armsatherine Betack, PA-S2 03/30/2018, 12:38 PM

## 2018-03-31 DIAGNOSIS — R7989 Other specified abnormal findings of blood chemistry: Secondary | ICD-10-CM

## 2018-03-31 DIAGNOSIS — R29898 Other symptoms and signs involving the musculoskeletal system: Secondary | ICD-10-CM

## 2018-03-31 DIAGNOSIS — Z7189 Other specified counseling: Secondary | ICD-10-CM

## 2018-03-31 DIAGNOSIS — E860 Dehydration: Secondary | ICD-10-CM

## 2018-03-31 MED ORDER — DIPHENHYDRAMINE HCL 50 MG/ML IJ SOLN
INTRAMUSCULAR | Status: AC
  Filled 2018-03-31: qty 1

## 2018-03-31 MED ORDER — FUROSEMIDE 20 MG PO TABS: 20.0000 mg | ORAL_TABLET | ORAL | Status: AC | PRN

## 2018-03-31 MED ORDER — CEPHALEXIN 250 MG PO CAPS: 250.0000 mg | ORAL_CAPSULE | Freq: Four times a day (QID) | ORAL | 0 refills | Status: AC

## 2018-03-31 NOTE — Plan of Care (Signed)
Patient stable, discussed POC with patient, agreeable with plan, denies question/concerns at this time. Report given to SNF.

## 2018-03-31 NOTE — Progress Notes (Signed)
Patient will DC to: [facility] Anticipated DC date: 03/31/2018 Family notified: Yes Transport by: Sharin Mons   Will be going to room 103A.   Per MD patient ready for DC to . RN, patient, patient's family, and facility notified of DC. Discharge Summary and FL2 sent to facility. RN to call report prior to discharge [phone]). DC packet on chart. Ambulance transport requested for patient.   CSW will sign off for now as social work intervention is no longer needed. Please consult [place] again if new needs arise.  Nasim Habeeb, LCSW-A [place]/Clinical Social Work Department Cell: [phone]

## 2018-03-31 NOTE — Discharge Summary (Addendum)
Discharge Summary  Jessica Hayes OIB:[id number] DOB: 06/23/1945  PCP: Wilmer Floor., MD  Admit date: 03/25/2018 Discharge date: 03/31/2018   Time spent: >35 minutes  Admitted From: SNF Disposition: SNF  Recommendations for Outpatient Follow-up:  1. Follow up with SNF PCP in 1 week 2. New medications changes: Discontinued carvedilol, Lasix as needed edema, Keflex (end date 04/02/2018) 3.     Discharge Diagnoses:  Active Hospital Problems   Diagnosis Date Noted  . AMS (altered mental status) 03/25/2018  . Goals of care, counseling/discussion   . Palliative care by specialist   . Persistent atrial fibrillation 03/28/2018  . Sinus pause 03/28/2018  . Acute lower UTI 03/28/2018  . Dehydration 03/26/2018  . Alzheimer's dementia (HCC) 03/26/2018  . Essential hypertension 03/26/2018  . Hyperlipidemia 03/26/2018  . Hypothyroidism 03/26/2018    Resolved Hospital Problems  No resolved problems to display.    Discharge Condition: Stable  CODE STATUS: DNR Diet recommendation:    History of present illness:  Jessica Hayes is a 73 y.o. year old female with medical history significant for Parkinson's dementia, depression, hypothyroidism, GERD, urinary incontinence  who presented on 03/25/2018 with acute change in mental status with worsening weakness and aphasia and was found to have Proteus UTI, paroxysmal atrial fibrillation and sinus pauses. Remaining hospital course addressed in problem based format below:   Hospital Course:   Paroxysmal atrial fibrillation, spontaneously converted to normal sinus rhythm, new diagnosis, Went into atrial fibrillation during hospitalization.  Remained rate controlled throughout and then spontaneously converted to normal sinus rhythm 2 days prior to discharge.  On discharge maintain normal sinus rhythm..  Patient is unable to use beta-blockers given significant sinus pauses.  CHADS Vasc ( 1), currently on aspirin   Sinus pauses, resolved.    Had several episodes of sinus pauses pauses lasting > 10 seconds while in atrial fibrillation on 1/9.Unclear cause patient did receive carvedilol prior to episodes around 5 pm on 03/28/2018.  Cardiology evaluated at that time and recommend pacemaker which she declined.  Palliative was brought on board to pursue more comfort care measures however in discussion with patient and her family they elected to proceed with pacemaker still required.  Case was again discussed with cardiology but patient has since converted to normal sinus rhythm and had no further episodes of sinus pauses in the remaining 48 hours so no longer an indication for pacemaker placement.  It was recommended that she stay away from any AV nodal blocking agents so she will not continue carvedilol on discharge.  Altered mental status with aphasia and generalized weakness, resolved Head CT was negative for acute findings, neuro exam remained nonfocal.  Patient speech improved significantly during hospital stay.  Presume this was acute metabolic encephalopathy related to UTI.  Patient has improved with treatment of UTI.  And other work-up for confusion B12, TSH, ammonia, brain MRI all within normal limits  AKI, prerenal etiology Creatinine improved with IV fluids, on discharge creatinine 0.8.  Hypotension, resolved.    Occurred in the setting of atrial fibrillation with frequent sinus pauses as mentioned above.  Since discontinue carvedilol patient has maintained normal blood pressure.   UTI secondary to Proteus.  Failed outpatient Bactrim.    Treated empirically with IV Rocephin before transitioning to Keflex on 1/9, will continue with end date of 1/14 (total 7 days of treatment)    HLD- Continue Fenofibrate.  Parkinson's Disease- Continue Carbidopa, Levodopa, Zyprexa  Depression, stable Continue home Lexapro  GERD, stable Continue  PPI  Hypothyroidism, stable TSH within normal limits.  Continue home  Synthroid.    Consultations:  Cardiology, palliative  Procedures/Studies: None  Discharge Exam: BP (!) 125/99 (BP Location: Right Arm)   Pulse 78   Temp 98.3 F (36.8 C) (Oral)   Resp 19   Ht 5\' 4"  (1.626 m)   Wt 77.3 kg   SpO2 99%   BMI 29.25 kg/m   General: Lying in bed, no apparent distress Eyes: EOMI, anicteric ENT: Oral Mucosa clear and moist Cardiovascular: regular rate and rhythm, no murmurs, rubs or gallops, no edema, Respiratory: Normal respiratory effort on room air, lungs clear to auscultation bilaterally Abdomen: soft, non-distended, non-tender, normal bowel sounds Skin: No Rash Neurologic: Grossly no focal neuro deficit.alert, oriented to place (able to say Jessica Hayes Jessica Hayes), self, context, time Psychiatric:Appropriate affect, and mood   Discharge Instructions You were cared for by a hospitalist during your hospital stay. If you have any questions about your discharge medications or the care you received while you were in the hospital after you are discharged, you can call the unit and asked to speak with the hospitalist on call if the hospitalist that took care of you is not available. Once you are discharged, your primary care physician will handle any further medical issues. Please note that NO REFILLS for any discharge medications will be authorized once you are discharged, as it is imperative that you return to your primary care physician (or establish a relationship with a primary care physician if you do not have one) for your aftercare needs so that they can reassess your need for medications and monitor your lab values.  Discharge Instructions    Diet - low sodium heart healthy   Complete by:  As directed    Increase activity slowly   Complete by:  As directed      Allergies as of 03/31/2018      Reactions   Demerol [meperidine Hcl] Other (See Comments)   [facility]      Medication List    STOP taking these medications    carvedilol 6.25 MG tablet Commonly known as:  COREG   sulfamethoxazole-trimethoprim 800-160 MG tablet Commonly known as:  BACTRIM DS,SEPTRA DS     TAKE these medications   aspirin EC 81 MG tablet Take 81 mg by mouth daily.   carbidopa-levodopa 25-100 MG tablet Commonly known as:  SINEMET IR Take 1 tablet by mouth 3 (three) times daily.   cephALEXin 250 MG capsule Commonly known as:  KEFLEX Take 1 capsule (250 mg total) by mouth every 6 (six) hours for 8 doses.   cyclobenzaprine 5 MG tablet Commonly known as:  FLEXERIL Take 5 mg by mouth at bedtime.   escitalopram 10 MG tablet Commonly known as:  LEXAPRO Take 15 mg by mouth daily.   fenofibrate 48 MG tablet Commonly known as:  TRICOR Take 48 mg by mouth daily.   furosemide 20 MG tablet Commonly known as:  LASIX Take 1 tablet (20 mg total) by mouth as needed for edema. What changed:    when to take this  reasons to take this   LACTOBACILLUS PO Take 1 capsule by mouth daily.   levothyroxine 50 MCG tablet Commonly known as:  SYNTHROID, LEVOTHROID Take 50 mcg by mouth daily before breakfast.   MYRBETRIQ 50 MG Tb24 tablet Generic drug:  mirabegron ER Take 50 mg by mouth daily.   OLANZapine 15 MG tablet Commonly known as:  ZYPREXA Take 15 mg  by mouth at bedtime.   omeprazole 20 MG capsule Commonly known as:  PRILOSEC Take 20 mg by mouth every other day.   oxybutynin 10 MG 24 hr tablet Commonly known as:  DITROPAN-XL Take 10 mg by mouth at bedtime.   rOPINIRole 0.5 MG tablet Commonly known as:  REQUIP Take 0.5 mg by mouth at bedtime.   temazepam 7.5 MG capsule Commonly known as:  RESTORIL Take 7.5 mg by mouth at bedtime.   traMADol 50 MG tablet Commonly known as:  ULTRAM Take 50 mg by mouth daily.      Allergies  Allergen Reactions  . Demerol [Meperidine Hcl] Other (See Comments)    Unknown per [facility]   Contact information for after-discharge care    Destination    [facility]  SNF .   Service:  Skilled Nursing Contact information: [address] [place] [address] [phone]               The results of significant diagnostics from this hospitalization (including imaging, microbiology, ancillary and laboratory) are listed below for reference.    Significant Diagnostic Studies: Dg Chest 2 View  Result Date: 03/25/2018 CLINICAL DATA:  Altered mental status EXAM: CHEST - 2 VIEW COMPARISON:  None. FINDINGS: Heart size and vascularity normal. Mild bibasilar atelectasis. Negative for effusion or mass. No acute skeletal abnormality. IMPRESSION: Mild bibasilar atelectasis. Electronically Signed   By: Marlan Palau M.D.   On: 03/25/2018 13:42   Ct Head Wo Contrast  Result Date: 03/25/2018 CLINICAL DATA:  Altered level of consciousness. EXAM: CT HEAD WITHOUT CONTRAST TECHNIQUE: Contiguous axial images were obtained from the base of the skull through the vertex without intravenous contrast. COMPARISON:  None. FINDINGS: Brain: No evidence of acute infarction, hemorrhage, hydrocephalus, extra-axial collection or mass lesion/mass effect. There is mild diffuse low-attenuation within the subcortical and periventricular white matter compatible with chronic microvascular disease. Vascular: No hyperdense vessel or unexpected calcification. Skull: Normal. Negative for fracture or focal lesion. Sinuses/Orbits: No acute finding. Other: None IMPRESSION: 1. No acute intracranial abnormalities. 2. Chronic small vessel ischemic change. Electronically Signed   By: Signa Kell M.D.   On: 03/25/2018 14:42   Mr Brain Wo Contrast  Result Date: 03/26/2018 CLINICAL DATA:  Focal neuro deficit.  Altered mental status. EXAM: MRI HEAD WITHOUT CONTRAST TECHNIQUE: Multiplanar, multiecho pulse sequences of the brain and surrounding structures were obtained without intravenous contrast. COMPARISON:  Head CT from yesterday FINDINGS: Brain: No acute infarction, hemorrhage,  hydrocephalus, extra-axial collection or mass lesion. History of dementia but no notable atrophy. Minor periventricular chronic small vessel ischemia. Vascular: Major flow voids are preserved Skull and upper cervical spine: Negative for marrow lesion. Sinuses/Orbits: Negative Other: Significant and progressive motion degradation. IMPRESSION: Significantly motion degraded study that is otherwise unremarkable for age. Electronically Signed   By: Marnee Spring M.D.   On: 03/26/2018 05:00    Microbiology: Recent Results (from the past 240 hour(s))  Urine culture     Status: Abnormal   Collection Time: 03/25/18  5:00 PM  Result Value Ref Range Status   Specimen Description URINE, RANDOM  Final   Special Requests   Final    NONE Performed at [facility], [address] [place], [place] [address]    Culture >=100,000 COLONIES/mL PROTEUS MIRABILIS (A)  Final   Report Status 03/28/2018 FINAL  Final   Organism ID, Bacteria PROTEUS MIRABILIS (A)  Final      Susceptibility   Proteus mirabilis -  MIC*    AMPICILLIN >=32 RESISTANT Resistant     CEFAZOLIN 8 SENSITIVE Sensitive     CEFTRIAXONE <=1 SENSITIVE Sensitive     CIPROFLOXACIN >=4 RESISTANT Resistant     GENTAMICIN <=1 SENSITIVE Sensitive     IMIPENEM 8 INTERMEDIATE Intermediate     NITROFURANTOIN 128 RESISTANT Resistant     TRIMETH/SULFA >=320 RESISTANT Resistant     AMPICILLIN/SULBACTAM >=32 RESISTANT Resistant     PIP/TAZO <=4 SENSITIVE Sensitive     * >=100,000 COLONIES/mL PROTEUS MIRABILIS  MRSA PCR Screening     Status: None   Collection Time: 03/26/18  5:38 AM  Result Value Ref Range Status   MRSA by PCR NEGATIVE NEGATIVE Final    Comment:        The GeneXpert MRSA Assay (FDA approved for NASAL specimens only), is one component of a comprehensive MRSA colonization surveillance program. It is not intended to diagnose MRSA infection nor to guide or monitor treatment for MRSA infections. Performed at [facility], [address] [place], [place] [address]      Labs: Basic Metabolic Panel: Recent Labs  Lab 03/25/18 1235 03/25/18 1700 03/26/18 0356 03/28/18 0531 03/28/18 1840 03/30/18 0619  NA 136  --  137 139 139 140  K 4.4  --  4.2 4.1 3.6 3.6  CL 100  --  105 109 109 109  CO2 25  --  21* 22 23 23   GLUCOSE 116*  --  141* 133* 93 112*  BUN 25*  --  27* 19 15 12   CREATININE 1.34*  --  1.34* 1.01* 0.85 0.81  CALCIUM 9.8  --  9.3 8.6* 8.4* 8.9  MG  --  2.4  --   --   --   --    Liver Function Tests: Recent Labs  Lab 03/25/18 1235 03/28/18 1840  AST 20 17  ALT 5 5  ALKPHOS 103 62  BILITOT 0.7 0.7  PROT 8.6* 6.3*  ALBUMIN 3.6 2.3*   No results for input(s): LIPASE, AMYLASE in the last 168 hours. Recent Labs  Lab 03/26/18 0356  AMMONIA 17   CBC: Recent Labs  Lab 03/25/18 1235 03/28/18 0531 03/28/18 1840 03/30/18 0619  WBC 9.9 10.7* 6.4 4.4  NEUTROABS 6.6  --   --   --   HGB 14.6 12.4 12.1 11.7*  HCT 46.5* 38.5 38.9 37.3  MCV 86.9 87.5 88.6 87.6  PLT 280 197 159 154   Cardiac Enzymes: Recent Labs  Lab 03/28/18 1840  TROPONINI <0.03   BNP: BNP (last 3 results) No results for input(s): BNP in the last 8760 hours.  ProBNP (last 3 results) No results for input(s): PROBNP in the last 8760 hours.  CBG: No results for input(s): GLUCAP in the last 168 hours.     Signed:  Laverna PeaceShayla D Nettey, MD [facility] 03/31/2018, 1:06 PM

## 2018-04-01 DIAGNOSIS — I4819 Other persistent atrial fibrillation: Secondary | ICD-10-CM | POA: Diagnosis not present

## 2018-04-01 DIAGNOSIS — F0280 Dementia in other diseases classified elsewhere without behavioral disturbance: Secondary | ICD-10-CM | POA: Diagnosis not present

## 2018-04-01 DIAGNOSIS — M6281 Muscle weakness (generalized): Secondary | ICD-10-CM | POA: Diagnosis not present

## 2018-04-01 DIAGNOSIS — G20 Parkinson's disease: Secondary | ICD-10-CM | POA: Diagnosis not present

## 2018-04-01 DIAGNOSIS — R2689 Other abnormalities of gait and mobility: Secondary | ICD-10-CM | POA: Diagnosis not present

## 2018-04-01 DIAGNOSIS — R41841 Cognitive communication deficit: Secondary | ICD-10-CM | POA: Diagnosis not present

## 2018-04-01 DIAGNOSIS — N390 Urinary tract infection, site not specified: Secondary | ICD-10-CM | POA: Diagnosis not present

## 2018-04-01 DIAGNOSIS — F319 Bipolar disorder, unspecified: Secondary | ICD-10-CM | POA: Diagnosis not present

## 2018-04-01 DIAGNOSIS — G300 Alzheimer's disease with early onset: Secondary | ICD-10-CM | POA: Diagnosis not present

## 2018-04-02 DIAGNOSIS — R2689 Other abnormalities of gait and mobility: Secondary | ICD-10-CM | POA: Diagnosis not present

## 2018-04-02 DIAGNOSIS — F0280 Dementia in other diseases classified elsewhere without behavioral disturbance: Secondary | ICD-10-CM | POA: Diagnosis not present

## 2018-04-02 DIAGNOSIS — G20 Parkinson's disease: Secondary | ICD-10-CM | POA: Diagnosis not present

## 2018-04-02 DIAGNOSIS — F329 Major depressive disorder, single episode, unspecified: Secondary | ICD-10-CM | POA: Diagnosis not present

## 2018-04-02 DIAGNOSIS — N390 Urinary tract infection, site not specified: Secondary | ICD-10-CM | POA: Diagnosis not present

## 2018-04-02 DIAGNOSIS — R41841 Cognitive communication deficit: Secondary | ICD-10-CM | POA: Diagnosis not present

## 2018-04-02 DIAGNOSIS — G300 Alzheimer's disease with early onset: Secondary | ICD-10-CM | POA: Diagnosis not present

## 2018-04-02 DIAGNOSIS — I455 Other specified heart block: Secondary | ICD-10-CM | POA: Diagnosis not present

## 2018-04-02 DIAGNOSIS — F419 Anxiety disorder, unspecified: Secondary | ICD-10-CM | POA: Diagnosis not present

## 2018-04-02 DIAGNOSIS — I4819 Other persistent atrial fibrillation: Secondary | ICD-10-CM | POA: Diagnosis not present

## 2018-04-02 DIAGNOSIS — M6281 Muscle weakness (generalized): Secondary | ICD-10-CM | POA: Diagnosis not present

## 2018-04-02 DIAGNOSIS — I480 Paroxysmal atrial fibrillation: Secondary | ICD-10-CM | POA: Diagnosis not present

## 2018-04-02 DIAGNOSIS — F319 Bipolar disorder, unspecified: Secondary | ICD-10-CM | POA: Diagnosis not present

## 2018-04-02 DIAGNOSIS — B964 Proteus (mirabilis) (morganii) as the cause of diseases classified elsewhere: Secondary | ICD-10-CM | POA: Diagnosis not present

## 2018-04-03 DIAGNOSIS — I4819 Other persistent atrial fibrillation: Secondary | ICD-10-CM | POA: Diagnosis not present

## 2018-04-03 DIAGNOSIS — M6281 Muscle weakness (generalized): Secondary | ICD-10-CM | POA: Diagnosis not present

## 2018-04-03 DIAGNOSIS — G20 Parkinson's disease: Secondary | ICD-10-CM | POA: Diagnosis not present

## 2018-04-03 DIAGNOSIS — N390 Urinary tract infection, site not specified: Secondary | ICD-10-CM | POA: Diagnosis not present

## 2018-04-03 DIAGNOSIS — F0280 Dementia in other diseases classified elsewhere without behavioral disturbance: Secondary | ICD-10-CM | POA: Diagnosis not present

## 2018-04-03 DIAGNOSIS — R2689 Other abnormalities of gait and mobility: Secondary | ICD-10-CM | POA: Diagnosis not present

## 2018-04-03 DIAGNOSIS — F319 Bipolar disorder, unspecified: Secondary | ICD-10-CM | POA: Diagnosis not present

## 2018-04-03 DIAGNOSIS — G300 Alzheimer's disease with early onset: Secondary | ICD-10-CM | POA: Diagnosis not present

## 2018-04-03 DIAGNOSIS — R41841 Cognitive communication deficit: Secondary | ICD-10-CM | POA: Diagnosis not present

## 2018-04-04 DIAGNOSIS — F319 Bipolar disorder, unspecified: Secondary | ICD-10-CM | POA: Diagnosis not present

## 2018-04-04 DIAGNOSIS — I4819 Other persistent atrial fibrillation: Secondary | ICD-10-CM | POA: Diagnosis not present

## 2018-04-04 DIAGNOSIS — N390 Urinary tract infection, site not specified: Secondary | ICD-10-CM | POA: Diagnosis not present

## 2018-04-04 DIAGNOSIS — G20 Parkinson's disease: Secondary | ICD-10-CM | POA: Diagnosis not present

## 2018-04-04 DIAGNOSIS — R2689 Other abnormalities of gait and mobility: Secondary | ICD-10-CM | POA: Diagnosis not present

## 2018-04-04 DIAGNOSIS — G9341 Metabolic encephalopathy: Secondary | ICD-10-CM | POA: Diagnosis not present

## 2018-04-04 DIAGNOSIS — I480 Paroxysmal atrial fibrillation: Secondary | ICD-10-CM | POA: Diagnosis not present

## 2018-04-04 DIAGNOSIS — N179 Acute kidney failure, unspecified: Secondary | ICD-10-CM | POA: Diagnosis not present

## 2018-04-04 DIAGNOSIS — R41841 Cognitive communication deficit: Secondary | ICD-10-CM | POA: Diagnosis not present

## 2018-04-04 DIAGNOSIS — M6281 Muscle weakness (generalized): Secondary | ICD-10-CM | POA: Diagnosis not present

## 2018-04-04 DIAGNOSIS — G300 Alzheimer's disease with early onset: Secondary | ICD-10-CM | POA: Diagnosis not present

## 2018-04-04 DIAGNOSIS — F0280 Dementia in other diseases classified elsewhere without behavioral disturbance: Secondary | ICD-10-CM | POA: Diagnosis not present

## 2018-04-04 DIAGNOSIS — I455 Other specified heart block: Secondary | ICD-10-CM | POA: Diagnosis not present

## 2018-04-05 DIAGNOSIS — R41841 Cognitive communication deficit: Secondary | ICD-10-CM | POA: Diagnosis not present

## 2018-04-05 DIAGNOSIS — G20 Parkinson's disease: Secondary | ICD-10-CM | POA: Diagnosis not present

## 2018-04-05 DIAGNOSIS — G300 Alzheimer's disease with early onset: Secondary | ICD-10-CM | POA: Diagnosis not present

## 2018-04-05 DIAGNOSIS — N390 Urinary tract infection, site not specified: Secondary | ICD-10-CM | POA: Diagnosis not present

## 2018-04-05 DIAGNOSIS — I4819 Other persistent atrial fibrillation: Secondary | ICD-10-CM | POA: Diagnosis not present

## 2018-04-05 DIAGNOSIS — R2689 Other abnormalities of gait and mobility: Secondary | ICD-10-CM | POA: Diagnosis not present

## 2018-04-05 DIAGNOSIS — F319 Bipolar disorder, unspecified: Secondary | ICD-10-CM | POA: Diagnosis not present

## 2018-04-05 DIAGNOSIS — F0280 Dementia in other diseases classified elsewhere without behavioral disturbance: Secondary | ICD-10-CM | POA: Diagnosis not present

## 2018-04-05 DIAGNOSIS — M6281 Muscle weakness (generalized): Secondary | ICD-10-CM | POA: Diagnosis not present

## 2018-04-06 DIAGNOSIS — G300 Alzheimer's disease with early onset: Secondary | ICD-10-CM | POA: Diagnosis not present

## 2018-04-06 DIAGNOSIS — R41841 Cognitive communication deficit: Secondary | ICD-10-CM | POA: Diagnosis not present

## 2018-04-06 DIAGNOSIS — F0280 Dementia in other diseases classified elsewhere without behavioral disturbance: Secondary | ICD-10-CM | POA: Diagnosis not present

## 2018-04-06 DIAGNOSIS — G20 Parkinson's disease: Secondary | ICD-10-CM | POA: Diagnosis not present

## 2018-04-06 DIAGNOSIS — I4819 Other persistent atrial fibrillation: Secondary | ICD-10-CM | POA: Diagnosis not present

## 2018-04-06 DIAGNOSIS — F319 Bipolar disorder, unspecified: Secondary | ICD-10-CM | POA: Diagnosis not present

## 2018-04-06 DIAGNOSIS — M6281 Muscle weakness (generalized): Secondary | ICD-10-CM | POA: Diagnosis not present

## 2018-04-06 DIAGNOSIS — R2689 Other abnormalities of gait and mobility: Secondary | ICD-10-CM | POA: Diagnosis not present

## 2018-04-06 DIAGNOSIS — N390 Urinary tract infection, site not specified: Secondary | ICD-10-CM | POA: Diagnosis not present

## 2018-04-07 DIAGNOSIS — M6281 Muscle weakness (generalized): Secondary | ICD-10-CM | POA: Diagnosis not present

## 2018-04-07 DIAGNOSIS — G20 Parkinson's disease: Secondary | ICD-10-CM | POA: Diagnosis not present

## 2018-04-07 DIAGNOSIS — N390 Urinary tract infection, site not specified: Secondary | ICD-10-CM | POA: Diagnosis not present

## 2018-04-07 DIAGNOSIS — G300 Alzheimer's disease with early onset: Secondary | ICD-10-CM | POA: Diagnosis not present

## 2018-04-07 DIAGNOSIS — F319 Bipolar disorder, unspecified: Secondary | ICD-10-CM | POA: Diagnosis not present

## 2018-04-07 DIAGNOSIS — R2689 Other abnormalities of gait and mobility: Secondary | ICD-10-CM | POA: Diagnosis not present

## 2018-04-07 DIAGNOSIS — R41841 Cognitive communication deficit: Secondary | ICD-10-CM | POA: Diagnosis not present

## 2018-04-07 DIAGNOSIS — F0280 Dementia in other diseases classified elsewhere without behavioral disturbance: Secondary | ICD-10-CM | POA: Diagnosis not present

## 2018-04-07 DIAGNOSIS — I4819 Other persistent atrial fibrillation: Secondary | ICD-10-CM | POA: Diagnosis not present

## 2018-04-08 ENCOUNTER — Emergency Department (HOSPITAL_COMMUNITY): Payer: Medicare HMO

## 2018-04-08 ENCOUNTER — Encounter (HOSPITAL_COMMUNITY): Payer: Self-pay | Admitting: Emergency Medicine

## 2018-04-08 ENCOUNTER — Other Ambulatory Visit: Payer: Self-pay

## 2018-04-08 ENCOUNTER — Inpatient Hospital Stay (HOSPITAL_COMMUNITY)
Admission: EM | Admit: 2018-04-08 | Discharge: 2018-04-11 | DRG: 243 | Disposition: A | Discharge status: Skilled Nursing Facility | Payer: Medicare HMO | Source: Skilled Nursing Facility | Attending: Internal Medicine | Admitting: Internal Medicine

## 2018-04-08 DIAGNOSIS — R000 Tachycardia, unspecified: Secondary | ICD-10-CM | POA: Diagnosis not present

## 2018-04-08 DIAGNOSIS — N179 Acute kidney failure, unspecified: Secondary | ICD-10-CM | POA: Diagnosis present

## 2018-04-08 DIAGNOSIS — I4819 Other persistent atrial fibrillation: Secondary | ICD-10-CM | POA: Diagnosis present

## 2018-04-08 DIAGNOSIS — R296 Repeated falls: Secondary | ICD-10-CM | POA: Diagnosis present

## 2018-04-08 DIAGNOSIS — Z66 Do not resuscitate: Secondary | ICD-10-CM | POA: Diagnosis present

## 2018-04-08 DIAGNOSIS — Z7982 Long term (current) use of aspirin: Secondary | ICD-10-CM

## 2018-04-08 DIAGNOSIS — M6281 Muscle weakness (generalized): Secondary | ICD-10-CM | POA: Diagnosis not present

## 2018-04-08 DIAGNOSIS — K219 Gastro-esophageal reflux disease without esophagitis: Secondary | ICD-10-CM | POA: Diagnosis present

## 2018-04-08 DIAGNOSIS — I959 Hypotension, unspecified: Secondary | ICD-10-CM | POA: Diagnosis not present

## 2018-04-08 DIAGNOSIS — I10 Essential (primary) hypertension: Secondary | ICD-10-CM | POA: Diagnosis present

## 2018-04-08 DIAGNOSIS — R0602 Shortness of breath: Secondary | ICD-10-CM | POA: Diagnosis not present

## 2018-04-08 DIAGNOSIS — Z8249 Family history of ischemic heart disease and other diseases of the circulatory system: Secondary | ICD-10-CM

## 2018-04-08 DIAGNOSIS — R0789 Other chest pain: Secondary | ICD-10-CM | POA: Diagnosis present

## 2018-04-08 DIAGNOSIS — G2581 Restless legs syndrome: Secondary | ICD-10-CM | POA: Diagnosis present

## 2018-04-08 DIAGNOSIS — G300 Alzheimer's disease with early onset: Secondary | ICD-10-CM | POA: Diagnosis not present

## 2018-04-08 DIAGNOSIS — R079 Chest pain, unspecified: Secondary | ICD-10-CM | POA: Diagnosis not present

## 2018-04-08 DIAGNOSIS — Z7401 Bed confinement status: Secondary | ICD-10-CM | POA: Diagnosis not present

## 2018-04-08 DIAGNOSIS — F0280 Dementia in other diseases classified elsewhere without behavioral disturbance: Secondary | ICD-10-CM | POA: Diagnosis present

## 2018-04-08 DIAGNOSIS — Z833 Family history of diabetes mellitus: Secondary | ICD-10-CM

## 2018-04-08 DIAGNOSIS — E039 Hypothyroidism, unspecified: Secondary | ICD-10-CM | POA: Diagnosis present

## 2018-04-08 DIAGNOSIS — I495 Sick sinus syndrome: Principal | ICD-10-CM | POA: Diagnosis present

## 2018-04-08 DIAGNOSIS — E785 Hyperlipidemia, unspecified: Secondary | ICD-10-CM | POA: Diagnosis present

## 2018-04-08 DIAGNOSIS — R0902 Hypoxemia: Secondary | ICD-10-CM | POA: Diagnosis not present

## 2018-04-08 DIAGNOSIS — Z950 Presence of cardiac pacemaker: Secondary | ICD-10-CM | POA: Diagnosis not present

## 2018-04-08 DIAGNOSIS — F319 Bipolar disorder, unspecified: Secondary | ICD-10-CM | POA: Diagnosis present

## 2018-04-08 DIAGNOSIS — Z79890 Hormone replacement therapy (postmenopausal): Secondary | ICD-10-CM | POA: Diagnosis not present

## 2018-04-08 DIAGNOSIS — R072 Precordial pain: Secondary | ICD-10-CM | POA: Diagnosis not present

## 2018-04-08 DIAGNOSIS — G309 Alzheimer's disease, unspecified: Secondary | ICD-10-CM | POA: Diagnosis present

## 2018-04-08 DIAGNOSIS — G20 Parkinson's disease: Secondary | ICD-10-CM | POA: Diagnosis present

## 2018-04-08 DIAGNOSIS — I480 Paroxysmal atrial fibrillation: Secondary | ICD-10-CM | POA: Diagnosis not present

## 2018-04-08 DIAGNOSIS — R41841 Cognitive communication deficit: Secondary | ICD-10-CM | POA: Diagnosis not present

## 2018-04-08 DIAGNOSIS — R2689 Other abnormalities of gait and mobility: Secondary | ICD-10-CM | POA: Diagnosis not present

## 2018-04-08 DIAGNOSIS — M2550 Pain in unspecified joint: Secondary | ICD-10-CM | POA: Diagnosis not present

## 2018-04-08 DIAGNOSIS — N390 Urinary tract infection, site not specified: Secondary | ICD-10-CM | POA: Diagnosis not present

## 2018-04-08 LAB — CBC WITH DIFFERENTIAL/PLATELET
Abs Immature Granulocytes: 0.07 10*3/uL (ref 0.00–0.07)
Basophils Absolute: 0 10*3/uL (ref 0.0–0.1)
Basophils Relative: 0 %
EOS ABS: 0.2 10*3/uL (ref 0.0–0.5)
Eosinophils Relative: 2 %
HCT: 44.8 % (ref 36.0–46.0)
Hemoglobin: 13.9 g/dL (ref 12.0–15.0)
Immature Granulocytes: 1 %
LYMPHS ABS: 2.8 10*3/uL (ref 0.7–4.0)
Lymphocytes Relative: 32 %
MCH: 27.4 pg (ref 26.0–34.0)
MCHC: 31 g/dL (ref 30.0–36.0)
MCV: 88.2 fL (ref 80.0–100.0)
Monocytes Absolute: 0.6 10*3/uL (ref 0.1–1.0)
Monocytes Relative: 7 %
NRBC: 0 % (ref 0.0–0.2)
Neutro Abs: 5.1 10*3/uL (ref 1.7–7.7)
Neutrophils Relative %: 58 %
PLATELETS: 210 10*3/uL (ref 150–400)
RBC: 5.08 MIL/uL (ref 3.87–5.11)
RDW: 14.6 % (ref 11.5–15.5)
WBC: 8.7 10*3/uL (ref 4.0–10.5)

## 2018-04-08 LAB — URINALYSIS, ROUTINE W REFLEX MICROSCOPIC
Bilirubin Urine: NEGATIVE
GLUCOSE, UA: NEGATIVE mg/dL
Hgb urine dipstick: NEGATIVE
Ketones, ur: NEGATIVE mg/dL
Nitrite: NEGATIVE
Protein, ur: NEGATIVE mg/dL
Specific Gravity, Urine: 1.013 (ref 1.005–1.030)
pH: 7 (ref 5.0–8.0)

## 2018-04-08 LAB — COMPREHENSIVE METABOLIC PANEL
ALT: 12 U/L (ref 0–44)
AST: 33 U/L (ref 15–41)
Albumin: 3.1 g/dL — ABNORMAL LOW (ref 3.5–5.0)
Alkaline Phosphatase: 102 U/L (ref 38–126)
Anion gap: 10 (ref 5–15)
BUN: 14 mg/dL (ref 8–23)
CALCIUM: 9.1 mg/dL (ref 8.9–10.3)
CO2: 23 mmol/L (ref 22–32)
Chloride: 106 mmol/L (ref 98–111)
Creatinine, Ser: 1.09 mg/dL — ABNORMAL HIGH (ref 0.44–1.00)
GFR calc Af Amer: 58 mL/min — ABNORMAL LOW (ref >60)
GFR calc non Af Amer: 50 mL/min — ABNORMAL LOW (ref >60)
Glucose, Bld: 123 mg/dL — ABNORMAL HIGH (ref 70–99)
Potassium: 4 mmol/L (ref 3.5–5.1)
Sodium: 139 mmol/L (ref 135–145)
Total Bilirubin: 1 mg/dL (ref 0.3–1.2)
Total Protein: 7.2 g/dL (ref 6.5–8.1)

## 2018-04-08 LAB — LIPASE, BLOOD: Lipase: 27 U/L (ref 11–51)

## 2018-04-08 LAB — I-STAT TROPONIN, ED: Troponin i, poc: 0 ng/mL (ref 0.00–0.08)

## 2018-04-08 MED ORDER — CYCLOBENZAPRINE HCL 10 MG PO TABS
5.0000 mg | ORAL_TABLET | Freq: Every day | ORAL | Status: DC
  Administered 2018-04-09 – 2018-04-10 (×3): 5 mg via ORAL
  Filled 2018-04-08 (×3): qty 1

## 2018-04-08 MED ORDER — OLANZAPINE 5 MG PO TABS
15.0000 mg | ORAL_TABLET | Freq: Every day | ORAL | Status: DC
  Administered 2018-04-09 – 2018-04-11 (×2): 15 mg via ORAL
  Filled 2018-04-08: qty 3
  Filled 2018-04-08 (×6): qty 2

## 2018-04-08 MED ORDER — LEVOTHYROXINE SODIUM 50 MCG PO TABS
50.0000 ug | ORAL_TABLET | Freq: Every day | ORAL | Status: DC
  Administered 2018-04-09 – 2018-04-11 (×3): 50 ug via ORAL
  Filled 2018-04-08 (×3): qty 1

## 2018-04-08 MED ORDER — TEMAZEPAM 7.5 MG PO CAPS
7.5000 mg | ORAL_CAPSULE | Freq: Every day | ORAL | Status: DC
  Administered 2018-04-09 – 2018-04-10 (×3): 7.5 mg via ORAL
  Filled 2018-04-08 (×3): qty 1

## 2018-04-08 MED ORDER — IOPAMIDOL (ISOVUE-370) INJECTION 76%
INTRAVENOUS | Status: AC
  Administered 2018-04-08: 60 mL
  Filled 2018-04-08: qty 100

## 2018-04-08 MED ORDER — ASPIRIN EC 81 MG PO TBEC
81.0000 mg | DELAYED_RELEASE_TABLET | Freq: Every day | ORAL | Status: DC
  Administered 2018-04-09 – 2018-04-11 (×2): 81 mg via ORAL
  Filled 2018-04-08 (×2): qty 1

## 2018-04-08 MED ORDER — FENOFIBRATE 54 MG PO TABS
54.0000 mg | ORAL_TABLET | Freq: Every day | ORAL | Status: DC
  Administered 2018-04-09 – 2018-04-11 (×2): 54 mg via ORAL
  Filled 2018-04-08 (×3): qty 1

## 2018-04-08 MED ORDER — CARBIDOPA-LEVODOPA 25-100 MG PO TABS
1.0000 | ORAL_TABLET | Freq: Three times a day (TID) | ORAL | Status: DC
  Administered 2018-04-09 – 2018-04-11 (×8): 1 via ORAL
  Filled 2018-04-08 (×9): qty 1

## 2018-04-08 MED ORDER — OXYBUTYNIN CHLORIDE ER 10 MG PO TB24
10.0000 mg | ORAL_TABLET | Freq: Every day | ORAL | Status: DC
  Administered 2018-04-09 – 2018-04-10 (×3): 10 mg via ORAL
  Filled 2018-04-08 (×3): qty 1

## 2018-04-08 MED ORDER — PANTOPRAZOLE SODIUM 40 MG PO TBEC
40.0000 mg | DELAYED_RELEASE_TABLET | Freq: Every day | ORAL | Status: DC
  Administered 2018-04-09 – 2018-04-11 (×2): 40 mg via ORAL
  Filled 2018-04-08 (×2): qty 1

## 2018-04-08 MED ORDER — ROPINIROLE HCL 0.5 MG PO TABS
0.5000 mg | ORAL_TABLET | Freq: Every day | ORAL | Status: DC
  Administered 2018-04-09 – 2018-04-10 (×3): 0.5 mg via ORAL
  Filled 2018-04-08 (×4): qty 1

## 2018-04-08 NOTE — ED Notes (Signed)
Patient transported to CT 

## 2018-04-08 NOTE — ED Triage Notes (Signed)
Per [facility] patient coming from [facility]. Patient was discharged on the 12th after going in and out of a-fib. Patient c/o central chest pain x 4 days.

## 2018-04-08 NOTE — ED Provider Notes (Signed)
[facility] EMERGENCY DEPARTMENT Provider Note   CSN: [id number] Arrival date & time: 04/08/18  1735     History   Chief Complaint Chief Complaint  Patient presents with  . Chest Pain    HPI Jessica Hayes is a 73 y.o. female.  73 year old female with prior medical history as detailed below presents for evaluation of chest discomfort.  Patient is a resident at a [facility].  EMS transported her to the ED today for evaluation of reported chest discomfort.  Patient reports ongoing persistent chest discomfort the last 4 days.  She describes midsternal chest discomfort.  Pain has been continuously present for the last 4 days.  She denies associated shortness of breath, nausea, vomiting, back pain, or other acute complaint.  Of note, patient was recently admitted for issues regarding her atrial fibrillation and discharged on 1/12.  The history is provided by the patient, medical records and the EMS personnel.  Chest Pain  Pain location:  Substernal area Pain quality: dull   Pain radiates to:  Does not radiate Pain severity:  Mild Onset quality:  Gradual Duration:  4 days Timing:  Constant Progression:  Waxing and waning Chronicity:  New Context: breathing   Relieved by:  Nothing Worsened by:  Nothing   Past Medical History:  Diagnosis Date  . Alzheimer disease (HCC)   . Anxiety   . Bipolar disorder, unspecified (HCC)   . Dementia (HCC)   . Depressive disorder   . GERD (gastroesophageal reflux disease)   . Hypercholesteremia   . Hypertension   . Insomnia   . Lumbar disc disease    degenerative  . Parkinson disease (HCC)   . Restless leg syndrome   . Thyroid disease    hypothyroid  . UTI (urinary tract infection)   . Weakness     Patient Active Problem List   Diagnosis Date Noted  . Goals of care, counseling/discussion   . Palliative care by specialist   . Persistent atrial fibrillation 03/28/2018  . Sinus pause 03/28/2018  . Acute  lower UTI 03/28/2018  . Dehydration 03/26/2018  . Alzheimer's dementia (HCC) 03/26/2018  . Essential hypertension 03/26/2018  . Hyperlipidemia 03/26/2018  . Hypothyroidism 03/26/2018  . AMS (altered mental status) 03/25/2018    History reviewed. No pertinent surgical history.   OB History   No obstetric history on file.      Home Medications    Prior to Admission medications   Medication Sig Start Date End Date Taking? Authorizing Provider  aspirin EC 81 MG tablet Take 81 mg by mouth daily.    [provider]  carbidopa-levodopa (SINEMET IR) 25-100 MG tablet Take 1 tablet by mouth 3 (three) times daily.    [provider]  cyclobenzaprine (FLEXERIL) 5 MG tablet Take 5 mg by mouth at bedtime.    [provider]  escitalopram (LEXAPRO) 10 MG tablet Take 15 mg by mouth daily.    [provider]  fenofibrate (TRICOR) 48 MG tablet Take 48 mg by mouth daily.    [provider]  furosemide (LASIX) 20 MG tablet Take 1 tablet (20 mg total) by mouth as needed for edema. 03/31/18   Roberto Scales D, MD  LACTOBACILLUS PO Take 1 capsule by mouth daily.    [provider]  levothyroxine (SYNTHROID, LEVOTHROID) 50 MCG tablet Take 50 mcg by mouth daily before breakfast.    [provider]  mirabegron ER (MYRBETRIQ) 50 MG TB24 tablet Take 50 mg by  mouth daily.    [provider]  OLANZapine (ZYPREXA) 15 MG tablet Take 15 mg by mouth at bedtime.    [provider]  omeprazole (PRILOSEC) 20 MG capsule Take 20 mg by mouth every other day.    [provider]  oxybutynin (DITROPAN-XL) 10 MG 24 hr tablet Take 10 mg by mouth at bedtime.    [provider]  rOPINIRole (REQUIP) 0.5 MG tablet Take 0.5 mg by mouth at bedtime.    [provider]  temazepam (RESTORIL) 7.5 MG capsule Take 7.5 mg by mouth at bedtime.    [provider]  traMADol (ULTRAM) 50 MG tablet Take 50 mg by mouth daily.     [provider]    Family History History reviewed. No pertinent family history.  Social History Social History   Tobacco Use  . Smoking status: Unknown If Ever Smoked  . Smokeless tobacco: Never Used  Substance Use Topics  . Alcohol use: Not Currently  . Drug use: Not Currently     Allergies   Demerol [meperidine hcl]   Review of Systems Review of Systems  Cardiovascular: Positive for chest pain.  All other systems reviewed and are negative.    Physical Exam Updated Vital Signs BP 110/61   Pulse 93   Temp 97.9 F (36.6 C) (Oral)   Resp 14   SpO2 98%   Physical Exam Vitals signs and nursing note reviewed.  Constitutional:      General: She is not in acute distress.    Appearance: She is well-developed.  HENT:     Head: Normocephalic and atraumatic.  Eyes:     Conjunctiva/sclera: Conjunctivae normal.     Pupils: Pupils are equal, round, and reactive to light.  Neck:     Musculoskeletal: Normal range of motion and neck supple.  Cardiovascular:     Rate and Rhythm: Normal rate and regular rhythm.     Heart sounds: Normal heart sounds.  Pulmonary:     Effort: Pulmonary effort is normal. No respiratory distress.     Breath sounds: Normal breath sounds.  Abdominal:     General: There is no distension.     Palpations: Abdomen is soft.     Tenderness: There is no abdominal tenderness.  Musculoskeletal: Normal range of motion.        General: No deformity.  Skin:    General: Skin is warm and dry.  Neurological:     General: No focal deficit present.     Mental Status: She is alert and oriented to person, place, and time.      ED Treatments / Results  Labs (all labs ordered are listed, but only abnormal results are displayed) Labs Reviewed  COMPREHENSIVE METABOLIC PANEL - Abnormal; Notable for the following components:      Result Value   Glucose, Bld 123 (*)    Creatinine, Ser 1.09 (*)    Albumin 3.1 (*)    GFR calc non Af Amer 50 (*)      GFR calc Af Amer 58 (*)    All other components within normal limits  URINALYSIS, ROUTINE W REFLEX MICROSCOPIC - Abnormal; Notable for the following components:   Leukocytes, UA TRACE (*)    Bacteria, UA FEW (*)    All other components within normal limits  CBC WITH DIFFERENTIAL/PLATELET  LIPASE, BLOOD  I-STAT TROPONIN, ED    EKG EKG Interpretation  Date/Time:  Monday April 08 2018 17:42:52 EST Ventricular Rate:  110 PR  Interval:    QRS Duration: 96 QT Interval:  305 QTC Calculation: 398 R Axis:   -31 Text Interpretation:  Atrial fibrillation LVH with secondary repolarization abnormality Confirmed by Kristine Royal [phone]) on 04/08/2018 5:47:10 PM   Radiology Dg Chest Port 1 View  Result Date: 04/08/2018 CLINICAL DATA:  73 year old female with chest pains for the past 4 days, increased shortness of breath EXAM: PORTABLE CHEST 1 VIEW COMPARISON:  Prior chest x-ray 03/25/2018 FINDINGS: The lungs are clear and negative for focal airspace consolidation, pulmonary edema or suspicious pulmonary nodule. No pleural effusion or pneumothorax. Cardiac and mediastinal contours are within normal limits. No acute fracture or lytic or blastic osseous lesions. Calcifications are present in the transverse aorta. The visualized upper abdominal bowel gas pattern is unremarkable. IMPRESSION: No active disease. Electronically Signed   By: Malachy Moan M.D.   On: 04/08/2018 18:00    Procedures Procedures (including critical care time)  Medications Ordered in ED Medications - No data to display   Initial Impression / Assessment and Plan / ED Course  I have reviewed the triage vital signs and the nursing notes.  Pertinent labs & imaging results that were available during my care of the patient were reviewed by me and considered in my medical decision making (see chart for details).     MDM  Screen complete  Patient is presenting for evaluation of reported chest pain.  Initial EKG  is without evidence of acute ischemia.  Initial troponin is negative.  CTA did not reveal evidence of PE or other significant pulmonary or cardiac pathology.  Patient will be admitted for further work-up and rule out.  Hospital service is aware of case and will evaluate for admission.   Final Clinical Impressions(s) / ED Diagnoses   Final diagnoses:  Chest pain, unspecified type    ED Discharge Orders    None       Wynetta Fines, MD 04/08/18 [phone]

## 2018-04-09 ENCOUNTER — Observation Stay (HOSPITAL_BASED_OUTPATIENT_CLINIC_OR_DEPARTMENT_OTHER): Payer: Medicare HMO

## 2018-04-09 ENCOUNTER — Encounter (HOSPITAL_COMMUNITY): Payer: Self-pay | Admitting: Physician Assistant

## 2018-04-09 DIAGNOSIS — R079 Chest pain, unspecified: Secondary | ICD-10-CM

## 2018-04-09 DIAGNOSIS — I455 Other specified heart block: Secondary | ICD-10-CM

## 2018-04-09 DIAGNOSIS — I480 Paroxysmal atrial fibrillation: Secondary | ICD-10-CM

## 2018-04-09 DIAGNOSIS — F0280 Dementia in other diseases classified elsewhere without behavioral disturbance: Secondary | ICD-10-CM

## 2018-04-09 DIAGNOSIS — K219 Gastro-esophageal reflux disease without esophagitis: Secondary | ICD-10-CM

## 2018-04-09 DIAGNOSIS — I4819 Other persistent atrial fibrillation: Secondary | ICD-10-CM

## 2018-04-09 DIAGNOSIS — I495 Sick sinus syndrome: Principal | ICD-10-CM

## 2018-04-09 DIAGNOSIS — G300 Alzheimer's disease with early onset: Secondary | ICD-10-CM

## 2018-04-09 DIAGNOSIS — I10 Essential (primary) hypertension: Secondary | ICD-10-CM

## 2018-04-09 LAB — BASIC METABOLIC PANEL
Anion gap: 12 (ref 5–15)
BUN: 13 mg/dL (ref 8–23)
CO2: 21 mmol/L — ABNORMAL LOW (ref 22–32)
Calcium: 9.1 mg/dL (ref 8.9–10.3)
Chloride: 106 mmol/L (ref 98–111)
Creatinine, Ser: 0.96 mg/dL (ref 0.44–1.00)
GFR calc Af Amer: >60 mL/min (ref >60)
GFR calc non Af Amer: 59 mL/min — ABNORMAL LOW (ref >60)
Glucose, Bld: 101 mg/dL — ABNORMAL HIGH (ref 70–99)
Potassium: 3.7 mmol/L (ref 3.5–5.1)
Sodium: 139 mmol/L (ref 135–145)

## 2018-04-09 LAB — TROPONIN I
Troponin I: <0.03 ng/mL (ref <0.03)
Troponin I: <0.03 ng/mL (ref <0.03)
Troponin I: <0.03 ng/mL (ref <0.03)

## 2018-04-09 LAB — ECHOCARDIOGRAM COMPLETE: Weight: 2761.92 oz

## 2018-04-09 LAB — MAGNESIUM: Magnesium: 2.1 mg/dL (ref 1.7–2.4)

## 2018-04-09 MED ORDER — ENOXAPARIN SODIUM 40 MG/0.4ML ~~LOC~~ SOLN: 40.0000 mg | Freq: Every day | SUBCUTANEOUS | Status: DC

## 2018-04-09 MED ORDER — SODIUM CHLORIDE 0.9 % IV SOLN: 250.0000 mL | INTRAVENOUS | Status: DC

## 2018-04-09 MED ORDER — PANTOPRAZOLE SODIUM 40 MG PO TBEC: 40.0000 mg | DELAYED_RELEASE_TABLET | Freq: Every day | ORAL | 0 refills | Status: AC

## 2018-04-09 MED ORDER — ONDANSETRON HCL 4 MG/2ML IJ SOLN: 4.0000 mg | Freq: Four times a day (QID) | INTRAMUSCULAR | Status: DC | PRN

## 2018-04-09 MED ORDER — CHLORHEXIDINE GLUCONATE 4 % EX LIQD
60.0000 mL | Freq: Once | CUTANEOUS | Status: AC
  Administered 2018-04-09: 4 via TOPICAL
  Filled 2018-04-09: qty 60

## 2018-04-09 MED ORDER — SODIUM CHLORIDE 0.9 % IV SOLN
INTRAVENOUS | Status: DC
  Administered 2018-04-10: 06:00:00 via INTRAVENOUS

## 2018-04-09 MED ORDER — SODIUM CHLORIDE 0.9% FLUSH
3.0000 mL | Freq: Two times a day (BID) | INTRAVENOUS | Status: DC
  Administered 2018-04-09: 3 mL via INTRAVENOUS

## 2018-04-09 MED ORDER — SODIUM CHLORIDE 0.9% FLUSH: 3.0000 mL | INTRAVENOUS | Status: DC | PRN

## 2018-04-09 MED ORDER — ACETAMINOPHEN 325 MG PO TABS
650.0000 mg | ORAL_TABLET | ORAL | Status: DC | PRN
  Administered 2018-04-09 – 2018-04-10 (×3): 650 mg via ORAL
  Filled 2018-04-09 (×3): qty 2

## 2018-04-09 MED ORDER — SODIUM CHLORIDE 0.9 % IV SOLN
80.0000 mg | INTRAVENOUS | Status: AC
  Administered 2018-04-10: 80 mg
  Filled 2018-04-09: qty 2

## 2018-04-09 MED ORDER — CEFAZOLIN SODIUM-DEXTROSE 2-4 GM/100ML-% IV SOLN
2.0000 g | INTRAVENOUS | Status: AC
  Administered 2018-04-10: 2 g via INTRAVENOUS
  Filled 2018-04-09: qty 100

## 2018-04-09 MED ORDER — CHLORHEXIDINE GLUCONATE 4 % EX LIQD
60.0000 mL | Freq: Once | CUTANEOUS | Status: DC
  Filled 2018-04-09: qty 15

## 2018-04-09 NOTE — Discharge Summary (Signed)
Physician Discharge Summary  Jessica Hayes BJY:[id number] DOB: 08/17/1945 DOA: 04/08/2018  PCP: Jessica Floor., MD  Admit date: 04/08/2018 Discharge date: 04/09/2018  Admitted From: SNF Disposition:  SNF  Recommendations for Outpatient Follow-up:  1. Follow up with PCP in 1-2 weeks 2. Please obtain BMP/CBC in one week 3. Please follow up on the following pending results:  Home Health: none Equipment/Devices: none  Discharge Condition: stable CODE STATUS: full code Diet recommendation: Regular  HPI: Per Dr. Cornelious Hayes is a 73 y.o. female with medical history significant of Alzheimer's disease, parkinson's dz, HTN, BPD. Patient resides at SNF at baseline due to advanced dementia. Patient admitted earlier this month for encephalopathy believed to be due to UTI.  MRI was negative for stroke findings at that time.  Found to be in A.Fib.  Initially started on beta blocker for rate control however had significant sinus pauses with this so beta blocker stopped.  She presents to the ED today with report of 4 day history of CP.  Midsternal discomfort.  Continuously present patient reports.  No associated N/V, SOB.  No radiation.  Hospital Course: Patient with history as above admitted with chest pain.  Serial troponin and EKG negative for acute ischemic finding.  CTA chest without PE or pneumonia.  She had atrial fibrillation with RVR to 110 on admission.  She converted to sinus rhythm spontaneously.  She also had a 7-second pause on telemetry and converted back to sinus rhythm.  As a result of this, cardiology did not feel it is safe to start her on nodal blocking agents.  Cardiology also recommended against anticoagulation.  Cardiology did not feel her chest pain is of cardiac etiology and signed off. I have discussed patient's condition and discharge plan with patient's daughter, Ms. Woody Seller over the phone.  Increased her PPI dose and discharged home back to  SNF.  Discharge Diagnoses:  Principal Problem:   Chest pain, rule out acute myocardial infarction Active Problems:   Alzheimer's dementia [facility])   Essential hypertension   Persistent atrial fibrillation  Atypical chest pain: serial troponin and EKG negative for acute ischemic finding.  However, EKG revealed atrial fibrillation with RVR to 110 on admission.  She spontaneously converted to sinus rhythm.  A. fib could have contributed to chest pain.  She had an episode of 7-second sinus pause on telemetry.  As a result of this, cardiology did not want to put her on nodal blocking agent.  They also recommended against anticoagulation.  Patient had a CTA chest which was negative for PE or pneumonia.  Her PPI dose increased patient discharged back to SNF.  Paroxysmal atrial fibrillation: RVR resolved.  She spontaneously converted to sinus rhythm.  No rate control due to sinus pause on telemetry.  Patient previously refused pacemaker per cardiology.  Cardiology recommended against anticoagulation.  GERD: Discontinued omeprazole 20 mg daily.  Started Protonix 40 mg daily.  These could be contributing to chest pain.  Other medical conditions including Alzheimer's dementia, Parkinson disease, restless leg syndrome and hypertension stable.  Discharged on home medications.  Mild AKI: Resolved. Check BMP at follow-up.  Discharge Instructions  Discharge Instructions    Call MD for:  difficulty breathing, headache or visual disturbances   Complete by:  As directed    Call MD for:  extreme fatigue   Complete by:  As directed    Call MD for:  persistant dizziness or light-headedness   Complete by:  As directed  Call MD for:  persistant nausea and vomiting   Complete by:  As directed    Call MD for:  severe uncontrolled pain   Complete by:  As directed    Diet - low sodium heart healthy   Complete by:  As directed    Increase activity slowly   Complete by:  As directed      Allergies as of  04/09/2018      Reactions   Demerol [meperidine Hcl] Other (See Comments)   [facility]      Medication List    STOP taking these medications   omeprazole 20 MG capsule Commonly known as:  PRILOSEC Replaced by:  pantoprazole 40 MG tablet     TAKE these medications   aspirin EC 81 MG tablet Take 81 mg by mouth daily.   carbidopa-levodopa 25-100 MG tablet Commonly known as:  SINEMET IR Take 1 tablet by mouth 3 (three) times daily.   cyclobenzaprine 5 MG tablet Commonly known as:  FLEXERIL Take 5 mg by mouth at bedtime.   fenofibrate 48 MG tablet Commonly known as:  TRICOR Take 48 mg by mouth daily.   furosemide 20 MG tablet Commonly known as:  LASIX Take 1 tablet (20 mg total) by mouth as needed for edema. What changed:  when to take this   LACTOBACILLUS PO Take 1 capsule by mouth daily.   levothyroxine 50 MCG tablet Commonly known as:  SYNTHROID, LEVOTHROID Take 50 mcg by mouth daily before breakfast.   OLANZapine 15 MG tablet Commonly known as:  ZYPREXA Take 15 mg by mouth at bedtime.   oxybutynin 10 MG 24 hr tablet Commonly known as:  DITROPAN-XL Take 10 mg by mouth at bedtime.   pantoprazole 40 MG tablet Commonly known as:  PROTONIX Take 1 tablet (40 mg total) by mouth daily. Start taking on:  April 10, 2018 Replaces:  omeprazole 20 MG capsule   rOPINIRole 0.5 MG tablet Commonly known as:  REQUIP Take 0.5 mg by mouth at bedtime.   temazepam 7.5 MG capsule Commonly known as:  RESTORIL Take 7.5 mg by mouth at bedtime.      Follow-up Information    Jessica Floorampbell, Stephen D., MD Follow up in 1 week(s).   Specialty:  Internal Medicine Contact information: [address] [place] [address] [phone]           Consultations:  Cardiology  Procedures/Studies:  2D echo-none this admission  Dg Chest 2 View  Result Date: 03/25/2018 CLINICAL DATA:  Altered mental status EXAM: CHEST - 2 VIEW COMPARISON:   None. FINDINGS: Heart size and vascularity normal. Mild bibasilar atelectasis. Negative for effusion or mass. No acute skeletal abnormality. IMPRESSION: Mild bibasilar atelectasis. Electronically Signed   By: Marlan Palauharles  Clark M.D.   On: 03/25/2018 13:42   Ct Head Wo Contrast  Result Date: 03/25/2018 CLINICAL DATA:  Altered level of consciousness. EXAM: CT HEAD WITHOUT CONTRAST TECHNIQUE: Contiguous axial images were obtained from the base of the skull through the vertex without intravenous contrast. COMPARISON:  None. FINDINGS: Brain: No evidence of acute infarction, hemorrhage, hydrocephalus, extra-axial collection or mass lesion/mass effect. There is mild diffuse low-attenuation within the subcortical and periventricular white matter compatible with chronic microvascular disease. Vascular: No hyperdense vessel or unexpected calcification. Skull: Normal. Negative for fracture or focal lesion. Sinuses/Orbits: No acute finding. Other: None IMPRESSION: 1. No acute intracranial abnormalities. 2. Chronic small vessel ischemic change. Electronically Signed   By: Signa Kellaylor  Stroud  M.D.   On: 03/25/2018 14:42   Ct Angio Chest Pe W And/or Wo Contrast  Result Date: 04/08/2018 CLINICAL DATA:  Central chest pain x4 days with history of atrial fibrillation. EXAM: CT ANGIOGRAPHY CHEST WITH CONTRAST TECHNIQUE: Multidetector CT imaging of the chest was performed using the standard protocol during bolus administration of intravenous contrast. Multiplanar CT image reconstructions and MIPs were obtained to evaluate the vascular anatomy. CONTRAST:  60mL ISOVUE-370 IOPAMIDOL (ISOVUE-370) INJECTION 76% COMPARISON:  04/25/2017 FINDINGS: Cardiovascular: Conventional branch pattern of the great vessels with atherosclerotic aorta. No thoracic aortic aneurysm or dissection. Satisfactory opacification of the pulmonary arteries to the segmental level without pulmonary embolus. Heart size is stable and within normal limits with coronary  arteriosclerosis. Mediastinum/Nodes: No thyromegaly or mass. Patent trachea and mainstem bronchi. The CT appearance of the esophagus is unremarkable. No pathologically enlarged lymph nodes. Lungs/Pleura: Low lung volumes with bibasilar atelectasis and trace pleural effusions. Faint ground-glass opacities noted throughout both lungs likely reflect areas of hypoventilatory change. Right-sided pulmonary nodules measuring 4 mm or less are again noted, some of which appear calcified, series 7, image 68, 63, 46 and 24. Upper Abdomen: Status post cholecystectomy. No acute abnormality within the upper abdomen. Musculoskeletal: No chest wall abnormality. No acute or significant osseous findings. Review of the MIP images confirms the above findings. IMPRESSION: 1. No acute pulmonary embolus, aortic aneurysm or dissection. 2. Stable right-sided 4 mm or less pulmonary nodules. Aortic Atherosclerosis (ICD10-I70.0). Electronically Signed   By: Tollie Ethavid  Kwon M.D.   On: 04/08/2018 22:51   Mr Brain Wo Contrast  Result Date: 03/26/2018 CLINICAL DATA:  Focal neuro deficit.  Altered mental status. EXAM: MRI HEAD WITHOUT CONTRAST TECHNIQUE: Multiplanar, multiecho pulse sequences of the brain and surrounding structures were obtained without intravenous contrast. COMPARISON:  Head CT from yesterday FINDINGS: Brain: No acute infarction, hemorrhage, hydrocephalus, extra-axial collection or mass lesion. History of dementia but no notable atrophy. Minor periventricular chronic small vessel ischemia. Vascular: Major flow voids are preserved Skull and upper cervical spine: Negative for marrow lesion. Sinuses/Orbits: Negative Other: Significant and progressive motion degradation. IMPRESSION: Significantly motion degraded study that is otherwise unremarkable for age. Electronically Signed   By: Marnee SpringJonathon  Watts M.D.   On: 03/26/2018 05:00   Dg Chest Port 1 View  Result Date: 04/08/2018 CLINICAL DATA:  73 year old female with chest pains for  the past 4 days, increased shortness of breath EXAM: PORTABLE CHEST 1 VIEW COMPARISON:  Prior chest x-ray 03/25/2018 FINDINGS: The lungs are clear and negative for focal airspace consolidation, pulmonary edema or suspicious pulmonary nodule. No pleural effusion or pneumothorax. Cardiac and mediastinal contours are within normal limits. No acute fracture or lytic or blastic osseous lesions. Calcifications are present in the transverse aorta. The visualized upper abdominal bowel gas pattern is unremarkable. IMPRESSION: No active disease. Electronically Signed   By: Malachy MoanHeath  McCullough M.D.   On: 04/08/2018 18:00     Subjective: Reports pain over epigastric area.  She stated that the pain has improved.  Denies dyspnea.  Had some sinus pause for 7 seconds overnight.  Denies palpitation.  Denies nausea or vomiting.  Discharge Exam: Vitals:   04/09/18 0741 04/09/18 1152  BP: 125/67 121/66  Pulse: 79 84  Resp: 15 15  Temp: 97.6 F (36.4 C) (!) 97.5 F (36.4 C)  SpO2: 98% 100%    GENERAL: Appears well. No acute distress.  HEENT: MMM.  Vision and Hearing grossly intact.  NECK: Supple.  No JVD.  LUNGS:  No  IWOB. Good air movement. CTAB.  HEART:  RRR. Heart sounds normal. ABD: Bowel sounds present. Soft. Non tender.  EXT:   no edema bilaterally.  SKIN: no apparent skin lesion.  NEURO: Awake, alert and oriented to self, time place and person.  Initially thought she was at [facility] but recovered quickly. PSYCH: Calm. Normal affect.   The results of significant diagnostics from this hospitalization (including imaging, microbiology, ancillary and laboratory) are listed below for reference.     Microbiology: No results found for this or any previous visit (from the past 240 hour(s)).   Labs: BNP (last 3 results) No results for input(s): BNP in the last 8760 hours. Basic Metabolic Panel: Recent Labs  Lab 04/08/18 1754 04/09/18 0815  NA 139 139  K 4.0 3.7  CL 106 106  CO2 23 21*  GLUCOSE 123*  101*  BUN 14 13  CREATININE 1.09* 0.96  CALCIUM 9.1 9.1  MG  --  2.1   Liver Function Tests: Recent Labs  Lab 04/08/18 1754  AST 33  ALT 12  ALKPHOS 102  BILITOT 1.0  PROT 7.2  ALBUMIN 3.1*   Recent Labs  Lab 04/08/18 1754  LIPASE 27   No results for input(s): AMMONIA in the last 168 hours. CBC: Recent Labs  Lab 04/08/18 1754  WBC 8.7  NEUTROABS 5.1  HGB 13.9  HCT 44.8  MCV 88.2  PLT 210   Cardiac Enzymes: Recent Labs  Lab 04/09/18 0019 04/09/18 0327 04/09/18 0633  TROPONINI <0.03 <0.03 <0.03   BNP: Invalid input(s): POCBNP CBG: No results for input(s): GLUCAP in the last 168 hours. D-Dimer No results for input(s): DDIMER in the last 72 hours. Hgb A1c No results for input(s): HGBA1C in the last 72 hours. Lipid Profile No results for input(s): CHOL, HDL, LDLCALC, TRIG, CHOLHDL, LDLDIRECT in the last 72 hours. Thyroid function studies No results for input(s): TSH, T4TOTAL, T3FREE, THYROIDAB in the last 72 hours.  Invalid input(s): FREET3 Anemia work up No results for input(s): VITAMINB12, FOLATE, FERRITIN, TIBC, IRON, RETICCTPCT in the last 72 hours. Urinalysis    Component Value Date/Time   COLORURINE YELLOW 04/08/2018 1916   APPEARANCEUR CLEAR 04/08/2018 1916   LABSPEC 1.013 04/08/2018 1916   PHURINE 7.0 04/08/2018 1916   GLUCOSEU NEGATIVE 04/08/2018 1916   HGBUR NEGATIVE 04/08/2018 1916   BILIRUBINUR NEGATIVE 04/08/2018 1916   KETONESUR NEGATIVE 04/08/2018 1916   PROTEINUR NEGATIVE 04/08/2018 1916   NITRITE NEGATIVE 04/08/2018 1916   LEUKOCYTESUR TRACE (A) 04/08/2018 1916   Sepsis Labs Invalid input(s): PROCALCITONIN,  WBC,  LACTICIDVEN   Time coordinating discharge: 35 minutes  SIGNED:  Almon Hercules, MD  [facility] 04/09/2018, 12:35 PM Pager [phone]  If 7PM-7AM, please contact night-coverage [email or web address] Password [email or web address]

## 2018-04-09 NOTE — Progress Notes (Signed)
Notified Dr. Tenny Craw that pt is telling me that she has decided that she does want a pacemaker. Dr. Tenny Craw stated she would have EP see. Dr. Alanda Slim notified. Discharge pending EP decision. Cont to monitor. Emelda Brothers RN

## 2018-04-09 NOTE — Discharge Instructions (Addendum)
° ° °  Supplemental Discharge Instructions for  Pacemaker/Defibrillator Patients  Activity No heavy lifting or vigorous activity with your left/right arm for 6 to 8 weeks.  Do not raise your left/right arm above your head for one week.  Gradually raise your affected arm as drawn below.              04/14/2018                04/15/2018                04/16/2018               04/17/2018 __  NO DRIVING (patient does not drive)  WOUND CARE - Keep the wound area clean and dry.  Do not get this area wet, no showers until cleared to at your wound check visit . - The tape/steri-strips on your wound will fall off; do not pull them off.  No bandage is needed on the site.  DO  NOT apply any creams, oils, or ointments to the wound area. - If you notice any drainage or discharge from the wound, any swelling or bruising at the site, or you develop a fever > 101? F after you are discharged home, call the office at once.  Special Instructions - You are still able to use cellular telephones; use the ear opposite the side where you have your pacemaker/defibrillator.  Avoid carrying your cellular phone near your device. - When traveling through airports, show security personnel your identification card to avoid being screened in the metal detectors.  Ask the security personnel to use the hand wand. - Avoid arc welding equipment, MRI testing (magnetic resonance imaging), TENS units (transcutaneous nerve stimulators).  Call the office for questions about other devices. - Avoid electrical appliances that are in poor condition or are not properly grounded. - Microwave ovens are safe to be near or to operate.    It has been a pleasure taking care of you! You were admitted due to chest pain.  After the test this we have done, we do not this this is related to your heart.  We have increased you acid reflux medication to see if that helps. Please, make sure to read the directions before you take them. The names and  directions on how to take these medications are found on this discharge paper under medication section.  Please follow-up with your primary care doctor in 1 week.  Once you are discharged, your primary care physician will handle any further medical issues. Please note that NO REFILLS for any discharge medications will be authorized once you are discharged, as it is imperative that you return to your primary care physician (or establish a relationship with a primary care physician if you do not have one) for your aftercare needs so that they can reassess your need for medications and monitor your lab values. Take care,

## 2018-04-09 NOTE — Consult Note (Addendum)
Cardiology Consultation:   Patient ID: Jessica Hayes MRN: [id number]; DOB: Oct 07, 1945  Admit date: 04/08/2018 Date of Consult: 04/09/2018  Primary Care Provider: Wilmer Floor., MD Primary Cardiologist: Dr. Rennis Golden (new on 03/28/2018) Primary Electrophysiologist:  None    Patient Profile:   Jessica Hayes is a 73 y.o. female with a hx of Alzheimer's dementia, Parkinson's disease, hypothyroidism, HTN, HLD, resides at SNF, and very recently AFib who is being seen today for the evaluation of prolong pauses, ppm at the request of Dr. Tenny Craw.  History of Present Illness:   [name] was very recently here for AMS, UTI during this stay noted to have AFib ( a new diagnosis for her) with associate pauses as long as 11 seconds.  The patient declined PPM at that time, discharged 03/31/2018 off coreg, and ASA for CHA2DSVasc of one.  Discharge summary states that after further discussion with palliative care the patient and family did decide to pursue PPM, though off the coreg she was maintaining SR without further pauses and recs by cardiology were to hold off and follow off AV nodal drugs.  She is readmitted early this AM with c/o CP, found back in AFib rates 110's.  She has been seen buy cardiology service, her CP felt to have atypical features of coronary etiology, perhaps more pleuritic sounding, musculoskeletal, Trops were neg x3.  She had spontaneous conversion to SR today associated with post conversion pause of 6.49 sec a sinus beat and a 4.07 second pause to SR.    EP is called after the patient reported to the RN she would like to pursue pacer.  In d/w attending MD< Dr. Alanda Slim, there is some concern about the patient's ability to comprehend/make decisions.  In review of the palliative consult dated 03/30/2018, in conversation at that time with the patient, and 2 daughters Annabelle Harman and Cordelia Pen, it was decided that they were all agreeable to PPM, and the palliative NP felt the patient was able to speak  for herself and had capacity, was able to appreciate risks/benefits at that time.  LABS K+ 3.7 Mag 2.1 BUN/Creat 13/0.96 Trop I: <0.03 x3 WBC 8.7 H/H 13/44 Plts 210  TSH on 03/26/2018 was 0.996  The patient currently has no active complaints outside of sharp/poking fleeting pain center chest with deep inspiration.  This is what has caused her to re-consider PPM implant.  Also reports that she d/w some other people at the NH that told her they felt better after they had PPM put in.   She reports she lives in a NH, and has trouble with frequent falls.  She reports often has dizzy spells but does not think she faints.    Past Medical History:  Diagnosis Date  . Alzheimer disease (HCC)   . Anxiety   . Bipolar disorder, unspecified (HCC)   . Dementia (HCC)   . Depressive disorder   . GERD (gastroesophageal reflux disease)   . Hypercholesteremia   . Hypertension   . Insomnia   . Lumbar disc disease    degenerative  . Parkinson disease (HCC)   . Restless leg syndrome   . Thyroid disease    hypothyroid  . UTI (urinary tract infection)   . Weakness     History reviewed. No pertinent surgical history.   Home Medications:  Prior to Admission medications   Medication Sig Start Date End Date Taking? Authorizing Provider  aspirin EC 81 MG tablet Take 81 mg by mouth daily.   Yes [provider]  carbidopa-levodopa (SINEMET IR) 25-100 MG tablet Take 1 tablet by mouth 3 (three) times daily.   Yes [provider]  cyclobenzaprine (FLEXERIL) 5 MG tablet Take 5 mg by mouth at bedtime.   Yes [provider]  fenofibrate (TRICOR) 48 MG tablet Take 48 mg by mouth daily.   Yes [provider]  furosemide (LASIX) 20 MG tablet Take 1 tablet (20 mg total) by mouth as needed for edema. Patient taking differently: Take 20 mg by mouth daily as needed for edema.  03/31/18  Yes Roberto ScalesNettey, Shayla D, MD  LACTOBACILLUS PO Take 1 capsule by mouth daily.   Yes [provider]  levothyroxine (SYNTHROID, LEVOTHROID) 50 MCG tablet Take 50 mcg by mouth daily before breakfast.   Yes [provider]  OLANZapine (ZYPREXA) 15 MG tablet Take 15 mg by mouth at bedtime.   Yes [provider]  omeprazole (PRILOSEC) 20 MG capsule Take 20 mg by mouth every other day.   Yes [provider]  oxybutynin (DITROPAN-XL) 10 MG 24 hr tablet Take 10 mg by mouth at bedtime.   Yes [provider]  rOPINIRole (REQUIP) 0.5 MG tablet Take 0.5 mg by mouth at bedtime.   Yes [provider]  temazepam (RESTORIL) 7.5 MG capsule Take 7.5 mg by mouth at bedtime.   Yes [provider]  pantoprazole (PROTONIX) 40 MG tablet Take 1 tablet (40 mg total) by mouth daily. 04/10/18   Almon HerculesGonfa, Taye T, MD    Inpatient Medications: Scheduled Meds: . aspirin EC  81 mg Oral Daily  . carbidopa-levodopa  1 tablet Oral TID WC  . cyclobenzaprine  5 mg Oral QHS  . enoxaparin (LOVENOX) injection  40 mg Subcutaneous Daily  . fenofibrate  54 mg Oral Daily  . levothyroxine  50 mcg Oral QAC breakfast  . OLANZapine  15 mg Oral QHS  . oxybutynin  10 mg Oral QHS  . pantoprazole  40 mg Oral Daily  . rOPINIRole  0.5 mg Oral QHS  . temazepam  7.5 mg Oral QHS   Continuous Infusions:  PRN Meds: acetaminophen, ondansetron (ZOFRAN) IV  Allergies:    Allergies  Allergen Reactions  . Demerol [Meperidine Hcl] Other (See Comments)    Unknown reaction - [facility]    Social History:   Social History   Socioeconomic History  . Marital status: Widowed    Spouse name: Not on file  . Number of children: Not on file  . Years of education: Not on file  . Highest education level: Not on file  Occupational History  . Not on file  Social Needs  . Financial resource strain: Not on file  . Food insecurity:    Worry: Not on file    Inability: Not on file  . Transportation needs:    Medical: Not on file    Non-medical: Not on file  Tobacco Use  . Smoking  status: Unknown If Ever Smoked  . Smokeless tobacco: Never Used  Substance and Sexual Activity  . Alcohol use: Not Currently  . Drug use: Not Currently  . Sexual activity: Not Currently  Lifestyle  . Physical activity:    Days per week: Not on file    Minutes per session: Not on file  . Stress: Not on file  Relationships  . Social connections:    Talks on phone: Not on file    Gets together: Not on file    Attends religious service: Not on file  Active member of club or organization: Not on file    Attends meetings of clubs or organizations: Not on file    Relationship status: Not on file  . Intimate partner violence:    Fear of current or ex partner: Not on file    Emotionally abused: Not on file    Physically abused: Not on file    Forced sexual activity: Not on file  Other Topics Concern  . Not on file  Social History Narrative  . Not on file    Family History:   Family History  Problem Relation Age of Onset  . Diabetes Mother   . Heart disease Mother   . Heart disease Father      ROS:  Please see the history of present illness. All other ROS reviewed and negative.     Physical Exam/Data:   Vitals:   04/09/18 0115 04/09/18 0603 04/09/18 0741 04/09/18 1152  BP: 123/70 (!) 136/55 125/67 121/66  Pulse: 83 78 79 84  Resp: 18 16 15 15   Temp:  (!) 97.5 F (36.4 C) 97.6 F (36.4 C) (!) 97.5 F (36.4 C)  TempSrc:  Oral Oral Oral  SpO2: 97% 99% 98% 100%  Weight: 78.3 kg       Intake/Output Summary (Last 24 hours) at 04/09/2018 1431 Last data filed at 04/09/2018 1100 Gross per 24 hour  Intake -  Output 500 ml  Net -500 ml   Last 3 Weights 04/09/2018 03/26/2018 03/25/2018  Weight (lbs) 172 lb 9.9 oz 170 lb 6.7 oz 200 lb  Weight (kg) 78.3 kg 77.3 kg 90.719 kg     Body mass index is 29.63 kg/m.  General:  Well nourished, well developed, in no acute distress HEENT: normal Lymph: no adenopathy Neck: no JVD Endocrine:  No thryomegaly Vascular: No carotid  bruits Cardiac:  RRR; no murmurs, gallops or rubs Lungs:  CTA b/l, no wheezing, rhonchi or rales  Abd: soft, nontender Ext: no edema Musculoskeletal:  No deformities Skin: warm and dry  Neuro:  No gross focal abnormalities noted, she has a resting hand tremor b/l L>R Psych:  Normal affect, very pleasent   EKG:  The EKG was personally reviewed and demonstrates:    AFib 110bpm  03/25/2018 is SR, normal intervals  Telemetry:  Telemetry was personally reviewed and demonstrates:   AFib 100's > SR with 2 second pause > AFib w/CVR > SR with 6.49 and 4.07 second pauses, has maintained SR/SB 50's-60's since  Relevant CV Studies:   No historical cardiac data  Laboratory Data:  Chemistry Recent Labs  Lab 04/08/18 1754 04/09/18 0815  NA 139 139  K 4.0 3.7  CL 106 106  CO2 23 21*  GLUCOSE 123* 101*  BUN 14 13  CREATININE 1.09* 0.96  CALCIUM 9.1 9.1  GFRNONAA 50* 59*  GFRAA 58* >60  ANIONGAP 10 12    Recent Labs  Lab 04/08/18 1754  PROT 7.2  ALBUMIN 3.1*  AST 33  ALT 12  ALKPHOS 102  BILITOT 1.0   Hematology Recent Labs  Lab 04/08/18 1754  WBC 8.7  RBC 5.08  HGB 13.9  HCT 44.8  MCV 88.2  MCH 27.4  MCHC 31.0  RDW 14.6  PLT 210   Cardiac Enzymes Recent Labs  Lab 04/09/18 0019 04/09/18 0327 04/09/18 0633  TROPONINI <0.03 <0.03 <0.03    Recent Labs  Lab 04/08/18 1820  TROPIPOC 0.00    BNPNo results for input(s): BNP, PROBNP in the last 168 hours.  DDimer No results for input(s): DDIMER in the last 168 hours.  Radiology/Studies:   Ct Angio Chest Pe W And/or Wo Contrast Result Date: 04/08/2018 CLINICAL DATA:  Central chest pain x4 days with history of atrial fibrillation. EXAM: CT ANGIOGRAPHY CHEST WITH CONTRAST TECHNIQUE: Multidetector CT imaging of the chest was performed using the standard protocol during bolus administration of intravenous contrast. Multiplanar CT image reconstructions and MIPs were obtained to evaluate the vascular anatomy.  CONTRAST:  60mL ISOVUE-370 IOPAMIDOL (ISOVUE-370) INJECTION 76% COMPARISON:  04/25/2017 FINDINGS: Cardiovascular: Conventional branch pattern of the great vessels with atherosclerotic aorta. No thoracic aortic aneurysm or dissection. Satisfactory opacification of the pulmonary arteries to the segmental level without pulmonary embolus. Heart size is stable and within normal limits with coronary arteriosclerosis. Mediastinum/Nodes: No thyromegaly or mass. Patent trachea and mainstem bronchi. The CT appearance of the esophagus is unremarkable. No pathologically enlarged lymph nodes. Lungs/Pleura: Low lung volumes with bibasilar atelectasis and trace pleural effusions. Faint ground-glass opacities noted throughout both lungs likely reflect areas of hypoventilatory change. Right-sided pulmonary nodules measuring 4 mm or less are again noted, some of which appear calcified, series 7, image 68, 63, 46 and 24. Upper Abdomen: Status post cholecystectomy. No acute abnormality within the upper abdomen. Musculoskeletal: No chest wall abnormality. No acute or significant osseous findings. Review of the MIP images confirms the above findings. IMPRESSION: 1. No acute pulmonary embolus, aortic aneurysm or dissection. 2. Stable right-sided 4 mm or less pulmonary nodules. Aortic Atherosclerosis (ICD10-I70.0). Electronically Signed   By: Tollie Eth M.D.   On: 04/08/2018 22:51    Dg Chest Port 1 View Result Date: 04/08/2018 CLINICAL DATA:  73 year old female with chest pains for the past 4 days, increased shortness of breath EXAM: PORTABLE CHEST 1 VIEW COMPARISON:  Prior chest x-ray 03/25/2018 FINDINGS: The lungs are clear and negative for focal airspace consolidation, pulmonary edema or suspicious pulmonary nodule. No pleural effusion or pneumothorax. Cardiac and mediastinal contours are within normal limits. No acute fracture or lytic or blastic osseous lesions. Calcifications are present in the transverse aorta. The visualized  upper abdominal bowel gas pattern is unremarkable. IMPRESSION: No active disease. Electronically Signed   By: Malachy Moan M.D.   On: 04/08/2018 18:00    Assessment and Plan:   1. Paroxysmal AFib     CHA2DS2Vasc is 3, not on a/c  She has Parkinson's with reported frequent falls, felt too high risk for a/c   2. Marked post termination pauses     As long as 11 seconds by record from her last hospital stay, and 6.49 followed by 4.07 seconds     Not clear if symptomatic, she reports dizzy spells at home (SNF)      Off coreg since 03/31/2018, I do not see any other potential nodal blocking/rate limiting agents  Echo ordered  The patient is AAO to self, time, and to some degree situation.  She was unable to independently tell me she was in the hospital, though once re-oriented, reports she came because of CP, and herself went into a discussion about pacemaker need. We discussed rational/indication for PPM, the procedure and it's risks/benefits.  I also discussed with her that with her particular pacemaker implant did not think she would not any change in how she feels at baseline, with a number of non-related diagnosis' that would make her feel generally fatigued, tired not associated with her heart rhythm/pauses.  She reported understanding She tells me that she signs her own paperwork and makes  her own decision, though did ask me to reach out to her daughter [name] and discuss with her, would like her daughter involved/here with her if planned for.  I spoke with Annabelle Harmanana via telephone.  We revisited ppm rational, long pauses.  She was under the impression her mom was going home today, informed that her mom reached out to RN about getting pacer in after all.  She is agreeable but would as well prefer to be here and unable to get her today.  In discussion with the patient and daughter, Jaben Benegas hold off to tomorrow for PPM implant.  I Latoshia Monrroy place on tomorrow's schedule and we Anarosa Kubisiak revisit in AM.       For questions or updates, please contact [facility] Please consult [email or web address] for contact info under     Signed, Sheilah PigeonRenee Lynn Ursuy, PA-C  04/09/2018 2:31 PM  I have seen and examined this patient with Francis Dowseenee Ursuy.  Agree with above, note added to reflect my findings.  On exam, RRR, no murmurs, lungs clear. Patient with atrial fibrillation. She has had multiple episodes of AF with post termination pauses of up to 11 seconds despite holding rate controlling medications. Due to that, would plan for pacemaker implant. Risks and benefits discussed and the patient's daughter has agreed. The patient has Alzheimer's Dementia and cannot consent. This was discussed with the patients daughter Woody SellerDana Beckham.    Makylee Sanborn M. Eliaz Fout MD 04/10/2018 7:44 AM

## 2018-04-09 NOTE — Progress Notes (Signed)
PROGRESS NOTE  Jessica Hayes ZOX:[id number] DOB: Jan 11, 1946 DOA: 04/08/2018 PCP: Jessica Floorampbell, Stephen D., MD   LOS: 0 days   HPI: Per Dr. Estanislado EmmsGardner Rio A Bearsis a 73 y.o.femalewith medical history significant ofAlzheimer's disease, parkinson's dz, HTN, BPD. Patient resides at SNF at baseline due to advanced dementia. Patient admitted earlier this month for encephalopathy believed to be due to UTI. MRI was negative for stroke findings at that time. Found to be in A.Fib. Initially started on beta blocker for rate control however had significant sinus pauses with this so beta blocker stopped.  She presents to the ED today with report of 4 day history of CP. Midsternal discomfort. Continuously present patient reports. No associated N/V, SOB. No radiation.  Hospital Course: Patient with history as above admitted with chest pain.  Serial troponin and EKG negative for acute ischemic finding.  CTA chest without PE or pneumonia.  She had atrial fibrillation with RVR to 110 on admission.  She converted to sinus rhythm spontaneously.  She also had a 7-second pause on telemetry and converted back to sinus rhythm.  As a result of this, cardiology did not feel it is safe to start her on nodal blocking agents.  Cardiology also recommended against anticoagulation.  Cardiology did not feel her chest pain is of cardiac etiology and signed off. I have discussed patient's condition and discharge plan with patient's daughter, Ms. Woody SellerDana Hayes over the phone.  Increased her PPI dose and discharged home back to SNF.  Subjective: Reports pain over epigastric area.  Not able to characterize the pain.  States that pain has improved. Had some sinus pause for 7 seconds early this morning.  Denies palpitation or dyspnea.  Denies nausea or vomiting.  Of note, patient has significant dementia.   Assessment & Plan: Principal Problem:   Chest pain, rule out acute myocardial infarction Active Problems:  Alzheimer's dementia (HCC)   Essential hypertension   Persistent atrial fibrillation  Atypical chest pain: Serial troponin and EKG negative for acute ischemic finding.  Allergy signed off. -Increase her PPI to 40 mg -Continue monitoring  Paroxysmal atrial fibrillation/sinus pause for 7 seconds: Not on nodal blocking agents.  Not on anticoagulation per cardiology.  Initially discharged after cardiology signed off.  However, patient voiced interest in pacemaker.  She declined this in the past. EP consulted and discussed pacemaker with patient and patient's daughter over the phone. -Planning PPM implant tomorrow. -Follow echocardiogram  GERD -Increase PPI as above  Hypothyroidism: -Continue home Synthroid  Alzheimer's dementia: Alert and oriented x4 this morning. -Continue home meds  Parkinson disease: Resting tremor in both arms -Continue home meds  Hypertension: Normotensive -Continue home meds  Restless leg syndrome  Mild AKI: Resolved -Recheck BMP in the morning  Scheduled Meds: . aspirin EC  81 mg Oral Daily  . carbidopa-levodopa  1 tablet Oral TID WC  . cyclobenzaprine  5 mg Oral QHS  . enoxaparin (LOVENOX) injection  40 mg Subcutaneous Daily  . fenofibrate  54 mg Oral Daily  . levothyroxine  50 mcg Oral QAC breakfast  . OLANZapine  15 mg Oral QHS  . oxybutynin  10 mg Oral QHS  . pantoprazole  40 mg Oral Daily  . rOPINIRole  0.5 mg Oral QHS  . temazepam  7.5 mg Oral QHS   Continuous Infusions: PRN Meds:.acetaminophen, ondansetron (ZOFRAN) IV  DVT prophylaxis: Lovenox Code Status: DNR Family Communication: Discussed with patient's daughter Disposition Plan: Change status to inpatient  Consultants:   Cardiology  EP  Procedures:   Planned PPM. placement tomorrow  Antimicrobials:  none   Objective: Vitals:   04/09/18 0115 04/09/18 0603 04/09/18 0741 04/09/18 1152  BP: 123/70 (!) 136/55 125/67 121/66  Pulse: 83 78 79 84  Resp: 18 16 15 15   Temp:   (!) 97.5 F (36.4 C) 97.6 F (36.4 C) (!) 97.5 F (36.4 C)  TempSrc:  Oral Oral Oral  SpO2: 97% 99% 98% 100%  Weight: 78.3 kg       Intake/Output Summary (Last 24 hours) at 04/09/2018 1527 Last data filed at 04/09/2018 1100 Gross per 24 hour  Intake -  Output 500 ml  Net -500 ml   Filed Weights   04/09/18 0115  Weight: 78.3 kg    Examination:  GENERAL: Appears well. No acute distress.  HEENT: MMM.  Vision and Hearing grossly intact.  NECK: Supple.  No JVD.  LUNGS:  No IWOB. Good air movement. CTAB.  HEART:  RRR. Heart sounds normal.  ABD: Bowel sounds present. Soft. Non tender.  MSK/EXT:   no edema bilaterally.  SKIN: no apparent skin lesion.  NEURO: Awake, alert and oriented x4.  Initially told she was in SNF but recovered quickly.  Resting tremor in both arms. PSYCH: Calm. Normal affect.  Data Reviewed: I have independently reviewed following labs and imaging studies   CBC: Recent Labs  Lab 04/08/18 1754  WBC 8.7  NEUTROABS 5.1  HGB 13.9  HCT 44.8  MCV 88.2  PLT 210   Basic Metabolic Panel: Recent Labs  Lab 04/08/18 1754 04/09/18 0815  NA 139 139  K 4.0 3.7  CL 106 106  CO2 23 21*  GLUCOSE 123* 101*  BUN 14 13  CREATININE 1.09* 0.96  CALCIUM 9.1 9.1  MG  --  2.1   GFR: Estimated Creatinine Clearance: 52.8 mL/min (by C-G formula based on SCr of 0.96 mg/dL). Liver Function Tests: Recent Labs  Lab 04/08/18 1754  AST 33  ALT 12  ALKPHOS 102  BILITOT 1.0  PROT 7.2  ALBUMIN 3.1*   Recent Labs  Lab 04/08/18 1754  LIPASE 27   No results for input(s): AMMONIA in the last 168 hours. Coagulation Profile: No results for input(s): INR, PROTIME in the last 168 hours. Cardiac Enzymes: Recent Labs  Lab 04/09/18 0019 04/09/18 0327 04/09/18 0633  TROPONINI <0.03 <0.03 <0.03   BNP (last 3 results) No results for input(s): PROBNP in the last 8760 hours. HbA1C: No results for input(s): HGBA1C in the last 72 hours. CBG: No results for  input(s): GLUCAP in the last 168 hours. Lipid Profile: No results for input(s): CHOL, HDL, LDLCALC, TRIG, CHOLHDL, LDLDIRECT in the last 72 hours. Thyroid Function Tests: No results for input(s): TSH, T4TOTAL, FREET4, T3FREE, THYROIDAB in the last 72 hours. Anemia Panel: No results for input(s): VITAMINB12, FOLATE, FERRITIN, TIBC, IRON, RETICCTPCT in the last 72 hours. Urine analysis:    Component Value Date/Time   COLORURINE YELLOW 04/08/2018 1916   APPEARANCEUR CLEAR 04/08/2018 1916   LABSPEC 1.013 04/08/2018 1916   PHURINE 7.0 04/08/2018 1916   GLUCOSEU NEGATIVE 04/08/2018 1916   HGBUR NEGATIVE 04/08/2018 1916   BILIRUBINUR NEGATIVE 04/08/2018 1916   KETONESUR NEGATIVE 04/08/2018 1916   PROTEINUR NEGATIVE 04/08/2018 1916   NITRITE NEGATIVE 04/08/2018 1916   LEUKOCYTESUR TRACE (A) 04/08/2018 1916   Sepsis Labs: Invalid input(s): PROCALCITONIN, LACTICIDVEN  No results found for this or any previous visit (from the past 240 hour(s)).    Radiology Studies: Ct Angio Chest  Pe W And/or Wo Contrast  Result Date: 04/08/2018 CLINICAL DATA:  Central chest pain x4 days with history of atrial fibrillation. EXAM: CT ANGIOGRAPHY CHEST WITH CONTRAST TECHNIQUE: Multidetector CT imaging of the chest was performed using the standard protocol during bolus administration of intravenous contrast. Multiplanar CT image reconstructions and MIPs were obtained to evaluate the vascular anatomy. CONTRAST:  60mL ISOVUE-370 IOPAMIDOL (ISOVUE-370) INJECTION 76% COMPARISON:  04/25/2017 FINDINGS: Cardiovascular: Conventional branch pattern of the great vessels with atherosclerotic aorta. No thoracic aortic aneurysm or dissection. Satisfactory opacification of the pulmonary arteries to the segmental level without pulmonary embolus. Heart size is stable and within normal limits with coronary arteriosclerosis. Mediastinum/Nodes: No thyromegaly or mass. Patent trachea and mainstem bronchi. The CT appearance of the  esophagus is unremarkable. No pathologically enlarged lymph nodes. Lungs/Pleura: Low lung volumes with bibasilar atelectasis and trace pleural effusions. Faint ground-glass opacities noted throughout both lungs likely reflect areas of hypoventilatory change. Right-sided pulmonary nodules measuring 4 mm or less are again noted, some of which appear calcified, series 7, image 68, 63, 46 and 24. Upper Abdomen: Status post cholecystectomy. No acute abnormality within the upper abdomen. Musculoskeletal: No chest wall abnormality. No acute or significant osseous findings. Review of the MIP images confirms the above findings. IMPRESSION: 1. No acute pulmonary embolus, aortic aneurysm or dissection. 2. Stable right-sided 4 mm or less pulmonary nodules. Aortic Atherosclerosis (ICD10-I70.0). Electronically Signed   By: Tollie Ethavid  Kwon M.D.   On: 04/08/2018 22:51   Dg Chest Port 1 View  Result Date: 04/08/2018 CLINICAL DATA:  73 year old female with chest pains for the past 4 days, increased shortness of breath EXAM: PORTABLE CHEST 1 VIEW COMPARISON:  Prior chest x-ray 03/25/2018 FINDINGS: The lungs are clear and negative for focal airspace consolidation, pulmonary edema or suspicious pulmonary nodule. No pleural effusion or pneumothorax. Cardiac and mediastinal contours are within normal limits. No acute fracture or lytic or blastic osseous lesions. Calcifications are present in the transverse aorta. The visualized upper abdominal bowel gas pattern is unremarkable. IMPRESSION: No active disease. Electronically Signed   By: Malachy MoanHeath  McCullough M.D.   On: 04/08/2018 18:00       T. [facility] Pager [phone]  If 7PM-7AM, please contact night-coverage [email or web address] Password [id number] 04/09/2018, 3:27 PM

## 2018-04-09 NOTE — Progress Notes (Signed)
  Echocardiogram 2D Echocardiogram has been performed.  Janalyn HarderWest, Cashus Halterman R 04/09/2018, 3:47 PM

## 2018-04-09 NOTE — H&P (Signed)
History and Physical    Jessica HumphreyCarolyn A Halley ONG:[id number] DOB: [birth year] DOA: 04/08/2018  PCP: [name], Stephen D., MD  Patient coming from: SNF  I have personally briefly reviewed patient's old medical records in [facility]  Chief Complaint: CP  HPI: Jessica Hayes is a 73 y.o. female with medical history significant of Alzheimer's disease, parkinson's dz, HTN, BPD.  Patient resides at SNF at baseline due to advanced dementia.  Patient admitted earlier this month for encephalopathy believed to be due to UTI.  MRI was negative for stroke findings at that time.  Found to be in A.Fib.  Initially started on beta blocker for rate control however had significant sinus pauses with this so beta blocker stopped.  She presents to the ED today with report of 4 day history of CP.  Midsternal discomfort.  Continuously present patient reports.  No associated N/V, SOB.  No radiation.   ED Course: Trop neg, EKG shows A.Fib with rate 110.   Review of Systems: 12 systems reviewed as per HPI otherwise negative.  Past Medical History:  Diagnosis Date  . Alzheimer disease (HCC)   . Anxiety   . Bipolar disorder, unspecified (HCC)   . Dementia (HCC)   . Depressive disorder   . GERD (gastroesophageal reflux disease)   . Hypercholesteremia   . Hypertension   . Insomnia   . Lumbar disc disease    degenerative  . Parkinson disease (HCC)   . Restless leg syndrome   . Thyroid disease    hypothyroid  . UTI (urinary tract infection)   . Weakness     History reviewed. No pertinent surgical history.   has an unknown smoking status. She has never used smokeless tobacco. She reports previous alcohol use. She reports previous drug use.  Allergies  Allergen Reactions  . Demerol [Meperidine Hcl] Other (See Comments)    [facility]    History reviewed. No pertinent family history.  Prior to Admission medications   Medication Sig Start Date End Date Taking? Authorizing  Provider  aspirin EC 81 MG tablet Take 81 mg by mouth daily.   Yes [provider]  carbidopa-levodopa (SINEMET IR) 25-100 MG tablet Take 1 tablet by mouth 3 (three) times daily.   Yes [provider]  cyclobenzaprine (FLEXERIL) 5 MG tablet Take 5 mg by mouth at bedtime.   Yes [provider]  fenofibrate (TRICOR) 48 MG tablet Take 48 mg by mouth daily.   Yes [provider]  furosemide (LASIX) 20 MG tablet Take 1 tablet (20 mg total) by mouth as needed for edema. Patient taking differently: Take 20 mg by mouth daily as needed for edema.  03/31/18  Yes Roberto ScalesNettey, Shayla D, MD  LACTOBACILLUS PO Take 1 capsule by mouth daily.   Yes [provider]  levothyroxine (SYNTHROID, LEVOTHROID) 50 MCG tablet Take 50 mcg by mouth daily before breakfast.   Yes [provider]  OLANZapine (ZYPREXA) 15 MG tablet Take 15 mg by mouth at bedtime.   Yes [provider]  omeprazole (PRILOSEC) 20 MG capsule Take 20 mg by mouth every other day.   Yes [provider]  oxybutynin (DITROPAN-XL) 10 MG 24 hr tablet Take 10 mg by mouth at bedtime.   Yes [provider]  rOPINIRole (REQUIP) 0.5 MG tablet Take 0.5 mg by mouth at bedtime.   Yes [provider]  temazepam (RESTORIL) 7.5 MG capsule Take 7.5 mg by mouth at bedtime.   Yes  [provider]    Physical Exam: Vitals:   04/08/18 2230 04/08/18 2300 04/08/18 2330 04/09/18 0000  BP:      Pulse: 91 92 89 86  Resp: (!) 22 15 15 17   Temp:      TempSrc:      SpO2: 100% 100% 100% 100%    Constitutional: NAD, calm, comfortable Eyes: PERRL, lids and conjunctivae normal ENMT: Mucous membranes are moist. Posterior pharynx clear of any exudate or lesions.Normal dentition.  Neck: normal, supple, no masses, no thyromegaly Respiratory: clear to auscultation bilaterally, no wheezing, no crackles. Normal respiratory effort. No accessory muscle use.  Cardiovascular: irr, irr,  slightly tachycardic. Abdomen: no tenderness, no masses palpated. No hepatosplenomegaly. Bowel sounds positive.  Musculoskeletal: no clubbing / cyanosis. No joint deformity upper and lower extremities. Good ROM, no contractures. Normal muscle tone.  Skin: no rashes, lesions, ulcers. No induration Neurologic: CN 2-12 grossly intact. Sensation intact, DTR normal. Strength 5/5 in all 4. Parkinson tremor of BUE.  Difficult to understand speech. Psychiatric: Oriented, difficult to understand speech.   Labs on Admission: I have personally reviewed following labs and imaging studies  CBC: Recent Labs  Lab 04/08/18 1754  WBC 8.7  NEUTROABS 5.1  HGB 13.9  HCT 44.8  MCV 88.2  PLT 210   Basic Metabolic Panel: Recent Labs  Lab 04/08/18 1754  NA 139  K 4.0  CL 106  CO2 23  GLUCOSE 123*  BUN 14  CREATININE 1.09*  CALCIUM 9.1   GFR: Estimated Creatinine Clearance: 46.2 mL/min (A) (by C-G formula based on SCr of 1.09 mg/dL (H)). Liver Function Tests: Recent Labs  Lab 04/08/18 1754  AST 33  ALT 12  ALKPHOS 102  BILITOT 1.0  PROT 7.2  ALBUMIN 3.1*   Recent Labs  Lab 04/08/18 1754  LIPASE 27   No results for input(s): AMMONIA in the last 168 hours. Coagulation Profile: No results for input(s): INR, PROTIME in the last 168 hours. Cardiac Enzymes: No results for input(s): CKTOTAL, CKMB, CKMBINDEX, TROPONINI in the last 168 hours. BNP (last 3 results) No results for input(s): PROBNP in the last 8760 hours. HbA1C: No results for input(s): HGBA1C in the last 72 hours. CBG: No results for input(s): GLUCAP in the last 168 hours. Lipid Profile: No results for input(s): CHOL, HDL, LDLCALC, TRIG, CHOLHDL, LDLDIRECT in the last 72 hours. Thyroid Function Tests: No results for input(s): TSH, T4TOTAL, FREET4, T3FREE, THYROIDAB in the last 72 hours. Anemia Panel: No results for input(s): VITAMINB12, FOLATE, FERRITIN, TIBC, IRON, RETICCTPCT in the last 72 hours. Urine analysis:      Component Value Date/Time   COLORURINE YELLOW 04/08/2018 1916   APPEARANCEUR CLEAR 04/08/2018 1916   LABSPEC 1.013 04/08/2018 1916   PHURINE 7.0 04/08/2018 1916   GLUCOSEU NEGATIVE 04/08/2018 1916   HGBUR NEGATIVE 04/08/2018 1916   BILIRUBINUR NEGATIVE 04/08/2018 1916   KETONESUR NEGATIVE 04/08/2018 1916   PROTEINUR NEGATIVE 04/08/2018 1916   NITRITE NEGATIVE 04/08/2018 1916   LEUKOCYTESUR TRACE (A) 04/08/2018 1916    Radiological Exams on Admission: Ct Angio Chest Pe W And/or Wo Contrast  Result Date: 04/08/2018 CLINICAL DATA:  Central chest pain x4 days with history of atrial fibrillation. EXAM: CT ANGIOGRAPHY CHEST WITH CONTRAST TECHNIQUE: Multidetector CT imaging of the chest was performed using the standard protocol during bolus administration of intravenous contrast. Multiplanar CT image reconstructions and MIPs were obtained to evaluate the vascular anatomy. CONTRAST:  60mL ISOVUE-370 IOPAMIDOL (ISOVUE-370) INJECTION 76% COMPARISON:  04/25/2017 FINDINGS:  Cardiovascular: Conventional branch pattern of the great vessels with atherosclerotic aorta. No thoracic aortic aneurysm or dissection. Satisfactory opacification of the pulmonary arteries to the segmental level without pulmonary embolus. Heart size is stable and within normal limits with coronary arteriosclerosis. Mediastinum/Nodes: No thyromegaly or mass. Patent trachea and mainstem bronchi. The CT appearance of the esophagus is unremarkable. No pathologically enlarged lymph nodes. Lungs/Pleura: Low lung volumes with bibasilar atelectasis and trace pleural effusions. Faint ground-glass opacities noted throughout both lungs likely reflect areas of hypoventilatory change. Right-sided pulmonary nodules measuring 4 mm or less are again noted, some of which appear calcified, series 7, image 68, 63, 46 and 24. Upper Abdomen: Status post cholecystectomy. No acute abnormality within the upper abdomen. Musculoskeletal: No chest wall abnormality.  No acute or significant osseous findings. Review of the MIP images confirms the above findings. IMPRESSION: 1. No acute pulmonary embolus, aortic aneurysm or dissection. 2. Stable right-sided 4 mm or less pulmonary nodules. Aortic Atherosclerosis (ICD10-I70.0). Electronically Signed   By: Tollie Eth M.D.   On: 04/08/2018 22:51   Dg Chest Port 1 View  Result Date: 04/08/2018 CLINICAL DATA:  73 year old female with chest pains for the past 4 days, increased shortness of breath EXAM: PORTABLE CHEST 1 VIEW COMPARISON:  Prior chest x-ray 03/25/2018 FINDINGS: The lungs are clear and negative for focal airspace consolidation, pulmonary edema or suspicious pulmonary nodule. No pleural effusion or pneumothorax. Cardiac and mediastinal contours are within normal limits. No acute fracture or lytic or blastic osseous lesions. Calcifications are present in the transverse aorta. The visualized upper abdominal bowel gas pattern is unremarkable. IMPRESSION: No active disease. Electronically Signed   By: Malachy Moan M.D.   On: 04/08/2018 18:00    EKG: Independently reviewed.  Assessment/Plan Principal Problem:   Chest pain, rule out acute myocardial infarction Active Problems:   Alzheimer's dementia (HCC)   Essential hypertension   Persistent atrial fibrillation    1. CP r/o - 1. Serial trops 2. Tele monitor 3. Will let patient eat 4. Message sent to [facility] for cards eval in AM 5. CP improved at this time per patient 2. Alzheimer's - 1. Cont home meds 3. HTN - Continue BP meds 4. PAF - 1. Tele monitor 2. Current rate 110 3. No rate control meds due to sinus pauses with beta blocker last admit.  DVT prophylaxis: Lovenox Code Status: DNR, per info from SNF, also was DNR last admit Family Communication: No family in room Disposition Plan: SNF Consults called: Message Admission status: Place in obs    Hillary Bow. DO [facility] Pager [phone] Only works nights!  If  7AM-7PM, please contact the primary day team physician taking care of patient  [email or web address] Password [facility]  04/09/2018, 12:03 AM

## 2018-04-09 NOTE — Consult Note (Addendum)
Cardiology Consult    Patient ID: Jessica Hayes MRN: [id number], DOB/AGE: 1945/06/29   Admit date: 04/08/2018 Date of Consult: 04/09/2018  Primary Physician: [name], Stephen D., MD Primary Cardiologist: No primary care provider on file. Requesting Provider: Candelaria StagersGonfa, Taye, MD.  Patient Profile    Jessica HumphreyCarolyn A Hayes is a 73 y.o. female with a history of hypertension, Alzheimer's disease, Parkinson's disease, and bipolar disorder, who is being seen today for the evaluation of chest pain at the request of Dr. Alanda SlimGonfa.  History of Present Illness    Ms. Jessica Hayes is a 73 year old female with advanced dementia and lives in a skilled nursing facility. She was admitted from 03/25/2018 to 03/31/2018 for acute change in mental status with worsening weakness and aphasia. She was found to have a UTI. She had paroxysmal atrial fibrillation with sinus pauses lasting >10 seconds during hospitalization but spontaneously converted back to normal sinus prior to discharge. Cardiology saw patient during that admission and discussed need for pacemaker but patient declined. Attempt was made to contact patient's daughter but unsuccessful. Patient expressed desire to be DNR and did not want anymore invasive procedures.   [facility] for evaluation of chest discomfort.  Patient reports constant substernal chest pain for the last 4-5 days prior to presentation. Patient describes the pain as a sharp pain and reports some shortness of breath, palpitations, and lightheadedness at times. Pain finally improved some after arriving at the hospital but patient continues to report some chest tightness that is worse with deep inspiration. Patient also notes some orthopnea and PND occasionally. History and ROS limited by patients advanced dementia.  In the ED: EKG showed atrial fibrillation with ventricular rate of 110 bpm.Troponin negative x3. Chest x-ray showed no acute findings. Chest CTA showed no acute  pulmonary embolism, aortic aneurysm, or dissection. CBC unremarkable. Na 139, K 4.0, Glucose 123, SCr 1.09. Patient was admitted for further evaluation.   Past Medical History   Past Medical History:  Diagnosis Date  . Alzheimer disease (HCC)   . Anxiety   . Bipolar disorder, unspecified (HCC)   . Dementia (HCC)   . Depressive disorder   . GERD (gastroesophageal reflux disease)   . Hypercholesteremia   . Hypertension   . Insomnia   . Lumbar disc disease    degenerative  . Parkinson disease (HCC)   . Restless leg syndrome   . Thyroid disease    hypothyroid  . UTI (urinary tract infection)   . Weakness     History reviewed. No pertinent surgical history.   Allergies  Allergies  Allergen Reactions  . Demerol [Meperidine Hcl] Other (See Comments)    [facility] - [facility]    Inpatient Medications    . aspirin EC  81 mg Oral Daily  . carbidopa-levodopa  1 tablet Oral TID WC  . cyclobenzaprine  5 mg Oral QHS  . enoxaparin (LOVENOX) injection  40 mg Subcutaneous Daily  . fenofibrate  54 mg Oral Daily  . levothyroxine  50 mcg Oral QAC breakfast  . OLANZapine  15 mg Oral QHS  . oxybutynin  10 mg Oral QHS  . pantoprazole  40 mg Oral Daily  . rOPINIRole  0.5 mg Oral QHS  . temazepam  7.5 mg Oral QHS    Family History    History reviewed. No pertinent family history. has no family status information on file.   ** Unable to obtain due to patient's advanced dementia.  Social  History    Social History   Socioeconomic History  . Marital status: Widowed    Spouse name: Not on file  . Number of children: Not on file  . Years of education: Not on file  . Highest education level: Not on file  Occupational History  . Not on file  Social Needs  . Financial resource strain: Not on file  . Food insecurity:    Worry: Not on file    Inability: Not on file  . Transportation needs:    Medical: Not on file    Non-medical: Not on file  Tobacco Use  . Smoking status:  Unknown If Ever Smoked  . Smokeless tobacco: Never Used  Substance and Sexual Activity  . Alcohol use: Not Currently  . Drug use: Not Currently  . Sexual activity: Not Currently  Lifestyle  . Physical activity:    Days per week: Not on file    Minutes per session: Not on file  . Stress: Not on file  Relationships  . Social connections:    Talks on phone: Not on file    Gets together: Not on file    Attends religious service: Not on file    Active member of club or organization: Not on file    Attends meetings of clubs or organizations: Not on file    Relationship status: Not on file  . Intimate partner violence:    Fear of current or ex partner: Not on file    Emotionally abused: Not on file    Physically abused: Not on file    Forced sexual activity: Not on file  Other Topics Concern  . Not on file  Social History Narrative  . Not on file     Review of Systems    ROS limited by patient's advanced dementia. See HPI.  Physical Exam    Blood pressure 125/67, pulse 79, temperature 97.6 F (36.4 C), temperature source Oral, resp. rate 15, weight 78.3 kg, SpO2 98 %.  General: Elderly Caucasian female resting comfortably in no acute distress. Pleasant and cooperative. HEENT: Normal. Neck: Supple. No bruits or JVD appreciated. Lungs: No increased work of breathing. Clear to auscultation bilaterally. No wheezes, rhonchi, or rales. Heart: RRR.  No significant murmurs, gallops, or rubs.  Abdomen: Soft, non-distended, and non-tender to palpation. Bowel sounds present.  Extremities: No significant lower extremity edema. Radial and distal pedal pulses 2+ and equal bilaterally. Neuro: Alert and oriented to name, birthday, and city. No focal deficits. Tremor of bilateral upper extremities noted. Psych: Normal affect.  Labs    Troponin [facility](Point of Care Test) Recent Labs    04/08/18 1820  TROPIPOC 0.00   Recent Labs    04/09/18 0019 04/09/18 0327 04/09/18 0633  TROPONINI  <0.03 <0.03 <0.03   Lab Results  Component Value Date   WBC 8.7 04/08/2018   HGB 13.9 04/08/2018   HCT 44.8 04/08/2018   MCV 88.2 04/08/2018   PLT 210 04/08/2018    Recent Labs  Lab 04/08/18 1754 04/09/18 0815  NA 139 139  K 4.0 3.7  CL 106 106  CO2 23 21*  BUN 14 13  CREATININE 1.09* 0.96  CALCIUM 9.1 9.1  PROT 7.2  --   BILITOT 1.0  --   ALKPHOS 102  --   ALT 12  --   AST 33  --   GLUCOSE 123* 101*   No results found for: CHOL, HDL, LDLCALC, TRIG No results found for: DDIMER  Radiology Studies    Dg Chest 2 View  Result Date: 03/25/2018 CLINICAL DATA:  Altered mental status EXAM: CHEST - 2 VIEW COMPARISON:  None. FINDINGS: Heart size and vascularity normal. Mild bibasilar atelectasis. Negative for effusion or mass. No acute skeletal abnormality. IMPRESSION: Mild bibasilar atelectasis. Electronically Signed   By: Marlan Palau M.D.   On: 03/25/2018 13:42   Ct Head Wo Contrast  Result Date: 03/25/2018 CLINICAL DATA:  Altered level of consciousness. EXAM: CT HEAD WITHOUT CONTRAST TECHNIQUE: Contiguous axial images were obtained from the base of the skull through the vertex without intravenous contrast. COMPARISON:  None. FINDINGS: Brain: No evidence of acute infarction, hemorrhage, hydrocephalus, extra-axial collection or mass lesion/mass effect. There is mild diffuse low-attenuation within the subcortical and periventricular white matter compatible with chronic microvascular disease. Vascular: No hyperdense vessel or unexpected calcification. Skull: Normal. Negative for fracture or focal lesion. Sinuses/Orbits: No acute finding. Other: None IMPRESSION: 1. No acute intracranial abnormalities. 2. Chronic small vessel ischemic change. Electronically Signed   By: Signa Kell M.D.   On: 03/25/2018 14:42   Ct Angio Chest Pe W And/or Wo Contrast  Result Date: 04/08/2018 CLINICAL DATA:  Central chest pain x4 days with history of atrial fibrillation. EXAM: CT ANGIOGRAPHY CHEST  WITH CONTRAST TECHNIQUE: Multidetector CT imaging of the chest was performed using the standard protocol during bolus administration of intravenous contrast. Multiplanar CT image reconstructions and MIPs were obtained to evaluate the vascular anatomy. CONTRAST:  32mL ISOVUE-370 IOPAMIDOL (ISOVUE-370) INJECTION 76% COMPARISON:  04/25/2017 FINDINGS: Cardiovascular: Conventional branch pattern of the great vessels with atherosclerotic aorta. No thoracic aortic aneurysm or dissection. Satisfactory opacification of the pulmonary arteries to the segmental level without pulmonary embolus. Heart size is stable and within normal limits with coronary arteriosclerosis. Mediastinum/Nodes: No thyromegaly or mass. Patent trachea and mainstem bronchi. The CT appearance of the esophagus is unremarkable. No pathologically enlarged lymph nodes. Lungs/Pleura: Low lung volumes with bibasilar atelectasis and trace pleural effusions. Faint ground-glass opacities noted throughout both lungs likely reflect areas of hypoventilatory change. Right-sided pulmonary nodules measuring 4 mm or less are again noted, some of which appear calcified, series 7, image 68, 63, 46 and 24. Upper Abdomen: Status post cholecystectomy. No acute abnormality within the upper abdomen. Musculoskeletal: No chest wall abnormality. No acute or significant osseous findings. Review of the MIP images confirms the above findings. IMPRESSION: 1. No acute pulmonary embolus, aortic aneurysm or dissection. 2. Stable right-sided 4 mm or less pulmonary nodules. Aortic Atherosclerosis (ICD10-I70.0). Electronically Signed   By: Tollie Eth M.D.   On: 04/08/2018 22:51   Mr Brain Wo Contrast  Result Date: 03/26/2018 CLINICAL DATA:  Focal neuro deficit.  Altered mental status. EXAM: MRI HEAD WITHOUT CONTRAST TECHNIQUE: Multiplanar, multiecho pulse sequences of the brain and surrounding structures were obtained without intravenous contrast. COMPARISON:  Head CT from yesterday  FINDINGS: Brain: No acute infarction, hemorrhage, hydrocephalus, extra-axial collection or mass lesion. History of dementia but no notable atrophy. Minor periventricular chronic small vessel ischemia. Vascular: Major flow voids are preserved Skull and upper cervical spine: Negative for marrow lesion. Sinuses/Orbits: Negative Other: Significant and progressive motion degradation. IMPRESSION: Significantly motion degraded study that is otherwise unremarkable for age. Electronically Signed   By: Marnee Spring M.D.   On: 03/26/2018 05:00   Dg Chest Port 1 View  Result Date: 04/08/2018 CLINICAL DATA:  73 year old female with chest pains for the past 4 days, increased shortness of breath EXAM: PORTABLE CHEST 1 VIEW COMPARISON:  Prior  chest x-ray 03/25/2018 FINDINGS: The lungs are clear and negative for focal airspace consolidation, pulmonary edema or suspicious pulmonary nodule. No pleural effusion or pneumothorax. Cardiac and mediastinal contours are within normal limits. No acute fracture or lytic or blastic osseous lesions. Calcifications are present in the transverse aorta. The visualized upper abdominal bowel gas pattern is unremarkable. IMPRESSION: No active disease. Electronically Signed   By: Malachy Moan M.D.   On: 04/08/2018 18:00    EKG     EKG: EKG was personally reviewed and demonstrates: Atrial fibrillation with ventricular rate of 110 bpm.  Telemetry: Telemetry was personally reviewed and demonstrates: Currently in normal sinus rhythm. Patient was in atrial fibrillation earlier this morning and then had a 7 second pause and converted back to sinus rhythm.   Cardiac Imaging    None.  Assessment & Plan    Atypical Chest Pain - Patient presents with constant "sharp" chest pain for 4-5 days. Patient has advanced dementia and lives in a skilled nursing facility.  - Initial EKG showed atrial fibrillation with ventricular rate of 110 bpm. - Troponin negative x3. - Atypical  presentation and with negative enzymes and advanced demential, do not anticipate any additional workup.   Paroxysmal Atrial Fibrillation / Sinus Pause / Ventricular Tachycardia - Initially presented in atrial fibrillation with RVR. - Currently in sinus rhythm on telemetry.  - Per telemetry review, patient was in atrial fibrillation earlier this morning and then  had a 7 second pause and then went back into normal sinus rhythm. While I was in the room, patient had several seconds of ventricular tachycardia with heart rates in the 180's. Patient completely asymptomatic at the time - she was alert and talking with me. - Potassium 3.7. Goal > 4.0. Replete as needed. - Will check magnesium. Goal > 2.0. - TSH normal on 03/26/2018. - Patient is not on any AV nodal agents at this time given significant sinus pauses. - CHADSVASC score at least 2 (HTN, age). However, given patient's advanced dementia, will defer chronic anticoagulation to MD. - Per chart review, patient had multiple sinus pauses with longest be >10 seconds during hospitalization earlier this month. Pacemaker was discussed at that time but patient decline. We were unable to contact family at that time. Today, patient states she would like a pacemaker. However, patient is DNR and do not know if patient is able to consent due to dementia. May need to try to contact family again.  Otherwise, per primary team.  Signed, Corrin Parker, PA-C 04/09/2018, 9:38 AM   Pt seen and examined   I agree with findings as ntoed by Thane Edu above Pt is a 73 yo with history of HTN, PAF, sinus pauses Admitted yesteday with CP     Pain is intermitt, sharp, pleuritic      On exam, pt is a thin 73 yo in NAD Neck:   JVP is normal   Cardiac RRR   No signif murmurs  Chest Mild tenderness,  Lungs are CTA   Abd with no signif tenderness   Ext are without edeam   Good pulses  1  CP   Atypical   Does not appear to be cardiac in origin  ? Musculoskeletal  Rx for  pain  2  PAF   HR is up some at times    Cannot place on neg chronotrope with hx of long pauses   Pt declined pacer in past     With dementia and falls I would  not anticoagulate  Will sign off   Call with questions .    Dietrich Pates  For questions or updates, please contact   Please consult [email or web address] for contact info under Cardiology/STEMI.

## 2018-04-09 NOTE — Social Work (Signed)
Noted patient is long term care resident at [facility]. Met with patient at bedside. Patient is agreeable to return to [place]. Noted plan for pace maker. Full assessment to follow and CSW will follow for support with discharge planning.  Estanislado Emms, LCSW [phone]

## 2018-04-10 ENCOUNTER — Encounter (HOSPITAL_COMMUNITY)
Admission: EM | Disposition: A | Discharge status: Skilled Nursing Facility | Payer: Self-pay | Source: Skilled Nursing Facility | Attending: Student

## 2018-04-10 ENCOUNTER — Encounter (HOSPITAL_COMMUNITY): Payer: Self-pay | Admitting: Cardiology

## 2018-04-10 DIAGNOSIS — I495 Sick sinus syndrome: Secondary | ICD-10-CM | POA: Diagnosis present

## 2018-04-10 DIAGNOSIS — K219 Gastro-esophageal reflux disease without esophagitis: Secondary | ICD-10-CM | POA: Diagnosis present

## 2018-04-10 DIAGNOSIS — F0280 Dementia in other diseases classified elsewhere without behavioral disturbance: Secondary | ICD-10-CM | POA: Diagnosis present

## 2018-04-10 DIAGNOSIS — G309 Alzheimer's disease, unspecified: Secondary | ICD-10-CM | POA: Diagnosis present

## 2018-04-10 DIAGNOSIS — N179 Acute kidney failure, unspecified: Secondary | ICD-10-CM | POA: Diagnosis present

## 2018-04-10 DIAGNOSIS — I4819 Other persistent atrial fibrillation: Secondary | ICD-10-CM | POA: Diagnosis present

## 2018-04-10 DIAGNOSIS — Z7982 Long term (current) use of aspirin: Secondary | ICD-10-CM | POA: Diagnosis not present

## 2018-04-10 DIAGNOSIS — G300 Alzheimer's disease with early onset: Secondary | ICD-10-CM | POA: Diagnosis not present

## 2018-04-10 DIAGNOSIS — E039 Hypothyroidism, unspecified: Secondary | ICD-10-CM | POA: Diagnosis present

## 2018-04-10 DIAGNOSIS — Z833 Family history of diabetes mellitus: Secondary | ICD-10-CM | POA: Diagnosis not present

## 2018-04-10 DIAGNOSIS — F319 Bipolar disorder, unspecified: Secondary | ICD-10-CM | POA: Diagnosis present

## 2018-04-10 DIAGNOSIS — R296 Repeated falls: Secondary | ICD-10-CM | POA: Diagnosis present

## 2018-04-10 DIAGNOSIS — I10 Essential (primary) hypertension: Secondary | ICD-10-CM | POA: Diagnosis present

## 2018-04-10 DIAGNOSIS — Z950 Presence of cardiac pacemaker: Secondary | ICD-10-CM | POA: Diagnosis not present

## 2018-04-10 DIAGNOSIS — Z79890 Hormone replacement therapy (postmenopausal): Secondary | ICD-10-CM | POA: Diagnosis not present

## 2018-04-10 DIAGNOSIS — G20 Parkinson's disease: Secondary | ICD-10-CM | POA: Diagnosis present

## 2018-04-10 DIAGNOSIS — E785 Hyperlipidemia, unspecified: Secondary | ICD-10-CM | POA: Diagnosis present

## 2018-04-10 DIAGNOSIS — Z66 Do not resuscitate: Secondary | ICD-10-CM | POA: Diagnosis present

## 2018-04-10 DIAGNOSIS — R0789 Other chest pain: Secondary | ICD-10-CM | POA: Diagnosis present

## 2018-04-10 DIAGNOSIS — R079 Chest pain, unspecified: Secondary | ICD-10-CM | POA: Diagnosis present

## 2018-04-10 DIAGNOSIS — Z8249 Family history of ischemic heart disease and other diseases of the circulatory system: Secondary | ICD-10-CM | POA: Diagnosis not present

## 2018-04-10 DIAGNOSIS — G2581 Restless legs syndrome: Secondary | ICD-10-CM | POA: Diagnosis present

## 2018-04-10 HISTORY — PX: PACEMAKER IMPLANT: EP1218

## 2018-04-10 LAB — CBC
HEMATOCRIT: 40.3 % (ref 36.0–46.0)
Hemoglobin: 12.9 g/dL (ref 12.0–15.0)
MCH: 28.2 pg (ref 26.0–34.0)
MCHC: 32 g/dL (ref 30.0–36.0)
MCV: 88 fL (ref 80.0–100.0)
Platelets: 201 10*3/uL (ref 150–400)
RBC: 4.58 MIL/uL (ref 3.87–5.11)
RDW: 14.6 % (ref 11.5–15.5)
WBC: 7.2 10*3/uL (ref 4.0–10.5)
nRBC: 0 % (ref 0.0–0.2)

## 2018-04-10 LAB — BASIC METABOLIC PANEL
Anion gap: 8 (ref 5–15)
BUN: 15 mg/dL (ref 8–23)
CO2: 23 mmol/L (ref 22–32)
Calcium: 9.2 mg/dL (ref 8.9–10.3)
Chloride: 109 mmol/L (ref 98–111)
Creatinine, Ser: 0.99 mg/dL (ref 0.44–1.00)
GFR calc Af Amer: >60 mL/min (ref >60)
GFR calc non Af Amer: 57 mL/min — ABNORMAL LOW (ref >60)
Glucose, Bld: 102 mg/dL — ABNORMAL HIGH (ref 70–99)
Potassium: 3.9 mmol/L (ref 3.5–5.1)
Sodium: 140 mmol/L (ref 135–145)

## 2018-04-10 LAB — SURGICAL PCR SCREEN
MRSA, PCR: POSITIVE — AB
Staphylococcus aureus: POSITIVE — AB

## 2018-04-10 SURGERY — PACEMAKER IMPLANT
Anesthesia: LOCAL

## 2018-04-10 MED ORDER — HEPARIN (PORCINE) IN NACL 1000-0.9 UT/500ML-% IV SOLN
INTRAVENOUS | Status: AC
  Filled 2018-04-10: qty 500

## 2018-04-10 MED ORDER — SODIUM CHLORIDE 0.9 % IV SOLN
INTRAVENOUS | Status: AC
  Filled 2018-04-10: qty 2

## 2018-04-10 MED ORDER — CEFAZOLIN SODIUM-DEXTROSE 2-4 GM/100ML-% IV SOLN
INTRAVENOUS | Status: AC
  Filled 2018-04-10: qty 100

## 2018-04-10 MED ORDER — LIDOCAINE HCL (PF) 1 % IJ SOLN
INTRAMUSCULAR | Status: AC
  Filled 2018-04-10: qty 60

## 2018-04-10 MED ORDER — IOPAMIDOL (ISOVUE-370) INJECTION 76%
INTRAVENOUS | Status: DC | PRN
  Administered 2018-04-10: 15 mL via INTRAVENOUS

## 2018-04-10 MED ORDER — CHLORHEXIDINE GLUCONATE CLOTH 2 % EX PADS
6.0000 | MEDICATED_PAD | Freq: Every day | CUTANEOUS | Status: DC
  Administered 2018-04-10: 6 via TOPICAL

## 2018-04-10 MED ORDER — IOPAMIDOL (ISOVUE-370) INJECTION 76%
INTRAVENOUS | Status: AC
  Filled 2018-04-10: qty 50

## 2018-04-10 MED ORDER — CEFAZOLIN SODIUM-DEXTROSE 1-4 GM/50ML-% IV SOLN
1.0000 g | Freq: Four times a day (QID) | INTRAVENOUS | Status: AC
  Administered 2018-04-10 – 2018-04-11 (×3): 1 g via INTRAVENOUS
  Filled 2018-04-10 (×3): qty 50

## 2018-04-10 MED ORDER — MUPIROCIN 2 % EX OINT
1.0000 "application " | TOPICAL_OINTMENT | Freq: Two times a day (BID) | CUTANEOUS | Status: DC
  Administered 2018-04-10 – 2018-04-11 (×2): 1 via NASAL
  Filled 2018-04-10 (×2): qty 22

## 2018-04-10 MED ORDER — HEPARIN (PORCINE) IN NACL 1000-0.9 UT/500ML-% IV SOLN
INTRAVENOUS | Status: DC | PRN
  Administered 2018-04-10: 500 mL

## 2018-04-10 MED ORDER — LIDOCAINE HCL (PF) 1 % IJ SOLN
INTRAMUSCULAR | Status: DC | PRN
  Administered 2018-04-10: 60 mL

## 2018-04-10 SURGICAL SUPPLY — 7 items
CABLE SURGICAL S-101-97-12 (CABLE) ×2 IMPLANT
LEAD TENDRIL MRI 46CM LPA1200M (Lead) ×2 IMPLANT
LEAD TENDRIL MRI 52CM LPA1200M (Lead) ×2 IMPLANT
PACEMAKER ASSURITY DR-RF (Pacemaker) ×2 IMPLANT
PAD PRO RADIOLUCENT 2001M-C (PAD) ×2 IMPLANT
SHEATH CLASSIC 8F (SHEATH) ×4 IMPLANT
TRAY PACEMAKER INSERTION (PACKS) ×2 IMPLANT

## 2018-04-10 NOTE — Progress Notes (Signed)
Orthopedic Tech Progress Note Patient Details:  Jessica Hayes Dec 04, 1945 [id number] Called up there RN said patient has on sling Patient ID: [name], female   DOB: 1945/10/15, 73 y.o.   MRN: [id number]   [name] 04/10/2018, 11:27 AM

## 2018-04-10 NOTE — Progress Notes (Signed)
[facility] PROGRESS NOTE  Jessica Hayes IFO:[id number] DOB: 1945-12-29 DOA: 04/08/2018 PCP: Wilmer Floor., MD  Assessment/Plan:  1. Chest pain. Serial troponin and EKG negative for acute ischemic finding.  CTA chest without PE or pneumonia.  She had atrial fibrillation with RVR to 110 on admission.  She converted to sinus rhythm spontaneously.  She also had a 7-second pause on telemetry and converted back to sinus rhythm.  As a result of this, cardiology did not feel it is safe to start her on nodal blocking agents.  Cardiology also recommended against anticoagulation.  Cardiology did not feel her chest pain is of cardiac etiology. Pacemaker discussed for tachy brady syndrome which she initially declined. On 04/09/18 patient indicated she did want pacer. She and cards and family discussed. Procedure today.   Paroxysmal atrial fibrillation: RVR resolved.  She spontaneously converted to sinus rhythm.  No rate control due to sinus pause on telemetry.  Patient scheduled for pacemaker today per cardiology.  Cardiology recommended against anticoagulation.  GERD: Discontinued omeprazole 20 mg daily.  Started Protonix 40 mg daily.  These could be contributing to chest pain.  Other medical conditions including Alzheimer's dementia, Parkinson disease, restless leg syndrome and hypertension stable.    Mild AKI: Resolved.   Code Status: dnr Family Communication: none present Disposition Plan: back to facility likely tomorrow   Consultants:  [facility] electrophysiology  [facility] Pates cardiology  Procedures:  Pacer implantation  Antibiotics:  none  HPI/Subjective: Jessica Hayes a 73 y.o.femalewith medical history significant ofAlzheimer's disease, parkinson's dz, HTN, BPD. Patient resides at SNF at baseline due to dementia. Patient admitted earlier this month for encephalopathy believed to be due to UTI. MRI was negative for stroke findings at that time. Found to be in  A.Fib. Initially started on beta blocker for rate control however had significant sinus pauses with this so beta blocker stopped.  No complaints this am.   Objective: Vitals:   04/10/18 1121 04/10/18 1136  BP: 110/84 122/69  Pulse: 82 80  Resp: 18 13  Temp:    SpO2: 100% 99%    Intake/Output Summary (Last 24 hours) at 04/10/2018 1147 Last data filed at 04/10/2018 0553 Gross per 24 hour  Intake 243 ml  Output 300 ml  Net -57 ml   Filed Weights   04/09/18 0115 04/10/18 0546  Weight: 78.3 kg 77.7 kg    Exam:   General:  Awake alert oriented to self and place  Cardiovascular: irregularly irregualar  Respiratory: normal effort BS clear bilaterally no wheeze  Abdomen: soft +BS no guarding or rebounding  Musculoskeletal: joints without swelling/erythema   Data Reviewed: Basic Metabolic Panel: Recent Labs  Lab 04/08/18 1754 04/09/18 0815 04/10/18 0436  NA 139 139 140  K 4.0 3.7 3.9  CL 106 106 109  CO2 23 21* 23  GLUCOSE 123* 101* 102*  BUN 14 13 15   CREATININE 1.09* 0.96 0.99  CALCIUM 9.1 9.1 9.2  MG  --  2.1  --    Liver Function Tests: Recent Labs  Lab 04/08/18 1754  AST 33  ALT 12  ALKPHOS 102  BILITOT 1.0  PROT 7.2  ALBUMIN 3.1*   Recent Labs  Lab 04/08/18 1754  LIPASE 27   No results for input(s): AMMONIA in the last 168 hours. CBC: Recent Labs  Lab 04/08/18 1754 04/10/18 0436  WBC 8.7 7.2  NEUTROABS 5.1  --   HGB 13.9 12.9  HCT 44.8 40.3  MCV 88.2 88.0  PLT  210 201   Cardiac Enzymes: Recent Labs  Lab 04/09/18 0019 04/09/18 0327 04/09/18 0633  TROPONINI <0.03 <0.03 <0.03   BNP (last 3 results) No results for input(s): BNP in the last 8760 hours.  ProBNP (last 3 results) No results for input(s): PROBNP in the last 8760 hours.  CBG: No results for input(s): GLUCAP in the last 168 hours.  Recent Results (from the past 240 hour(s))  Surgical PCR screen     Status: Abnormal   Collection Time: 04/09/18  4:13 PM  Result  Value Ref Range Status   MRSA, PCR POSITIVE (A) NEGATIVE Final    Comment: RESULT CALLED TO, READ BACK BY AND VERIFIED WITH: RN A MCPHEARSON [id number]    Staphylococcus aureus POSITIVE (A) NEGATIVE Final    Comment: (NOTE) The Xpert SA Assay (FDA approved for NASAL specimens in patients 15 years of age and older), is one component of a comprehensive surveillance program. It is not intended to diagnose infection nor to guide or monitor treatment. Performed at [facility], [address] [place], [place] [address]      Studies: Ct Angio Chest Pe W And/or Wo Contrast  Result Date: 04/08/2018 CLINICAL DATA:  Central chest pain x4 days with history of atrial fibrillation. EXAM: CT ANGIOGRAPHY CHEST WITH CONTRAST TECHNIQUE: Multidetector CT imaging of the chest was performed using the standard protocol during bolus administration of intravenous contrast. Multiplanar CT image reconstructions and MIPs were obtained to evaluate the vascular anatomy. CONTRAST:  42mL ISOVUE-370 IOPAMIDOL (ISOVUE-370) INJECTION 76% COMPARISON:  04/25/2017 FINDINGS: Cardiovascular: Conventional branch pattern of the great vessels with atherosclerotic aorta. No thoracic aortic aneurysm or dissection. Satisfactory opacification of the pulmonary arteries to the segmental level without pulmonary embolus. Heart size is stable and within normal limits with coronary arteriosclerosis. Mediastinum/Nodes: No thyromegaly or mass. Patent trachea and mainstem bronchi. The CT appearance of the esophagus is unremarkable. No pathologically enlarged lymph nodes. Lungs/Pleura: Low lung volumes with bibasilar atelectasis and trace pleural effusions. Faint ground-glass opacities noted throughout both lungs likely reflect areas of hypoventilatory change. Right-sided pulmonary nodules measuring 4 mm or less are again noted, some of which appear calcified, series 7, image 68, 63, 46 and 24. Upper Abdomen: Status post cholecystectomy.  No acute abnormality within the upper abdomen. Musculoskeletal: No chest wall abnormality. No acute or significant osseous findings. Review of the MIP images confirms the above findings. IMPRESSION: 1. No acute pulmonary embolus, aortic aneurysm or dissection. 2. Stable right-sided 4 mm or less pulmonary nodules. Aortic Atherosclerosis (ICD10-I70.0). Electronically Signed   By: Tollie Eth M.D.   On: 04/08/2018 22:51   Dg Chest Port 1 View  Result Date: 04/08/2018 CLINICAL DATA:  73 year old female with chest pains for the past 4 days, increased shortness of breath EXAM: PORTABLE CHEST 1 VIEW COMPARISON:  Prior chest x-ray 03/25/2018 FINDINGS: The lungs are clear and negative for focal airspace consolidation, pulmonary edema or suspicious pulmonary nodule. No pleural effusion or pneumothorax. Cardiac and mediastinal contours are within normal limits. No acute fracture or lytic or blastic osseous lesions. Calcifications are present in the transverse aorta. The visualized upper abdominal bowel gas pattern is unremarkable. IMPRESSION: No active disease. Electronically Signed   By: Malachy Moan M.D.   On: 04/08/2018 18:00    Scheduled Meds: . aspirin EC  81 mg Oral Daily  . carbidopa-levodopa  1 tablet Oral TID WC  . Chlorhexidine Gluconate Cloth  6 each Topical Q0600  . cyclobenzaprine  5  mg Oral QHS  . fenofibrate  54 mg Oral Daily  . levothyroxine  50 mcg Oral QAC breakfast  . mupirocin ointment  1 application Nasal BID  . OLANZapine  15 mg Oral QHS  . oxybutynin  10 mg Oral QHS  . pantoprazole  40 mg Oral Daily  . rOPINIRole  0.5 mg Oral QHS  . temazepam  7.5 mg Oral QHS   Continuous Infusions: .  ceFAZolin (ANCEF) IV      Principal Problem:   Chest pain, rule out acute myocardial infarction Active Problems:   Alzheimer's dementia (HCC)   Essential hypertension   Persistent atrial fibrillation    Time spent: 45 minutes    [facility] Venice LLCBLACK,Geraldo Haris M NP [facility]  If 7PM-7AM,  please contact night-coverage at [email or web address], password [facility] 04/10/2018, 11:47 AM  LOS: 0 days

## 2018-04-10 NOTE — Care Management Important Message (Addendum)
Important Message  Patient Details  Name: Jessica Hayes MRN: [id number] Date of Birth: May 02, 1945   Medicare Important Message Given:  Yes Reviewed with pt and she is unable to sign. She is left handed and left arm in sling. Copy left in room and placed on chart.   Elliot Cousin, RN 04/10/2018, 12:12 PM

## 2018-04-10 NOTE — NC FL2 (Signed)
[place] MEDICAID FL2 LEVEL OF CARE SCREENING TOOL     IDENTIFICATION  Patient Name: Jessica HumphreyCarolyn A Martel Birthdate: 06-May-1945 Sex: female Admission Date (Current Location): 04/08/2018  [facility] [place]   Facility and Address:  [facility], [address], [place], [place] [address]      Provider Number: [id number]  Attending Physician Name and Address:  Clydie BraunSmith, Rondell A, MD  Relative Name and Phone Number:  Woody SellerDana Beckham, daughter, [phone]    Current Level of Care: Hospital Recommended Level of Care: Skilled Nursing Facility Prior Approval Number:    Date Approved/Denied:   PASRR Number: [id number]  Discharge Plan: SNF    Current Diagnoses: Patient Active Problem List   Diagnosis Date Noted  . Chest pain, rule out acute myocardial infarction 04/09/2018  . Goals of care, counseling/discussion   . Palliative care by specialist   . Persistent atrial fibrillation 03/28/2018  . Sinus pause 03/28/2018  . Acute lower UTI 03/28/2018  . Dehydration 03/26/2018  . Alzheimer's dementia (HCC) 03/26/2018  . Essential hypertension 03/26/2018  . Hyperlipidemia 03/26/2018  . Hypothyroidism 03/26/2018  . AMS (altered mental status) 03/25/2018    Orientation RESPIRATION BLADDER Height & Weight     Self, Situation, Place  Normal Incontinent, External catheter Weight: 77.7 kg Height:     BEHAVIORAL SYMPTOMS/MOOD NEUROLOGICAL BOWEL NUTRITION STATUS      Continent Diet(please see DC summary)  AMBULATORY STATUS COMMUNICATION OF NEEDS Skin   Extensive Assist Verbally Normal                       Personal Care Assistance Level of Assistance  Bathing, Feeding, Dressing Bathing Assistance: (baseline) Feeding assistance: (baseline) Dressing Assistance: (baseline)     Functional Limitations Info  Sight, Hearing, Speech Sight Info: Adequate Hearing Info: Adequate Speech Info: Adequate    SPECIAL CARE FACTORS FREQUENCY  PT  (By licensed PT), OT (By licensed OT)     PT Frequency: 5x/weekly OT Frequency: 5x/weekly            Contractures Contractures Info: Not present    Additional Factors Info  Code Status, Allergies Code Status Info: DNR Allergies Info: Demerol (Meperidine Hcl)           Current Medications (04/10/2018):  This is the current hospital active medication list Current Facility-Administered Medications  Medication Dose Route Frequency Provider Last Rate Last Dose  . 0.9 %  sodium chloride infusion   Intravenous Continuous Sheilah PigeonUrsuy, Renee Lynn, PA-C 50 mL/hr at 04/10/18 0548    . 0.9 %  sodium chloride infusion  250 mL Intravenous Continuous Sheilah PigeonUrsuy, Renee Lynn, PA-C      . [MAR Hold] acetaminophen (TYLENOL) tablet 650 mg  650 mg Oral Q4H PRN Hillary BowGardner, Jared M, DO   650 mg at 04/09/18 1139  . [MAR Hold] aspirin EC tablet 81 mg  81 mg Oral Daily Hillary BowGardner, Jared M, DO   81 mg at 04/09/18 1040  . [MAR Hold] carbidopa-levodopa (SINEMET IR) 25-100 MG per tablet immediate release 1 tablet  1 tablet Oral TID WC Hillary BowGardner, Jared M, DO   1 tablet at 04/10/18 [phone]  . chlorhexidine (HIBICLENS) 4 % liquid 4 application  60 mL Topical Once Sheilah PigeonUrsuy, Renee Lynn, PA-C      . [MAR Hold] Chlorhexidine Gluconate Cloth 2 % PADS 6 each  6 each Topical Q0600 Clydie BraunSmith, Rondell A, MD   6 each at 04/10/18 0818  . [MAR Hold] cyclobenzaprine (  FLEXERIL) tablet 5 mg  5 mg Oral QHS Lyda Perone M, DO   5 mg at 04/09/18 2136  . [MAR Hold] fenofibrate tablet 54 mg  54 mg Oral Daily Lyda Perone M, DO   54 mg at 04/09/18 1040  . [MAR Hold] levothyroxine (SYNTHROID, LEVOTHROID) tablet 50 mcg  50 mcg Oral QAC breakfast Hillary Bow, DO   50 mcg at 04/10/18 3875  . [MAR Hold] mupirocin ointment (BACTROBAN) 2 % 1 application  1 application Nasal BID Clydie Braun, MD      . Mitzi Hansen Hold] OLANZapine (ZYPREXA) tablet 15 mg  15 mg Oral QHS Lyda Perone M, DO   15 mg at 04/09/18 2137  . [MAR Hold] ondansetron (ZOFRAN) injection 4 mg   4 mg Intravenous Q6H PRN Hillary Bow, DO      . [MAR Hold] oxybutynin (DITROPAN-XL) 24 hr tablet 10 mg  10 mg Oral QHS Lyda Perone M, DO   10 mg at 04/09/18 2137  . [MAR Hold] pantoprazole (PROTONIX) EC tablet 40 mg  40 mg Oral Daily Lyda Perone M, DO   40 mg at 04/09/18 1040  . [MAR Hold] rOPINIRole (REQUIP) tablet 0.5 mg  0.5 mg Oral QHS Lyda Perone M, DO   0.5 mg at 04/09/18 2137  . sodium chloride flush (NS) 0.9 % injection 3 mL  3 mL Intravenous Q12H Sheilah Pigeon, PA-C   3 mL at 04/09/18 2138  . sodium chloride flush (NS) 0.9 % injection 3 mL  3 mL Intravenous PRN Sheilah Pigeon, PA-C      . [MAR Hold] temazepam (RESTORIL) capsule 7.5 mg  7.5 mg Oral QHS Lyda Perone M, DO   7.5 mg at 04/09/18 2136     Discharge Medications: Please see discharge summary for a list of discharge medications.  Relevant Imaging Results:  Relevant Lab Results:   Additional Information SSN: [id number]  Abigail Butts, LCSW

## 2018-04-10 NOTE — Clinical Social Work Note (Signed)
Clinical Social Work Assessment  Patient Details  Name: Jessica Hayes MRN: [id number] Date of Birth: [birth year]  Date of referral:  04/10/18               Reason for consult:  Facility Placement, Discharge Planning                Permission sought to share information with:  Facility Sport and exercise psychologist, Family Supports Permission granted to share information::  Yes, Verbal Permission Granted  Name::     Berneda Rose  Agency::  [facility]  Relationship::  daughter  Contact Information:  [phone]  Housing/Transportation Living arrangements for the past 2 months:  [place] of Information:  Patient, Facility Patient Interpreter Needed:  None Criminal Activity/Legal Involvement Pertinent to Current Situation/Hospitalization:  No - Comment as needed Significant Relationships:  Adult Children Lives with:  Facility Resident Do you feel safe going back to the place where you live?  Yes Need for family participation in patient care:  Yes (Comment)  Care giving concerns: Patient is a long term care resident at [facility]. Plan for pace maker placement.   Social Worker assessment / plan: CSW met with patient at bedside. Patient alert and oriented. CSW introduced self and role and discussed disposition planning.  Patient reported she stays at [facility] and plans to return at discharge. She has a daughter in the area. Patient aware and agreeable to pace maker placement. CSW will follow for medical readiness after pace maker, and support with discharge back to SNF.  Employment status:  Retired Forensic scientist:  Medicare PT Recommendations:  Not assessed at this time [place] / Referral to community resources:  [place]  Patient/Family's Response to care: Patient appreciative of care.  Patient/Family's Understanding of and Emotional Response to Diagnosis, Current Treatment, and Prognosis: Patient with understanding of condition and  care needs. Patient agreeable to return to her SNF.  Emotional Assessment Appearance:  Appears stated age Attitude/Demeanor/Rapport:  Engaged Affect (typically observed):  Accepting, Calm, Pleasant, Appropriate Orientation:  Oriented to Self, Oriented to Place, Oriented to Situation Alcohol / Substance use:  Not Applicable Psych involvement (Current and /or in the community):  No (Comment)  Discharge Needs  Concerns to be addressed:  Discharge Planning Concerns, Care Coordination Readmission within the last 30 days:  Yes Current discharge risk:  Physical Impairment, Dependent with Mobility Barriers to Discharge:  Continued Medical Work up   Estanislado Emms, LCSW 04/10/2018, 10:51 AM

## 2018-04-10 NOTE — Progress Notes (Signed)
Jessica Hayes has presented today for surgery, with the diagnosis of tachy/brady syndrome.  The various methods of treatment have been discussed with the patient and family. After consideration of risks, benefits and other options for treatment, the patient has consented to  Procedure(s): Pacemaker implant as a surgical intervention .  Risks include but not limited to bleeding, tamponade, infection, pneumothorax, among others. The patient's history has been reviewed, patient examined, no change in status, stable for surgery.  I have reviewed the patient's chart and labs.  Questions were answered to the patient's satisfaction.    Kamryn Messineo Elberta Fortis, MD 04/10/2018 7:49 AM

## 2018-04-11 ENCOUNTER — Inpatient Hospital Stay (HOSPITAL_COMMUNITY): Payer: Medicare HMO

## 2018-04-11 DIAGNOSIS — Z950 Presence of cardiac pacemaker: Secondary | ICD-10-CM

## 2018-04-11 MED ORDER — CARVEDILOL 6.25 MG PO TABS
6.2500 mg | ORAL_TABLET | Freq: Two times a day (BID) | ORAL | Status: DC
  Administered 2018-04-11: 6.25 mg via ORAL
  Filled 2018-04-11: qty 1

## 2018-04-11 MED ORDER — CARVEDILOL 6.25 MG PO TABS: 6.2500 mg | ORAL_TABLET | Freq: Two times a day (BID) | ORAL | Status: AC

## 2018-04-11 MED ORDER — TEMAZEPAM 7.5 MG PO CAPS: 7.5000 mg | ORAL_CAPSULE | Freq: Every day | ORAL | 0 refills | Status: AC

## 2018-04-11 NOTE — Progress Notes (Addendum)
Progress Note  Patient Name: Jessica Hayes Date of Encounter: 04/11/2018  Primary Cardiologist: Dr. Rennis Golden  Subjective   Minimal discomfort at pacer site, no CP, no SOB  Inpatient Medications    Scheduled Meds: . aspirin EC  81 mg Oral Daily  . carbidopa-levodopa  1 tablet Oral TID WC  . carvedilol  6.25 mg Oral BID WC  . Chlorhexidine Gluconate Cloth  6 each Topical Q0600  . cyclobenzaprine  5 mg Oral QHS  . fenofibrate  54 mg Oral Daily  . levothyroxine  50 mcg Oral QAC breakfast  . mupirocin ointment  1 application Nasal BID  . OLANZapine  15 mg Oral QHS  . oxybutynin  10 mg Oral QHS  . pantoprazole  40 mg Oral Daily  . rOPINIRole  0.5 mg Oral QHS  . temazepam  7.5 mg Oral QHS   Continuous Infusions:  PRN Meds: acetaminophen, ondansetron (ZOFRAN) IV   Vital Signs    Vitals:   04/10/18 1527 04/10/18 1856 04/10/18 2007 04/11/18 0500  BP: 130/88 (!) 125/59 117/68 (!) 123/95  Pulse: 83 89 84 82  Resp: 16 17 15 16   Temp: (!) 97 F (36.1 C) 97.9 F (36.6 C) 97.8 F (36.6 C) 97.6 F (36.4 C)  TempSrc: Oral Oral Oral Axillary  SpO2: 97% 96% 96% 97%  Weight:    77.3 kg    Intake/Output Summary (Last 24 hours) at 04/11/2018 0909 Last data filed at 04/11/2018 0349 Gross per 24 hour  Intake 563.79 ml  Output 600 ml  Net -36.21 ml   Last 3 Weights 04/11/2018 04/10/2018 04/09/2018  Weight (lbs) 170 lb 6.7 oz 171 lb 4.8 oz 172 lb 9.9 oz  Weight (kg) 77.3 kg 77.7 kg 78.3 kg      Telemetry    currently SR, has had breif periods of PAF, fair rate control, low 100's - Personally Reviewed  ECG    AFib 113bpm - Personally Reviewed  Physical Exam   GEN: No acute distress.   Neck: No JVD Cardiac: RRR, no murmurs, rubs, or gallops.  Respiratory: CTA b/l. GI: Soft, nontender, non-distended  MS: No edema; No deformity. Neuro:  Nonfocal  Psych: Normal affect   PPM implant site is dry, no bleeding, no hematoma  Labs    Chemistry Recent Labs  Lab  04/08/18 1754 04/09/18 0815 04/10/18 0436  NA 139 139 140  K 4.0 3.7 3.9  CL 106 106 109  CO2 23 21* 23  GLUCOSE 123* 101* 102*  BUN 14 13 15   CREATININE 1.09* 0.96 0.99  CALCIUM 9.1 9.1 9.2  PROT 7.2  --   --   ALBUMIN 3.1*  --   --   AST 33  --   --   ALT 12  --   --   ALKPHOS 102  --   --   BILITOT 1.0  --   --   GFRNONAA 50* 59* 57*  GFRAA 58* >60 >60  ANIONGAP 10 12 8      Hematology Recent Labs  Lab 04/08/18 1754 04/10/18 0436  WBC 8.7 7.2  RBC 5.08 4.58  HGB 13.9 12.9  HCT 44.8 40.3  MCV 88.2 88.0  MCH 27.4 28.2  MCHC 31.0 32.0  RDW 14.6 14.6  PLT 210 201    Cardiac Enzymes Recent Labs  Lab 04/09/18 0019 04/09/18 0327 04/09/18 0633  TROPONINI <0.03 <0.03 <0.03    Recent Labs  Lab 04/08/18 1820  TROPIPOC 0.00  BNPNo results for input(s): BNP, PROBNP in the last 168 hours.   DDimer No results for input(s): DDIMER in the last 168 hours.   Radiology    No results found.  Cardiac Studies    Patient Profile     73 y.o. female with a hx of Alzheimer's dementia, Parkinson's disease, hypothyroidism, HTN, HLD, resides at SNF, and very recently AFib   Admitted with c/p CP, observed to have prolonged post-termination pauses on telemetry  Assessment & Plan    1. Paroxysmal AFib     CHA2DS2Vasc is 3, not on a/c  She has Parkinson's with reported frequent falls, felt too high risk for a/c   2. Marked post termination pauses     As long as 11 seconds by record from her last hospital stay, and 6.49 followed by 4.07 seconds     Not clear if symptomatic, she reports dizzy spells at home (SNF)      Off coreg since 03/31/2018  S/p PPM implant yesterday with Dr. Elberta Fortis Site is stable I have discussed site care and activity instructions with the patient, written as well in her AVS for family and SNF staff, please make sure AVS accompanies patient back to SNF Device check this AM with intact function Await CXR, read  Once CXR read and if  negative for ptx, can be discharged from our perspective EP follow up is in place Coreg has been resumed   Further care with medicine team   [facility] will sign off.   Medication Recommendations:  Coreg 6.25mg  BID Other recommendations (labs, testing, etc):  cxr this AM is pending, noted as above Follow up as an outpatient:  Is in place  For questions or updates, please contact [facility] Please consult [email or web address] for contact info under        Signed, Sheilah Pigeon, PA-C  04/11/2018, 9:09 AM    I have seen and examined this patient with Francis Dowse.  Agree with above, note added to reflect my findings.  On exam, iRRR, no murmurs, lungs clear.  In AF this AM. No anticoagulation due to frequient falls. S/p pacemaker for tachy/brady. CXR and interrogation without issue. Plan for discharge today with follow up in clinic.  Will M. Camnitz MD 04/11/2018 12:01 PM

## 2018-04-11 NOTE — Social Work (Addendum)
Patient will discharge back to [facility]. Anticipated discharge date: 04/11/18 Family notified: Woody Seller, daughter (left voicemail) Transportation by: Sharin Mons  Nurse to call report to [phone]. Patient will go to room 103A at the facility.  CSW signing off.  Abigail Butts, LCSWA  Clinical Social Worker

## 2018-04-11 NOTE — Discharge Summary (Signed)
Physician Discharge Summary  Jessica Hayes ZOX:[id number] DOB: [birth year] DOA: 04/08/2018  PCP: Wilmer Floor., MD  Admit date: 04/08/2018 Discharge date: 04/11/2018  Admitted From: SNF Disposition:  SNF  Recommendations for Outpatient Follow-up:  1. Follow up with PCP in 1-2 weeks 2. Follow-up with cardiology-EP as scheduled  Home Health: None Equipment/Devices: None  Discharge Condition: Stable CODE STATUS: DNR Diet recommendation: Heart healthy  HPI: Per admitting MD, Jessica Hayes is a 73 y.o. female with medical history significant of Alzheimer's disease, parkinson's dz, HTN, BPD. Patient resides at SNF at baseline due to advanced dementia. Patient admitted earlier this month for encephalopathy believed to be due to UTI.  MRI was negative for stroke findings at that time.  Found to be in A.Fib.  Initially started on beta blocker for rate control however had significant sinus pauses with this so beta blocker stopped. She presents to the ED today with report of 4 day history of CP.  Midsternal discomfort.  Continuously present patient reports.  No associated N/V, SOB.  No radiation.  Hospital Course: Chest pain -patient was admitted to the hospital and cardiology was consulted and followed patient while hospitalized.  She underwent serial troponin and EKG which were negative for acute ischemic findings.  She underwent a CT angiogram which did not show any PE or pneumonic processes.  She was found to have A. fib with RVR with rates up to 110 on admission, and converted to sinus rhythm spontaneously.  This was followed by a prolonged pause on telemetry, and EP was consulted.  Given recurrent significant pauses and concern for tachybradycardia syndrome she underwent a pacemaker placement on 04/10/2018.  Her beta-blocker was initially held but this was resumed.  Discussed with cardiology, patient is stable for discharge, repeat chest x-ray following pacemaker without any evidence of  pneumothorax.  Paroxysmal A. Fib -converted to sinus rhythm with pauses as described above, back into A. fib overnight.  Resumed Coreg per cardiology.  Okay for discharge.  Not a candidate for anticoagulation per cardiology due to recurrent falls  Alzheimer's dementia/Parkinson's disease/restless leg syndrome -resume home medications  GERD-continue home medications  Hyperlipidemia-continue medications  Hypothyroidism-continue home medications   Discharge Diagnoses:  Principal Problem:   Chest pain, rule out acute myocardial infarction Active Problems:   Alzheimer's dementia [facility])   Essential hypertension   Persistent atrial fibrillation   Tachy-brady syndrome [facility])     Discharge Instructions  Discharge Instructions    Call MD for:  difficulty breathing, headache or visual disturbances   Complete by:  As directed    Call MD for:  extreme fatigue   Complete by:  As directed    Call MD for:  persistant dizziness or light-headedness   Complete by:  As directed    Call MD for:  persistant nausea and vomiting   Complete by:  As directed    Call MD for:  severe uncontrolled pain   Complete by:  As directed    Diet - low sodium heart healthy   Complete by:  As directed    Increase activity slowly   Complete by:  As directed      Allergies as of 04/11/2018      Reactions   Demerol [meperidine Hcl] Other (See Comments)   Unknown reaction - per [facility]      Medication List    STOP taking these medications   omeprazole 20 MG capsule Commonly known as:  PRILOSEC Replaced by:  pantoprazole 40 MG  tablet     TAKE these medications   aspirin EC 81 MG tablet Take 81 mg by mouth daily.   carbidopa-levodopa 25-100 MG tablet Commonly known as:  SINEMET IR Take 1 tablet by mouth 3 (three) times daily.   carvedilol 6.25 MG tablet Commonly known as:  COREG Take 1 tablet (6.25 mg total) by mouth 2 (two) times daily with a meal.   cyclobenzaprine 5 MG tablet Commonly known as:   FLEXERIL Take 5 mg by mouth at bedtime.   fenofibrate 48 MG tablet Commonly known as:  TRICOR Take 48 mg by mouth daily.   furosemide 20 MG tablet Commonly known as:  LASIX Take 1 tablet (20 mg total) by mouth as needed for edema. What changed:  when to take this   LACTOBACILLUS PO Take 1 capsule by mouth daily.   levothyroxine 50 MCG tablet Commonly known as:  SYNTHROID, LEVOTHROID Take 50 mcg by mouth daily before breakfast.   OLANZapine 15 MG tablet Commonly known as:  ZYPREXA Take 15 mg by mouth at bedtime.   oxybutynin 10 MG 24 hr tablet Commonly known as:  DITROPAN-XL Take 10 mg by mouth at bedtime.   pantoprazole 40 MG tablet Commonly known as:  PROTONIX Take 1 tablet (40 mg total) by mouth daily. Replaces:  omeprazole 20 MG capsule   rOPINIRole 0.5 MG tablet Commonly known as:  REQUIP Take 0.5 mg by mouth at bedtime.   temazepam 7.5 MG capsule Commonly known as:  RESTORIL Take 7.5 mg by mouth at bedtime.      Follow-up Information    Wilmer Floor., MD Follow up in 1 week(s).   Specialty:  Internal Medicine Contact information: [address] [place] [address] [phone]        Sheilah Pigeon, PA-C Follow up.   Specialty:  Cardiology Why:  04/24/2018 @ 11:00AM, wound check visit Contact information: [address] [place] [address] [phone]        Regan Lemming, MD Follow up.   Specialty:  Cardiology Why:  07/15/2018 @ 9:30AM Contact information: [address] [place] [address] [phone]           Consultations:    Procedures/Studies:  Pacemaker   2D echo  Impressions: - Technically difficult study. LVEF 60-65%, mild LVH, normal wall motion, grade 1 Dd, indeterminate LV filling pressure, normal LA size, trivial TR, RVSP 22 mmHg, normal IVC.    Dg Chest 2 View  Result Date: 04/11/2018 CLINICAL DATA:  Status post cardiac pacemaker insertion. EXAM: CHEST - 2  VIEW COMPARISON:  04/08/2018. FINDINGS: Interval left subclavian bipolar pacemaker with satisfactory position of the lead tips. There is a thin partial linear line near the left lung apex laterally that does not appear to extend beyond that location to indicate that this represents a pleural edge. Otherwise, no pneumothorax is seen. The lungs are clear. Elevation/eventration of the right hemidiaphragm is unchanged. Cholecystectomy clips. Unremarkable bones. IMPRESSION: Interval left subclavian bipolar pacemaker with satisfactory position of the lead tips. No definite pneumothorax. Electronically Signed   By: Beckie Salts M.D.   On: 04/11/2018 09:57   Dg Chest 2 View  Result Date: 03/25/2018 CLINICAL DATA:  Altered mental status EXAM: CHEST - 2 VIEW COMPARISON:  None. FINDINGS: Heart size and vascularity normal. Mild bibasilar atelectasis. Negative for effusion or mass. No acute skeletal abnormality. IMPRESSION: Mild bibasilar atelectasis. Electronically Signed   By: Marlan Palau M.D.  On: 03/25/2018 13:42   Ct Head Wo Contrast  Result Date: 03/25/2018 CLINICAL DATA:  Altered level of consciousness. EXAM: CT HEAD WITHOUT CONTRAST TECHNIQUE: Contiguous axial images were obtained from the base of the skull through the vertex without intravenous contrast. COMPARISON:  None. FINDINGS: Brain: No evidence of acute infarction, hemorrhage, hydrocephalus, extra-axial collection or mass lesion/mass effect. There is mild diffuse low-attenuation within the subcortical and periventricular white matter compatible with chronic microvascular disease. Vascular: No hyperdense vessel or unexpected calcification. Skull: Normal. Negative for fracture or focal lesion. Sinuses/Orbits: No acute finding. Other: None IMPRESSION: 1. No acute intracranial abnormalities. 2. Chronic small vessel ischemic change. Electronically Signed   By: Signa Kellaylor  Stroud M.D.   On: 03/25/2018 14:42   Ct Angio Chest Pe W And/or Wo Contrast  Result  Date: 04/08/2018 CLINICAL DATA:  Central chest pain x4 days with history of atrial fibrillation. EXAM: CT ANGIOGRAPHY CHEST WITH CONTRAST TECHNIQUE: Multidetector CT imaging of the chest was performed using the standard protocol during bolus administration of intravenous contrast. Multiplanar CT image reconstructions and MIPs were obtained to evaluate the vascular anatomy. CONTRAST:  60mL ISOVUE-370 IOPAMIDOL (ISOVUE-370) INJECTION 76% COMPARISON:  04/25/2017 FINDINGS: Cardiovascular: Conventional branch pattern of the great vessels with atherosclerotic aorta. No thoracic aortic aneurysm or dissection. Satisfactory opacification of the pulmonary arteries to the segmental level without pulmonary embolus. Heart size is stable and within normal limits with coronary arteriosclerosis. Mediastinum/Nodes: No thyromegaly or mass. Patent trachea and mainstem bronchi. The CT appearance of the esophagus is unremarkable. No pathologically enlarged lymph nodes. Lungs/Pleura: Low lung volumes with bibasilar atelectasis and trace pleural effusions. Faint ground-glass opacities noted throughout both lungs likely reflect areas of hypoventilatory change. Right-sided pulmonary nodules measuring 4 mm or less are again noted, some of which appear calcified, series 7, image 68, 63, 46 and 24. Upper Abdomen: Status post cholecystectomy. No acute abnormality within the upper abdomen. Musculoskeletal: No chest wall abnormality. No acute or significant osseous findings. Review of the MIP images confirms the above findings. IMPRESSION: 1. No acute pulmonary embolus, aortic aneurysm or dissection. 2. Stable right-sided 4 mm or less pulmonary nodules. Aortic Atherosclerosis (ICD10-I70.0). Electronically Signed   By: Tollie Ethavid  Kwon M.D.   On: 04/08/2018 22:51   Mr Brain Wo Contrast  Result Date: 03/26/2018 CLINICAL DATA:  Focal neuro deficit.  Altered mental status. EXAM: MRI HEAD WITHOUT CONTRAST TECHNIQUE: Multiplanar, multiecho pulse  sequences of the brain and surrounding structures were obtained without intravenous contrast. COMPARISON:  Head CT from yesterday FINDINGS: Brain: No acute infarction, hemorrhage, hydrocephalus, extra-axial collection or mass lesion. History of dementia but no notable atrophy. Minor periventricular chronic small vessel ischemia. Vascular: Major flow voids are preserved Skull and upper cervical spine: Negative for marrow lesion. Sinuses/Orbits: Negative Other: Significant and progressive motion degradation. IMPRESSION: Significantly motion degraded study that is otherwise unremarkable for age. Electronically Signed   By: Marnee SpringJonathon  Watts M.D.   On: 03/26/2018 05:00   Dg Chest Port 1 View  Result Date: 04/08/2018 CLINICAL DATA:  73 year old female with chest pains for the past 4 days, increased shortness of breath EXAM: PORTABLE CHEST 1 VIEW COMPARISON:  Prior chest x-ray 03/25/2018 FINDINGS: The lungs are clear and negative for focal airspace consolidation, pulmonary edema or suspicious pulmonary nodule. No pleural effusion or pneumothorax. Cardiac and mediastinal contours are within normal limits. No acute fracture or lytic or blastic osseous lesions. Calcifications are present in the transverse aorta. The visualized upper abdominal bowel gas pattern is unremarkable. IMPRESSION: No  active disease. Electronically Signed   By: Malachy MoanHeath  McCullough M.D.   On: 04/08/2018 18:00      Subjective: - no chest pain, shortness of breath, no abdominal pain, nausea or vomiting.    Discharge Exam: Vitals:   04/10/18 2007 04/11/18 0500  BP: 117/68 (!) 123/95  Pulse: 84 82  Resp: 15 16  Temp: 97.8 F (36.6 C) 97.6 F (36.4 C)  SpO2: 96% 97%    General: Pt is alert, awake, not in acute distress Cardiovascular: RRR, S1/S2 +, no rubs, no gallops Respiratory: CTA bilaterally, no wheezing, no rhonchi Abdominal: Soft, NT, ND, bowel sounds + Extremities: no edema, no cyanosis    The results of significant  diagnostics from this hospitalization (including imaging, microbiology, ancillary and laboratory) are listed below for reference.     Microbiology: Recent Results (from the past 240 hour(s))  Surgical PCR screen     Status: Abnormal   Collection Time: 04/09/18  4:13 PM  Result Value Ref Range Status   MRSA, PCR POSITIVE (A) NEGATIVE Final    Comment: RESULT CALLED TO, READ BACK BY AND VERIFIED WITH: RN A MCPHEARSON [id number]    Staphylococcus aureus POSITIVE (A) NEGATIVE Final    Comment: (NOTE) The Xpert SA Assay (FDA approved for NASAL specimens in patients 74 years of age and older), is one component of a comprehensive surveillance program. It is not intended to diagnose infection nor to guide or monitor treatment. Performed at [facility], [address] [place], [place] [id number]      Labs: BNP (last 3 results) No results for input(s): BNP in the last 8760 hours. Basic Metabolic Panel: Recent Labs  Lab 04/08/18 1754 04/09/18 0815 04/10/18 0436  NA 139 139 140  K 4.0 3.7 3.9  CL 106 106 109  CO2 23 21* 23  GLUCOSE 123* 101* 102*  BUN 14 13 15   CREATININE 1.09* 0.96 0.99  CALCIUM 9.1 9.1 9.2  MG  --  2.1  --    Liver Function Tests: Recent Labs  Lab 04/08/18 1754  AST 33  ALT 12  ALKPHOS 102  BILITOT 1.0  PROT 7.2  ALBUMIN 3.1*   Recent Labs  Lab 04/08/18 1754  LIPASE 27   No results for input(s): AMMONIA in the last 168 hours. CBC: Recent Labs  Lab 04/08/18 1754 04/10/18 0436  WBC 8.7 7.2  NEUTROABS 5.1  --   HGB 13.9 12.9  HCT 44.8 40.3  MCV 88.2 88.0  PLT 210 201   Cardiac Enzymes: Recent Labs  Lab 04/09/18 0019 04/09/18 0327 04/09/18 0633  TROPONINI <0.03 <0.03 <0.03   BNP: Invalid input(s): POCBNP CBG: No results for input(s): GLUCAP in the last 168 hours. D-Dimer No results for input(s): DDIMER in the last 72 hours. Hgb A1c No results for input(s): HGBA1C in the last 72 hours. Lipid Profile No results for  input(s): CHOL, HDL, LDLCALC, TRIG, CHOLHDL, LDLDIRECT in the last 72 hours. Thyroid function studies No results for input(s): TSH, T4TOTAL, T3FREE, THYROIDAB in the last 72 hours.  Invalid input(s): FREET3 Anemia work up No results for input(s): VITAMINB12, FOLATE, FERRITIN, TIBC, IRON, RETICCTPCT in the last 72 hours. Urinalysis    Component Value Date/Time   COLORURINE YELLOW 04/08/2018 1916   APPEARANCEUR CLEAR 04/08/2018 1916   LABSPEC 1.013 04/08/2018 1916   PHURINE 7.0 04/08/2018 1916   GLUCOSEU NEGATIVE 04/08/2018 1916   HGBUR NEGATIVE 04/08/2018 1916   BILIRUBINUR NEGATIVE 04/08/2018 1916   KETONESUR  NEGATIVE 04/08/2018 1916   PROTEINUR NEGATIVE 04/08/2018 1916   NITRITE NEGATIVE 04/08/2018 1916   LEUKOCYTESUR TRACE (A) 04/08/2018 1916   Sepsis Labs Invalid input(s): PROCALCITONIN,  WBC,  LACTICIDVEN   Time coordinating discharge: 40 minutes  SIGNED:  Pamella Pert, MD  [facility] 04/11/2018, 10:12 AM

## 2018-04-11 NOTE — Clinical Social Work Placement (Signed)
   CLINICAL SOCIAL WORK PLACEMENT  NOTE  Date:  04/11/2018  Patient Details  Name: Jessica Hayes MRN: [id number] Date of Birth: 04-03-1945  Clinical Social Work is seeking post-discharge placement for this patient at the Skilled  Nursing Facility level of care (*CSW will initial, date and re-position this form in  chart as items are completed):  Yes   Patient/family provided with [facility] Clinical Social Work Department's list of facilities offering this level of care within the geographic area requested by the patient (or if unable, by the patient's family).  Yes   Patient/family informed of their freedom to choose among providers that offer the needed level of care, that participate in Medicare, Medicaid or managed care program needed by the patient, have an available bed and are willing to accept the patient.  Yes   Patient/family informed of [facility]'s ownership interest in [facility] and [facility], as well as of the fact that they are under no obligation to receive care at these facilities.  PASRR submitted to EDS on       PASRR number received on       Existing PASRR number confirmed on 04/10/18     FL2 transmitted to all facilities in geographic area requested by pt/family on 04/10/18     FL2 transmitted to all facilities within larger geographic area on       Patient informed that his/her managed care company has contracts with or will negotiate with certain facilities, including the following:  [facility])     Yes   Patient/family informed of bed offers received.  Patient chooses bed at [facility])     Physician recommends and patient chooses bed at      Patient to be transferred to [facility] on 04/11/18.  Patient to be transferred to facility by [facility]     Patient family notified on 04/11/18 of transfer.  Name of family  member notified:        PHYSICIAN Please prepare prescriptions, Please sign DNR     Additional Comment:    _______________________________________________ Abigail Butts, LCSW 04/11/2018, 10:35 AM

## 2018-04-12 DIAGNOSIS — I4819 Other persistent atrial fibrillation: Secondary | ICD-10-CM | POA: Diagnosis not present

## 2018-04-12 DIAGNOSIS — G300 Alzheimer's disease with early onset: Secondary | ICD-10-CM | POA: Diagnosis not present

## 2018-04-12 DIAGNOSIS — F0280 Dementia in other diseases classified elsewhere without behavioral disturbance: Secondary | ICD-10-CM | POA: Diagnosis not present

## 2018-04-12 DIAGNOSIS — M6281 Muscle weakness (generalized): Secondary | ICD-10-CM | POA: Diagnosis not present

## 2018-04-12 DIAGNOSIS — F319 Bipolar disorder, unspecified: Secondary | ICD-10-CM | POA: Diagnosis not present

## 2018-04-12 DIAGNOSIS — R41841 Cognitive communication deficit: Secondary | ICD-10-CM | POA: Diagnosis not present

## 2018-04-12 DIAGNOSIS — N390 Urinary tract infection, site not specified: Secondary | ICD-10-CM | POA: Diagnosis not present

## 2018-04-12 DIAGNOSIS — G20 Parkinson's disease: Secondary | ICD-10-CM | POA: Diagnosis not present

## 2018-04-12 DIAGNOSIS — R2689 Other abnormalities of gait and mobility: Secondary | ICD-10-CM | POA: Diagnosis not present

## 2018-04-13 DIAGNOSIS — G300 Alzheimer's disease with early onset: Secondary | ICD-10-CM | POA: Diagnosis not present

## 2018-04-13 DIAGNOSIS — M6281 Muscle weakness (generalized): Secondary | ICD-10-CM | POA: Diagnosis not present

## 2018-04-13 DIAGNOSIS — G20 Parkinson's disease: Secondary | ICD-10-CM | POA: Diagnosis not present

## 2018-04-13 DIAGNOSIS — N390 Urinary tract infection, site not specified: Secondary | ICD-10-CM | POA: Diagnosis not present

## 2018-04-13 DIAGNOSIS — R41841 Cognitive communication deficit: Secondary | ICD-10-CM | POA: Diagnosis not present

## 2018-04-13 DIAGNOSIS — F0280 Dementia in other diseases classified elsewhere without behavioral disturbance: Secondary | ICD-10-CM | POA: Diagnosis not present

## 2018-04-13 DIAGNOSIS — F319 Bipolar disorder, unspecified: Secondary | ICD-10-CM | POA: Diagnosis not present

## 2018-04-13 DIAGNOSIS — R2689 Other abnormalities of gait and mobility: Secondary | ICD-10-CM | POA: Diagnosis not present

## 2018-04-13 DIAGNOSIS — I4819 Other persistent atrial fibrillation: Secondary | ICD-10-CM | POA: Diagnosis not present

## 2018-04-14 DIAGNOSIS — N390 Urinary tract infection, site not specified: Secondary | ICD-10-CM | POA: Diagnosis not present

## 2018-04-14 DIAGNOSIS — I4819 Other persistent atrial fibrillation: Secondary | ICD-10-CM | POA: Diagnosis not present

## 2018-04-14 DIAGNOSIS — F0280 Dementia in other diseases classified elsewhere without behavioral disturbance: Secondary | ICD-10-CM | POA: Diagnosis not present

## 2018-04-14 DIAGNOSIS — M6281 Muscle weakness (generalized): Secondary | ICD-10-CM | POA: Diagnosis not present

## 2018-04-14 DIAGNOSIS — G20 Parkinson's disease: Secondary | ICD-10-CM | POA: Diagnosis not present

## 2018-04-14 DIAGNOSIS — G300 Alzheimer's disease with early onset: Secondary | ICD-10-CM | POA: Diagnosis not present

## 2018-04-14 DIAGNOSIS — R2689 Other abnormalities of gait and mobility: Secondary | ICD-10-CM | POA: Diagnosis not present

## 2018-04-14 DIAGNOSIS — F319 Bipolar disorder, unspecified: Secondary | ICD-10-CM | POA: Diagnosis not present

## 2018-04-14 DIAGNOSIS — R41841 Cognitive communication deficit: Secondary | ICD-10-CM | POA: Diagnosis not present

## 2018-04-15 DIAGNOSIS — F0280 Dementia in other diseases classified elsewhere without behavioral disturbance: Secondary | ICD-10-CM | POA: Diagnosis not present

## 2018-04-15 DIAGNOSIS — R41841 Cognitive communication deficit: Secondary | ICD-10-CM | POA: Diagnosis not present

## 2018-04-15 DIAGNOSIS — I4819 Other persistent atrial fibrillation: Secondary | ICD-10-CM | POA: Diagnosis not present

## 2018-04-15 DIAGNOSIS — F319 Bipolar disorder, unspecified: Secondary | ICD-10-CM | POA: Diagnosis not present

## 2018-04-15 DIAGNOSIS — M6281 Muscle weakness (generalized): Secondary | ICD-10-CM | POA: Diagnosis not present

## 2018-04-15 DIAGNOSIS — R2689 Other abnormalities of gait and mobility: Secondary | ICD-10-CM | POA: Diagnosis not present

## 2018-04-15 DIAGNOSIS — G20 Parkinson's disease: Secondary | ICD-10-CM | POA: Diagnosis not present

## 2018-04-15 DIAGNOSIS — N390 Urinary tract infection, site not specified: Secondary | ICD-10-CM | POA: Diagnosis not present

## 2018-04-15 DIAGNOSIS — G300 Alzheimer's disease with early onset: Secondary | ICD-10-CM | POA: Diagnosis not present

## 2018-04-16 DIAGNOSIS — R41841 Cognitive communication deficit: Secondary | ICD-10-CM | POA: Diagnosis not present

## 2018-04-16 DIAGNOSIS — F0281 Dementia in other diseases classified elsewhere with behavioral disturbance: Secondary | ICD-10-CM | POA: Diagnosis not present

## 2018-04-16 DIAGNOSIS — G20 Parkinson's disease: Secondary | ICD-10-CM | POA: Diagnosis not present

## 2018-04-16 DIAGNOSIS — N390 Urinary tract infection, site not specified: Secondary | ICD-10-CM | POA: Diagnosis not present

## 2018-04-16 DIAGNOSIS — I4819 Other persistent atrial fibrillation: Secondary | ICD-10-CM | POA: Diagnosis not present

## 2018-04-16 DIAGNOSIS — F419 Anxiety disorder, unspecified: Secondary | ICD-10-CM | POA: Diagnosis not present

## 2018-04-16 DIAGNOSIS — I480 Paroxysmal atrial fibrillation: Secondary | ICD-10-CM | POA: Diagnosis not present

## 2018-04-16 DIAGNOSIS — M6281 Muscle weakness (generalized): Secondary | ICD-10-CM | POA: Diagnosis not present

## 2018-04-16 DIAGNOSIS — I495 Sick sinus syndrome: Secondary | ICD-10-CM | POA: Diagnosis not present

## 2018-04-16 DIAGNOSIS — F329 Major depressive disorder, single episode, unspecified: Secondary | ICD-10-CM | POA: Diagnosis not present

## 2018-04-16 DIAGNOSIS — G300 Alzheimer's disease with early onset: Secondary | ICD-10-CM | POA: Diagnosis not present

## 2018-04-16 DIAGNOSIS — F0280 Dementia in other diseases classified elsewhere without behavioral disturbance: Secondary | ICD-10-CM | POA: Diagnosis not present

## 2018-04-16 DIAGNOSIS — R2689 Other abnormalities of gait and mobility: Secondary | ICD-10-CM | POA: Diagnosis not present

## 2018-04-16 DIAGNOSIS — F319 Bipolar disorder, unspecified: Secondary | ICD-10-CM | POA: Diagnosis not present

## 2018-04-17 DIAGNOSIS — G300 Alzheimer's disease with early onset: Secondary | ICD-10-CM | POA: Diagnosis not present

## 2018-04-17 DIAGNOSIS — G20 Parkinson's disease: Secondary | ICD-10-CM | POA: Diagnosis not present

## 2018-04-17 DIAGNOSIS — M6281 Muscle weakness (generalized): Secondary | ICD-10-CM | POA: Diagnosis not present

## 2018-04-17 DIAGNOSIS — N390 Urinary tract infection, site not specified: Secondary | ICD-10-CM | POA: Diagnosis not present

## 2018-04-17 DIAGNOSIS — R2689 Other abnormalities of gait and mobility: Secondary | ICD-10-CM | POA: Diagnosis not present

## 2018-04-17 DIAGNOSIS — R41841 Cognitive communication deficit: Secondary | ICD-10-CM | POA: Diagnosis not present

## 2018-04-17 DIAGNOSIS — F0280 Dementia in other diseases classified elsewhere without behavioral disturbance: Secondary | ICD-10-CM | POA: Diagnosis not present

## 2018-04-17 DIAGNOSIS — I4819 Other persistent atrial fibrillation: Secondary | ICD-10-CM | POA: Diagnosis not present

## 2018-04-17 DIAGNOSIS — F319 Bipolar disorder, unspecified: Secondary | ICD-10-CM | POA: Diagnosis not present

## 2018-04-18 DIAGNOSIS — F0281 Dementia in other diseases classified elsewhere with behavioral disturbance: Secondary | ICD-10-CM | POA: Diagnosis not present

## 2018-04-18 DIAGNOSIS — M6281 Muscle weakness (generalized): Secondary | ICD-10-CM | POA: Diagnosis not present

## 2018-04-18 DIAGNOSIS — G300 Alzheimer's disease with early onset: Secondary | ICD-10-CM | POA: Diagnosis not present

## 2018-04-18 DIAGNOSIS — F0280 Dementia in other diseases classified elsewhere without behavioral disturbance: Secondary | ICD-10-CM | POA: Diagnosis not present

## 2018-04-18 DIAGNOSIS — I455 Other specified heart block: Secondary | ICD-10-CM | POA: Diagnosis not present

## 2018-04-18 DIAGNOSIS — F319 Bipolar disorder, unspecified: Secondary | ICD-10-CM | POA: Diagnosis not present

## 2018-04-18 DIAGNOSIS — I480 Paroxysmal atrial fibrillation: Secondary | ICD-10-CM | POA: Diagnosis not present

## 2018-04-18 DIAGNOSIS — N390 Urinary tract infection, site not specified: Secondary | ICD-10-CM | POA: Diagnosis not present

## 2018-04-18 DIAGNOSIS — I4819 Other persistent atrial fibrillation: Secondary | ICD-10-CM | POA: Diagnosis not present

## 2018-04-18 DIAGNOSIS — R2689 Other abnormalities of gait and mobility: Secondary | ICD-10-CM | POA: Diagnosis not present

## 2018-04-18 DIAGNOSIS — R41841 Cognitive communication deficit: Secondary | ICD-10-CM | POA: Diagnosis not present

## 2018-04-18 DIAGNOSIS — G20 Parkinson's disease: Secondary | ICD-10-CM | POA: Diagnosis not present

## 2018-04-19 ENCOUNTER — Other Ambulatory Visit: Payer: Self-pay

## 2018-04-19 ENCOUNTER — Emergency Department (HOSPITAL_COMMUNITY)
Admission: EM | Admit: 2018-04-19 | Discharge: 2018-04-20 | Payer: Medicare HMO | Attending: Emergency Medicine | Admitting: Emergency Medicine

## 2018-04-19 ENCOUNTER — Encounter (HOSPITAL_COMMUNITY): Payer: Self-pay | Admitting: Emergency Medicine

## 2018-04-19 DIAGNOSIS — F329 Major depressive disorder, single episode, unspecified: Secondary | ICD-10-CM | POA: Diagnosis not present

## 2018-04-19 DIAGNOSIS — G20 Parkinson's disease: Secondary | ICD-10-CM | POA: Diagnosis not present

## 2018-04-19 DIAGNOSIS — F32A Depression, unspecified: Secondary | ICD-10-CM

## 2018-04-19 DIAGNOSIS — R404 Transient alteration of awareness: Secondary | ICD-10-CM | POA: Diagnosis not present

## 2018-04-19 DIAGNOSIS — Z7982 Long term (current) use of aspirin: Secondary | ICD-10-CM | POA: Insufficient documentation

## 2018-04-19 DIAGNOSIS — I10 Essential (primary) hypertension: Secondary | ICD-10-CM | POA: Diagnosis not present

## 2018-04-19 DIAGNOSIS — F0280 Dementia in other diseases classified elsewhere without behavioral disturbance: Secondary | ICD-10-CM | POA: Diagnosis not present

## 2018-04-19 DIAGNOSIS — F319 Bipolar disorder, unspecified: Secondary | ICD-10-CM | POA: Diagnosis not present

## 2018-04-19 DIAGNOSIS — E039 Hypothyroidism, unspecified: Secondary | ICD-10-CM | POA: Insufficient documentation

## 2018-04-19 DIAGNOSIS — F0390 Unspecified dementia without behavioral disturbance: Secondary | ICD-10-CM | POA: Insufficient documentation

## 2018-04-19 DIAGNOSIS — M6281 Muscle weakness (generalized): Secondary | ICD-10-CM | POA: Diagnosis not present

## 2018-04-19 DIAGNOSIS — Z79899 Other long term (current) drug therapy: Secondary | ICD-10-CM | POA: Diagnosis not present

## 2018-04-19 DIAGNOSIS — R45851 Suicidal ideations: Secondary | ICD-10-CM | POA: Insufficient documentation

## 2018-04-19 DIAGNOSIS — G309 Alzheimer's disease, unspecified: Secondary | ICD-10-CM | POA: Insufficient documentation

## 2018-04-19 DIAGNOSIS — Z950 Presence of cardiac pacemaker: Secondary | ICD-10-CM | POA: Insufficient documentation

## 2018-04-19 DIAGNOSIS — G300 Alzheimer's disease with early onset: Secondary | ICD-10-CM | POA: Diagnosis not present

## 2018-04-19 DIAGNOSIS — R2689 Other abnormalities of gait and mobility: Secondary | ICD-10-CM | POA: Diagnosis not present

## 2018-04-19 DIAGNOSIS — N390 Urinary tract infection, site not specified: Secondary | ICD-10-CM | POA: Diagnosis not present

## 2018-04-19 DIAGNOSIS — R41841 Cognitive communication deficit: Secondary | ICD-10-CM | POA: Diagnosis not present

## 2018-04-19 DIAGNOSIS — I4819 Other persistent atrial fibrillation: Secondary | ICD-10-CM | POA: Diagnosis not present

## 2018-04-19 DIAGNOSIS — F29 Unspecified psychosis not due to a substance or known physiological condition: Secondary | ICD-10-CM | POA: Diagnosis not present

## 2018-04-19 LAB — COMPREHENSIVE METABOLIC PANEL
ALT: 16 U/L (ref 0–44)
AST: 23 U/L (ref 15–41)
Albumin: 3.3 g/dL — ABNORMAL LOW (ref 3.5–5.0)
Alkaline Phosphatase: 103 U/L (ref 38–126)
Anion gap: 9 (ref 5–15)
BILIRUBIN TOTAL: 0.4 mg/dL (ref 0.3–1.2)
BUN: 14 mg/dL (ref 8–23)
CO2: 27 mmol/L (ref 22–32)
Calcium: 9.2 mg/dL (ref 8.9–10.3)
Chloride: 104 mmol/L (ref 98–111)
Creatinine, Ser: 1.01 mg/dL — ABNORMAL HIGH (ref 0.44–1.00)
GFR calc Af Amer: >60 mL/min (ref >60)
GFR calc non Af Amer: 55 mL/min — ABNORMAL LOW (ref >60)
GLUCOSE: 125 mg/dL — AB (ref 70–99)
Potassium: 4 mmol/L (ref 3.5–5.1)
Sodium: 140 mmol/L (ref 135–145)
TOTAL PROTEIN: 6.9 g/dL (ref 6.5–8.1)

## 2018-04-19 LAB — CBC WITH DIFFERENTIAL/PLATELET
Abs Immature Granulocytes: 0.05 10*3/uL (ref 0.00–0.07)
Basophils Absolute: 0 10*3/uL (ref 0.0–0.1)
Basophils Relative: 1 %
Eosinophils Absolute: 0.2 10*3/uL (ref 0.0–0.5)
Eosinophils Relative: 4 %
HEMATOCRIT: 41.4 % (ref 36.0–46.0)
Hemoglobin: 13.3 g/dL (ref 12.0–15.0)
Immature Granulocytes: 1 %
Lymphocytes Relative: 33 %
Lymphs Abs: 2.1 10*3/uL (ref 0.7–4.0)
MCH: 28.9 pg (ref 26.0–34.0)
MCHC: 32.1 g/dL (ref 30.0–36.0)
MCV: 89.8 fL (ref 80.0–100.0)
Monocytes Absolute: 0.5 10*3/uL (ref 0.1–1.0)
Monocytes Relative: 8 %
Neutro Abs: 3.3 10*3/uL (ref 1.7–7.7)
Neutrophils Relative %: 53 %
Platelets: 195 10*3/uL (ref 150–400)
RBC: 4.61 MIL/uL (ref 3.87–5.11)
RDW: 14.9 % (ref 11.5–15.5)
WBC: 6.2 10*3/uL (ref 4.0–10.5)
nRBC: 0 % (ref 0.0–0.2)

## 2018-04-19 LAB — ETHANOL: Alcohol, Ethyl (B): <10 mg/dL (ref <10)

## 2018-04-19 NOTE — Discharge Instructions (Addendum)
Patient is denying any suicidal thoughts currently.  She does have depression.  She does report that she has had some recent suicidal thoughts but currently is denying these thoughts.  She needs to follow-up with her behavioral health counselor.

## 2018-04-19 NOTE — ED Provider Notes (Signed)
[facility] Provider Note   CSN: [id number] Arrival date & time: 04/19/18  1539     History   Chief Complaint Chief Complaint  Patient presents with  . Suicidal    HPI Winferd HumphreyCarolyn A Gwynne is a 73 y.o. female.  Patient is a 73 year old female with a history of dementia, bipolar disorder, hypertension, Parkinson's disease who presents with suicidal ideations.  She is seeing a counselor currently.  She resides at a nursing facility.  Per the nursing home staff, she has been making threats of wanting to kill her self.  They state that she has tried overdose in the past although currently she does not have access to medications.  Patient states that she does not want to go on living and that she has found a physician in an office who will help her with physician assisted suicide.  She will not name who that physician is.  She has chronic issues with back pain and arthritis but she says she does not have any recent illness or change in her chronic issues.     Past Medical History:  Diagnosis Date  . Alzheimer disease (HCC)   . Anxiety   . Bipolar disorder, unspecified (HCC)   . Dementia (HCC)   . Depressive disorder   . GERD (gastroesophageal reflux disease)   . Hypercholesteremia   . Hypertension   . Insomnia   . Lumbar disc disease    degenerative  . Parkinson disease (HCC)   . Restless leg syndrome   . Thyroid disease    hypothyroid  . UTI (urinary tract infection)   . Weakness     Patient Active Problem List   Diagnosis Date Noted  . Pacemaker   . Tachy-brady syndrome (HCC) 04/10/2018  . Chest pain, rule out acute myocardial infarction 04/09/2018  . Goals of care, counseling/discussion   . Palliative care by specialist   . Persistent atrial fibrillation 03/28/2018  . Sinus pause 03/28/2018  . Acute lower UTI 03/28/2018  . Dehydration 03/26/2018  . Alzheimer's dementia (HCC) 03/26/2018  . Essential hypertension 03/26/2018  .  Hyperlipidemia 03/26/2018  . Hypothyroidism 03/26/2018  . AMS (altered mental status) 03/25/2018    Past Surgical History:  Procedure Laterality Date  . PACEMAKER IMPLANT N/A 04/10/2018   Procedure: PACEMAKER IMPLANT;  Surgeon: Regan Lemmingamnitz, Will Martin, MD;  [facility] IN[facility];  Service: Cardiovascular;  Laterality: N/A;     OB History   No obstetric history on file.      Home Medications    Prior to Admission medications   Medication Sig Start Date End Date Taking? Authorizing Provider  aspirin EC 81 MG tablet Take 81 mg by mouth daily.    [provider]  carbidopa-levodopa (SINEMET IR) 25-100 MG tablet Take 1 tablet by mouth 3 (three) times daily.    [provider]  carvedilol (COREG) 6.25 MG tablet Take 1 tablet (6.25 mg total) by mouth 2 (two) times daily with a meal. 04/11/18   Gherghe, Daylene Katayamaostin M, MD  cyclobenzaprine (FLEXERIL) 5 MG tablet Take 5 mg by mouth at bedtime.    [provider]  fenofibrate (TRICOR) 48 MG tablet Take 48 mg by mouth daily.    [provider]  furosemide (LASIX) 20 MG tablet Take 1 tablet (20 mg total) by mouth as needed for edema. Patient taking differently: Take 20 mg by mouth daily as needed for edema.  03/31/18   Laverna PeaceNettey, Shayla D, MD  LACTOBACILLUS PO  Take 1 capsule by mouth daily.    [provider]  levothyroxine (SYNTHROID, LEVOTHROID) 50 MCG tablet Take 50 mcg by mouth daily before breakfast.    [provider]  OLANZapine (ZYPREXA) 15 MG tablet Take 15 mg by mouth at bedtime.    [provider]  oxybutynin (DITROPAN-XL) 10 MG 24 hr tablet Take 10 mg by mouth at bedtime.    [provider]  pantoprazole (PROTONIX) 40 MG tablet Take 1 tablet (40 mg total) by mouth daily. 04/10/18   Almon HerculesGonfa, Taye T, MD  rOPINIRole (REQUIP) 0.5 MG tablet Take 0.5 mg by mouth at bedtime.    [provider]  temazepam (RESTORIL) 7.5 MG capsule Take 1 capsule (7.5 mg total) by mouth at  bedtime. 04/11/18   Leatha GildingGherghe, Costin M, MD    Family History Family History  Problem Relation Age of Onset  . Diabetes Mother   . Heart disease Mother   . Heart disease Father     Social History Social History   Tobacco Use  . Smoking status: Unknown If Ever Smoked  . Smokeless tobacco: Never Used  Substance Use Topics  . Alcohol use: Not Currently  . Drug use: Not Currently     Allergies   Demerol [meperidine hcl]   Review of Systems Review of Systems  Unable to perform ROS: Dementia     Physical Exam Updated Vital Signs BP 111/69 (BP Location: Right Arm)   Pulse 77   Temp 97.7 F (36.5 C) (Oral)   Resp 15   SpO2 100%   Physical Exam Constitutional:      Appearance: She is well-developed.  HENT:     Head: Normocephalic and atraumatic.  Eyes:     Pupils: Pupils are equal, round, and reactive to light.  Neck:     Musculoskeletal: Normal range of motion and neck supple.  Cardiovascular:     Rate and Rhythm: Normal rate and regular rhythm.     Heart sounds: Normal heart sounds.  Pulmonary:     Effort: Pulmonary effort is normal. No respiratory distress.     Breath sounds: Normal breath sounds. No wheezing or rales.  Chest:     Chest wall: No tenderness.  Abdominal:     General: Bowel sounds are normal.     Palpations: Abdomen is soft.     Tenderness: There is no abdominal tenderness. There is no guarding or rebound.  Musculoskeletal: Normal range of motion.  Lymphadenopathy:     Cervical: No cervical adenopathy.  Skin:    General: Skin is warm and dry.     Findings: No rash.  Neurological:     Mental Status: She is alert.     Comments: Patient is oriented to person and place only.  She is confused to the year and the month.  She knows that she is in the hospital but she does not know the name of the hospital.  She seems to be moving all extremities symmetrically, no facial drooping, no aphasia.      ED Treatments / Results  Labs (all labs  ordered are listed, but only abnormal results are displayed) Labs Reviewed  COMPREHENSIVE METABOLIC PANEL - Abnormal; Notable for the following components:      Result Value   Glucose, Bld 125 (*)    Creatinine, Ser 1.01 (*)    Albumin 3.3 (*)    GFR calc non Af Amer 55 (*)    All other components within normal limits  ETHANOL  CBC WITH DIFFERENTIAL/PLATELET  RAPID URINE DRUG SCREEN, HOSP PERFORMED  URINALYSIS, ROUTINE W REFLEX MICROSCOPIC    EKG None  Radiology No results found.  Procedures Procedures (including critical care time)  Medications Ordered in ED Medications - No data to display   Initial Impression / Assessment and Plan / ED Course  I have reviewed the triage vital signs and the nursing notes.  Pertinent labs & imaging results that were available during my care of the patient were reviewed by me and considered in my medical decision making (see chart for details).     Patient presents with depression with suicidal ideations although currently she is denying any suicidal ideations.  She states that she did report that she had found a physician to help her with the physician assistant suicide but denies any current thoughts of wanting to die.  She has been assessed by behavioral health and was denying any suicidal ideations to them as well.  She has been cleared by them for discharge.  She is in a nursing facility who has control of her medications and her daughter is her healthcare power of attorney so she appears to be going back to a safe environment.  Return precautions were given.  She can follow-up with her behavioral health counselor at a nursing facility.  Final Clinical Impressions(s) / ED Diagnoses   Final diagnoses:  Depression, unspecified depression type    ED Discharge Orders    None       Rolan Bucco, MD 04/19/18 [id number]

## 2018-04-19 NOTE — ED Notes (Signed)
Pt. Placed on purewick.  

## 2018-04-19 NOTE — BH Assessment (Signed)
Tele Assessment Note   Patient Name: MALARIE CRESAP MRN: [id number] Referring Physician: Rolan Bucco Location of Patient:  Location of Provider: Behavioral Health TTS Department  JACQUELIN KOLLMAR is an 73 y.o. female who has a long history of depression and persistent suicidal ideation due to her chronic medical conditions and having to live in a nursing facility.  Patient states that she has three suicide attempts by history and has indicated that she would overdose on pills, but patient has no access to any medication.  She also indicated that she would kill herself by physician assisted suicide which is not a possibility.  Patient states that she becomes suicidal when she is hurting or cannot do for herself, but denies current suicidal ideation.  She has been living in her current nursing facility for the past eight months and states that she is seeing the staff psychiatrist and psychologist there.  Patient states that if has been helping her to see them.  Patient states that she was at [facility] recently for her depression as well as getting a pacemaker 2-3 months ago.  Patient denies HI, but nursing home states that she does experience hallucinations and delusions which accompany her dementia diagnosis.  Patient is alert and oriented, able to contract for safety.  Patient has no plan of harm that she could place into action and she is in a monitored facility.  Patient's judgment, insight and impulse control are impaired by her dementia.  Her mood is depressed and her affect is flat.  Her remote memory is intact, but recent is impaired.  Diagnosis: MDD Recurrent Severe F32.9  Past Medical History:  Past Medical History:  Diagnosis Date  . Alzheimer disease (HCC)   . Anxiety   . Bipolar disorder, unspecified (HCC)   . Dementia (HCC)   . Depressive disorder   . GERD (gastroesophageal reflux disease)   . Hypercholesteremia   . Hypertension   . Insomnia   . Lumbar disc  disease    degenerative  . Parkinson disease (HCC)   . Restless leg syndrome   . Thyroid disease    hypothyroid  . UTI (urinary tract infection)   . Weakness     Past Surgical History:  Procedure Laterality Date  . PACEMAKER IMPLANT N/A 04/10/2018   Procedure: PACEMAKER IMPLANT;  Surgeon: Regan Lemming, MD;  Location: [facility] IN[facility];  Service: Cardiovascular;  Laterality: N/A;    Family History:  Family History  Problem Relation Age of Onset  . Diabetes Mother   . Heart disease Mother   . Heart disease Father     Social History:  has an unknown smoking status. She has never used smokeless tobacco. She reports previous alcohol use. She reports previous drug use.  Additional Social History:  Alcohol / Drug Use Pain Medications: see MAR Prescriptions: see MAR Over the Counter: see MAR History of alcohol / drug use?: No history of alcohol / drug abuse Longest period of sobriety (when/how long): N/A  CIWA: CIWA-Ar BP: 113/62 Pulse Rate: 77 COWS:    Allergies:  Allergies  Allergen Reactions  . Demerol [Meperidine Hcl] Other (See Comments)    Unknown reaction - per [facility]    Home Medications: (Not in a hospital admission)   OB/GYN Status:  No LMP recorded. Patient is postmenopausal.  General Assessment Data Location of Assessment: [facility] ED TTS Assessment: In system Is this a Tele or Face-to-Face Assessment?: Tele Assessment Is this an Initial Assessment or a Re-assessment  for this encounter?: Initial Assessment Patient Accompanied by:: N/A Language Other than English: No Living Arrangements: In Assisted Living/Nursing Home (Comment: Name of Nursing Home What gender do you identify as?: Female Marital status: Widowed Living Arrangements: Other (Comment)(nursing facility) Can pt return to current living arrangement?: Yes Admission Status: Voluntary Is patient capable of signing voluntary admission?: Yes Referral Source: MD Insurance type: Medicare      Crisis Care Plan Living Arrangements: Other (Comment)(nursing facility) Legal Guardian: Other:(self, but daughter is medical POA)  Education Status Is patient currently in school?: No Is the patient employed, unemployed or receiving disability?: Receiving disability income(retired)  Risk to self with the past 6 months Suicidal Ideation: Yes-Currently Present Has patient been a risk to self within the past 6 months prior to admission? : No Suicidal Intent: Yes-Currently Present(pt has intent, but no means to pull it off) Has patient had any suicidal intent within the past 6 months prior to admission? : Yes Is patient at risk for suicide?: No Suicidal Plan?: Yes-Currently Present Has patient had any suicidal plan within the past 6 months prior to admission? : Yes Specify Current Suicidal Plan: physician assisted suicide Access to Means: No What has been your use of drugs/alcohol within the last 12 months?: (none) Previous Attempts/Gestures: Yes How many times?: 3 Other Self Harm Risks: (none) Triggers for Past Attempts: None known Intentional Self Injurious Behavior: None Family Suicide History: No Recent stressful life event(s): Other (Comment)(daughter hurt her feelings) Persecutory voices/beliefs?: No Depression: Yes Depression Symptoms: Despondent, Insomnia, Isolating, Loss of interest in usual pleasures, Feeling worthless/self pity Substance abuse history and/or treatment for substance abuse?: No Suicide prevention information given to non-admitted patients: Not applicable  Risk to Others within the past 6 months Homicidal Ideation: No Does patient have any lifetime risk of violence toward others beyond the six months prior to admission? : No Thoughts of Harm to Others: No Current Homicidal Intent: No Current Homicidal Plan: No Access to Homicidal Means: No Identified Victim: none History of harm to others?: No Assessment of Violence: None Noted Violent Behavior  Description: none Does patient have access to weapons?: No Criminal Charges Pending?: No Does patient have a court date: No Is patient on probation?: No  Psychosis Hallucinations: None noted Delusions: None noted  Mental Status Report Appearance/Hygiene: Disheveled Eye Contact: Good Motor Activity: Psychomotor retardation Speech: Logical/coherent Level of Consciousness: Alert Mood: Depressed, Apathetic Affect: Flat Anxiety Level: Minimal Thought Processes: Coherent, Relevant Judgement: Impaired Orientation: Person, Place, Time, Situation  Cognitive Functioning Concentration: Decreased Memory: Remote Intact, Recent Impaired Is patient IDD: No Insight: Poor Impulse Control: Good Appetite: Good Have you had any weight changes? : No Change Sleep: No Change Total Hours of Sleep: 8 Vegetative Symptoms: Decreased grooming  ADLScreening [facility] Assessment Services) Patient's cognitive ability adequate to safely complete daily activities?: Yes Patient able to express need for assistance with ADLs?: Yes Independently performs ADLs?: Yes (appropriate for developmental age)  Prior Inpatient Therapy Prior Inpatient Therapy: Yes Prior Therapy Dates: (2 mths ago) Prior Therapy Facilty/Provider(s): [place] Reason for Treatment: depression  Prior Outpatient Therapy Prior Outpatient Therapy: Yes Prior Therapy Dates: (active) Prior Therapy Facilty/Provider(s): seeing psychiatris at nursing facility Reason for Treatment: depression Does patient have an ACCT team?: No Does patient have Intensive In-House Services?  : No Does patient have [facility] services? : No Does patient have P4CC services?: No  ADL Screening (condition at time of admission) Patient's cognitive ability adequate to safely complete daily activities?: Yes Is the patient deaf or  have difficulty hearing?: No Does the patient have difficulty seeing, even when wearing glasses/contacts?: No Does the patient have  difficulty concentrating, remembering, or making decisions?: No Patient able to express need for assistance with ADLs?: Yes Does the patient have difficulty dressing or bathing?: No Independently performs ADLs?: Yes (appropriate for developmental age) Does the patient have difficulty walking or climbing stairs?: No Weakness of Legs: None Weakness of Arms/Hands: None  Home Assistive Devices/Equipment Home Assistive Devices/Equipment: None  Therapy Consults (therapy consults require a physician order) PT Evaluation Needed: No OT Evalulation Needed: No SLP Evaluation Needed: No Abuse/Neglect Assessment (Assessment to be complete while patient is alone) Abuse/Neglect Assessment Can Be Completed: Yes Physical Abuse: Denies Verbal Abuse: Denies Sexual Abuse: Denies Exploitation of patient/patient's resources: Denies Self-Neglect: Denies Values / Beliefs Cultural Requests During Hospitalization: None Spiritual Requests During Hospitalization: None Consults Spiritual Care Consult Needed: No Social Work Consult Needed: No Merchant navy officerAdvance Directives (For Healthcare) Does Patient Have a Medical Advance Directive?: No Would patient like information on creating a medical advance directive?: No - Patient declined Nutrition Screen- [facility] Adult/[facility]/AP Has the patient recently lost weight without trying?: No Has the patient been eating poorly because of a decreased appetite?: No Malnutrition Screening Tool Score: 0        Disposition: Per Reola Calkinsravis Money, NP, patient does not meet inpatient admission criteria and can return back to the nursing facility where she resides.   Disposition Initial Assessment Completed for this Encounter: Yes Patient referred to: (back to nursing facility)  This service was provided via telemedicine using a 2-way, interactive audio and video technology.  Names of all persons participating in this telemedicine service and their role in this encounter. Name: Charlann BoxerCarolyn Boehringer  Role: patient  Name: Dannielle Huhanny Caillou Minus Role: TTS  Name: Feliz Beamravis Money Role: FNP  Name:  Role:     Daphene CalamityDanny J Dalaysia Harms 04/19/2018 7:09 PM

## 2018-04-19 NOTE — ED Triage Notes (Signed)
Pt arrives to ED from [facility] with complaints of wanting to end her life with physician assisted suicide. EMS reports pt has been seeing physiologist for recent SI thoughts.

## 2018-04-19 NOTE — Progress Notes (Signed)
Patient is seen by me via tele-psych and I have consulted with Dr. Lucianne Muss.  Patient currently denies any suicidal or homicidal ideations reports a history of hallucinations but no currently.  She states that she was started on the medication at the nursing facility that is greatly diminished her hallucinations but she does have them once in a while.  She reports being diagnosed with Parkinson's disease as well as Alzheimer's.  Patient also reports that she has some type of disease with her eyes that is causing her to lose her vision.  She reports that when she made a comment about suicide her plan was to do physician assisted suicide.  She is informed that that is not legal in [place] and she stated that she could possibly go to another state in the case of emergency but could not tell me the emergency that would possibly work to get her out of the nursing facility and to another state to see a physician that would do a physician assisted suicide.  Patient stays in a nursing facility where her medications are controlled and she does not have access to this.  She also reported that she has a psychiatrist and a psychologist at the nursing home that assist with treatment for her history of bipolar disorder.  Patient continually denies having any current suicidal or homicidal ideations and states that she does not feel that she needs to stay in the hospital.  She is upset about her daughter taken away her rights and being her healthcare power of attorney and accuses her daughter of taking all of her Jessica Hayes.  Patient agrees to go back to the nursing home and states that she will be safe.  At this time patient does not meet inpatient criteria and is psychiatrically cleared.  I have contacted Dr. Fredderick Phenix and notified her of the recommendations.

## 2018-04-19 NOTE — ED Notes (Signed)
[facility] CALLED  °

## 2018-04-20 DIAGNOSIS — Z7401 Bed confinement status: Secondary | ICD-10-CM | POA: Diagnosis not present

## 2018-04-20 DIAGNOSIS — M2550 Pain in unspecified joint: Secondary | ICD-10-CM | POA: Diagnosis not present

## 2018-04-20 DIAGNOSIS — F29 Unspecified psychosis not due to a substance or known physiological condition: Secondary | ICD-10-CM | POA: Diagnosis not present

## 2018-04-20 NOTE — ED Notes (Signed)
Pt paperwork given to [facility], report given to [facility].

## 2018-04-20 NOTE — ED Notes (Signed)
Attempted to call report to facility.   

## 2018-04-22 DIAGNOSIS — I4819 Other persistent atrial fibrillation: Secondary | ICD-10-CM | POA: Diagnosis not present

## 2018-04-22 DIAGNOSIS — R2689 Other abnormalities of gait and mobility: Secondary | ICD-10-CM | POA: Diagnosis not present

## 2018-04-22 DIAGNOSIS — M6281 Muscle weakness (generalized): Secondary | ICD-10-CM | POA: Diagnosis not present

## 2018-04-22 DIAGNOSIS — F0280 Dementia in other diseases classified elsewhere without behavioral disturbance: Secondary | ICD-10-CM | POA: Diagnosis not present

## 2018-04-22 DIAGNOSIS — F319 Bipolar disorder, unspecified: Secondary | ICD-10-CM | POA: Diagnosis not present

## 2018-04-22 DIAGNOSIS — G20 Parkinson's disease: Secondary | ICD-10-CM | POA: Diagnosis not present

## 2018-04-22 DIAGNOSIS — R41841 Cognitive communication deficit: Secondary | ICD-10-CM | POA: Diagnosis not present

## 2018-04-22 DIAGNOSIS — G300 Alzheimer's disease with early onset: Secondary | ICD-10-CM | POA: Diagnosis not present

## 2018-04-23 DIAGNOSIS — G300 Alzheimer's disease with early onset: Secondary | ICD-10-CM | POA: Diagnosis not present

## 2018-04-23 DIAGNOSIS — M6281 Muscle weakness (generalized): Secondary | ICD-10-CM | POA: Diagnosis not present

## 2018-04-23 DIAGNOSIS — R5381 Other malaise: Secondary | ICD-10-CM | POA: Diagnosis not present

## 2018-04-23 DIAGNOSIS — F0280 Dementia in other diseases classified elsewhere without behavioral disturbance: Secondary | ICD-10-CM | POA: Diagnosis not present

## 2018-04-23 DIAGNOSIS — G20 Parkinson's disease: Secondary | ICD-10-CM | POA: Diagnosis not present

## 2018-04-23 DIAGNOSIS — R2689 Other abnormalities of gait and mobility: Secondary | ICD-10-CM | POA: Diagnosis not present

## 2018-04-23 DIAGNOSIS — I4819 Other persistent atrial fibrillation: Secondary | ICD-10-CM | POA: Diagnosis not present

## 2018-04-23 DIAGNOSIS — F419 Anxiety disorder, unspecified: Secondary | ICD-10-CM | POA: Diagnosis not present

## 2018-04-23 DIAGNOSIS — F329 Major depressive disorder, single episode, unspecified: Secondary | ICD-10-CM | POA: Diagnosis not present

## 2018-04-23 DIAGNOSIS — F319 Bipolar disorder, unspecified: Secondary | ICD-10-CM | POA: Diagnosis not present

## 2018-04-23 DIAGNOSIS — R41841 Cognitive communication deficit: Secondary | ICD-10-CM | POA: Diagnosis not present

## 2018-04-23 DIAGNOSIS — R45851 Suicidal ideations: Secondary | ICD-10-CM | POA: Diagnosis not present

## 2018-04-24 ENCOUNTER — Ambulatory Visit (INDEPENDENT_AMBULATORY_CARE_PROVIDER_SITE_OTHER): Payer: Medicare HMO | Admitting: Nurse Practitioner

## 2018-04-24 DIAGNOSIS — F0280 Dementia in other diseases classified elsewhere without behavioral disturbance: Secondary | ICD-10-CM | POA: Diagnosis not present

## 2018-04-24 DIAGNOSIS — M6281 Muscle weakness (generalized): Secondary | ICD-10-CM | POA: Diagnosis not present

## 2018-04-24 DIAGNOSIS — R41841 Cognitive communication deficit: Secondary | ICD-10-CM | POA: Diagnosis not present

## 2018-04-24 DIAGNOSIS — R001 Bradycardia, unspecified: Secondary | ICD-10-CM

## 2018-04-24 DIAGNOSIS — G20 Parkinson's disease: Secondary | ICD-10-CM | POA: Diagnosis not present

## 2018-04-24 DIAGNOSIS — G300 Alzheimer's disease with early onset: Secondary | ICD-10-CM | POA: Diagnosis not present

## 2018-04-24 DIAGNOSIS — I4819 Other persistent atrial fibrillation: Secondary | ICD-10-CM | POA: Diagnosis not present

## 2018-04-24 DIAGNOSIS — F319 Bipolar disorder, unspecified: Secondary | ICD-10-CM | POA: Diagnosis not present

## 2018-04-24 DIAGNOSIS — R2689 Other abnormalities of gait and mobility: Secondary | ICD-10-CM | POA: Diagnosis not present

## 2018-04-24 LAB — CUP PACEART INCLINIC DEVICE CHECK
Date Time Interrogation Session: 20200205113414
Implantable Lead Implant Date: 20200122
Implantable Lead Implant Date: 20200122
Implantable Lead Location: 753859
Implantable Lead Location: 753860
Implantable Pulse Generator Implant Date: 20200122
Pulse Gen Serial Number: 9101187

## 2018-04-24 NOTE — Progress Notes (Signed)
Wound check appointment. Steri-strips removed. Wound without redness or edema. Incision edges approximated, wound well healed. Normal device function. Thresholds, sensing, and impedances consistent with implant measurements. Device programmed at 3.5V/auto capture programmed on for extra safety margin until 3 month visit. Histogram distribution appropriate for patient and level of activity. No high ventricular rates noted. 2.9% AF burden. Not felt to be a candidate for [facility]. Patient educated about wound care, arm mobility, lifting restrictions. ROV in 3 months with implanting physician.

## 2018-04-25 DIAGNOSIS — M6281 Muscle weakness (generalized): Secondary | ICD-10-CM | POA: Diagnosis not present

## 2018-04-25 DIAGNOSIS — G20 Parkinson's disease: Secondary | ICD-10-CM | POA: Diagnosis not present

## 2018-04-25 DIAGNOSIS — R2689 Other abnormalities of gait and mobility: Secondary | ICD-10-CM | POA: Diagnosis not present

## 2018-04-25 DIAGNOSIS — R41841 Cognitive communication deficit: Secondary | ICD-10-CM | POA: Diagnosis not present

## 2018-04-25 DIAGNOSIS — F319 Bipolar disorder, unspecified: Secondary | ICD-10-CM | POA: Diagnosis not present

## 2018-04-25 DIAGNOSIS — F0280 Dementia in other diseases classified elsewhere without behavioral disturbance: Secondary | ICD-10-CM | POA: Diagnosis not present

## 2018-04-25 DIAGNOSIS — I4819 Other persistent atrial fibrillation: Secondary | ICD-10-CM | POA: Diagnosis not present

## 2018-04-25 DIAGNOSIS — G300 Alzheimer's disease with early onset: Secondary | ICD-10-CM | POA: Diagnosis not present

## 2018-04-26 DIAGNOSIS — F319 Bipolar disorder, unspecified: Secondary | ICD-10-CM | POA: Diagnosis not present

## 2018-04-26 DIAGNOSIS — M6281 Muscle weakness (generalized): Secondary | ICD-10-CM | POA: Diagnosis not present

## 2018-04-26 DIAGNOSIS — R41841 Cognitive communication deficit: Secondary | ICD-10-CM | POA: Diagnosis not present

## 2018-04-26 DIAGNOSIS — I4819 Other persistent atrial fibrillation: Secondary | ICD-10-CM | POA: Diagnosis not present

## 2018-04-26 DIAGNOSIS — G300 Alzheimer's disease with early onset: Secondary | ICD-10-CM | POA: Diagnosis not present

## 2018-04-26 DIAGNOSIS — F0280 Dementia in other diseases classified elsewhere without behavioral disturbance: Secondary | ICD-10-CM | POA: Diagnosis not present

## 2018-04-26 DIAGNOSIS — G20 Parkinson's disease: Secondary | ICD-10-CM | POA: Diagnosis not present

## 2018-04-26 DIAGNOSIS — R2689 Other abnormalities of gait and mobility: Secondary | ICD-10-CM | POA: Diagnosis not present

## 2018-04-27 DIAGNOSIS — R41841 Cognitive communication deficit: Secondary | ICD-10-CM | POA: Diagnosis not present

## 2018-04-27 DIAGNOSIS — F0280 Dementia in other diseases classified elsewhere without behavioral disturbance: Secondary | ICD-10-CM | POA: Diagnosis not present

## 2018-04-27 DIAGNOSIS — G300 Alzheimer's disease with early onset: Secondary | ICD-10-CM | POA: Diagnosis not present

## 2018-04-27 DIAGNOSIS — I4819 Other persistent atrial fibrillation: Secondary | ICD-10-CM | POA: Diagnosis not present

## 2018-04-27 DIAGNOSIS — G20 Parkinson's disease: Secondary | ICD-10-CM | POA: Diagnosis not present

## 2018-04-27 DIAGNOSIS — R2689 Other abnormalities of gait and mobility: Secondary | ICD-10-CM | POA: Diagnosis not present

## 2018-04-27 DIAGNOSIS — M6281 Muscle weakness (generalized): Secondary | ICD-10-CM | POA: Diagnosis not present

## 2018-04-27 DIAGNOSIS — F319 Bipolar disorder, unspecified: Secondary | ICD-10-CM | POA: Diagnosis not present

## 2018-04-28 DIAGNOSIS — G20 Parkinson's disease: Secondary | ICD-10-CM | POA: Diagnosis not present

## 2018-04-28 DIAGNOSIS — M6281 Muscle weakness (generalized): Secondary | ICD-10-CM | POA: Diagnosis not present

## 2018-04-28 DIAGNOSIS — G300 Alzheimer's disease with early onset: Secondary | ICD-10-CM | POA: Diagnosis not present

## 2018-04-28 DIAGNOSIS — F319 Bipolar disorder, unspecified: Secondary | ICD-10-CM | POA: Diagnosis not present

## 2018-04-28 DIAGNOSIS — F0280 Dementia in other diseases classified elsewhere without behavioral disturbance: Secondary | ICD-10-CM | POA: Diagnosis not present

## 2018-04-28 DIAGNOSIS — R41841 Cognitive communication deficit: Secondary | ICD-10-CM | POA: Diagnosis not present

## 2018-04-28 DIAGNOSIS — I4819 Other persistent atrial fibrillation: Secondary | ICD-10-CM | POA: Diagnosis not present

## 2018-04-28 DIAGNOSIS — R2689 Other abnormalities of gait and mobility: Secondary | ICD-10-CM | POA: Diagnosis not present

## 2018-04-29 DIAGNOSIS — R41841 Cognitive communication deficit: Secondary | ICD-10-CM | POA: Diagnosis not present

## 2018-04-29 DIAGNOSIS — G300 Alzheimer's disease with early onset: Secondary | ICD-10-CM | POA: Diagnosis not present

## 2018-04-29 DIAGNOSIS — M6281 Muscle weakness (generalized): Secondary | ICD-10-CM | POA: Diagnosis not present

## 2018-04-29 DIAGNOSIS — F319 Bipolar disorder, unspecified: Secondary | ICD-10-CM | POA: Diagnosis not present

## 2018-04-29 DIAGNOSIS — R2689 Other abnormalities of gait and mobility: Secondary | ICD-10-CM | POA: Diagnosis not present

## 2018-04-29 DIAGNOSIS — I4819 Other persistent atrial fibrillation: Secondary | ICD-10-CM | POA: Diagnosis not present

## 2018-04-29 DIAGNOSIS — G20 Parkinson's disease: Secondary | ICD-10-CM | POA: Diagnosis not present

## 2018-04-29 DIAGNOSIS — F0280 Dementia in other diseases classified elsewhere without behavioral disturbance: Secondary | ICD-10-CM | POA: Diagnosis not present

## 2018-04-30 DIAGNOSIS — G20 Parkinson's disease: Secondary | ICD-10-CM | POA: Diagnosis not present

## 2018-04-30 DIAGNOSIS — F329 Major depressive disorder, single episode, unspecified: Secondary | ICD-10-CM | POA: Diagnosis not present

## 2018-04-30 DIAGNOSIS — R2689 Other abnormalities of gait and mobility: Secondary | ICD-10-CM | POA: Diagnosis not present

## 2018-04-30 DIAGNOSIS — G300 Alzheimer's disease with early onset: Secondary | ICD-10-CM | POA: Diagnosis not present

## 2018-04-30 DIAGNOSIS — I4819 Other persistent atrial fibrillation: Secondary | ICD-10-CM | POA: Diagnosis not present

## 2018-04-30 DIAGNOSIS — M6281 Muscle weakness (generalized): Secondary | ICD-10-CM | POA: Diagnosis not present

## 2018-04-30 DIAGNOSIS — R41841 Cognitive communication deficit: Secondary | ICD-10-CM | POA: Diagnosis not present

## 2018-04-30 DIAGNOSIS — F419 Anxiety disorder, unspecified: Secondary | ICD-10-CM | POA: Diagnosis not present

## 2018-04-30 DIAGNOSIS — F0280 Dementia in other diseases classified elsewhere without behavioral disturbance: Secondary | ICD-10-CM | POA: Diagnosis not present

## 2018-04-30 DIAGNOSIS — F319 Bipolar disorder, unspecified: Secondary | ICD-10-CM | POA: Diagnosis not present

## 2018-05-01 DIAGNOSIS — I4819 Other persistent atrial fibrillation: Secondary | ICD-10-CM | POA: Diagnosis not present

## 2018-05-01 DIAGNOSIS — F319 Bipolar disorder, unspecified: Secondary | ICD-10-CM | POA: Diagnosis not present

## 2018-05-01 DIAGNOSIS — G20 Parkinson's disease: Secondary | ICD-10-CM | POA: Diagnosis not present

## 2018-05-01 DIAGNOSIS — F0280 Dementia in other diseases classified elsewhere without behavioral disturbance: Secondary | ICD-10-CM | POA: Diagnosis not present

## 2018-05-01 DIAGNOSIS — R41841 Cognitive communication deficit: Secondary | ICD-10-CM | POA: Diagnosis not present

## 2018-05-01 DIAGNOSIS — R2689 Other abnormalities of gait and mobility: Secondary | ICD-10-CM | POA: Diagnosis not present

## 2018-05-01 DIAGNOSIS — M6281 Muscle weakness (generalized): Secondary | ICD-10-CM | POA: Diagnosis not present

## 2018-05-01 DIAGNOSIS — G300 Alzheimer's disease with early onset: Secondary | ICD-10-CM | POA: Diagnosis not present

## 2018-05-02 DIAGNOSIS — G300 Alzheimer's disease with early onset: Secondary | ICD-10-CM | POA: Diagnosis not present

## 2018-05-02 DIAGNOSIS — G20 Parkinson's disease: Secondary | ICD-10-CM | POA: Diagnosis not present

## 2018-05-02 DIAGNOSIS — F0280 Dementia in other diseases classified elsewhere without behavioral disturbance: Secondary | ICD-10-CM | POA: Diagnosis not present

## 2018-05-02 DIAGNOSIS — M6281 Muscle weakness (generalized): Secondary | ICD-10-CM | POA: Diagnosis not present

## 2018-05-02 DIAGNOSIS — F319 Bipolar disorder, unspecified: Secondary | ICD-10-CM | POA: Diagnosis not present

## 2018-05-02 DIAGNOSIS — R2689 Other abnormalities of gait and mobility: Secondary | ICD-10-CM | POA: Diagnosis not present

## 2018-05-02 DIAGNOSIS — R41841 Cognitive communication deficit: Secondary | ICD-10-CM | POA: Diagnosis not present

## 2018-05-02 DIAGNOSIS — I4819 Other persistent atrial fibrillation: Secondary | ICD-10-CM | POA: Diagnosis not present

## 2018-05-03 DIAGNOSIS — R41841 Cognitive communication deficit: Secondary | ICD-10-CM | POA: Diagnosis not present

## 2018-05-03 DIAGNOSIS — R2689 Other abnormalities of gait and mobility: Secondary | ICD-10-CM | POA: Diagnosis not present

## 2018-05-03 DIAGNOSIS — F0280 Dementia in other diseases classified elsewhere without behavioral disturbance: Secondary | ICD-10-CM | POA: Diagnosis not present

## 2018-05-03 DIAGNOSIS — F319 Bipolar disorder, unspecified: Secondary | ICD-10-CM | POA: Diagnosis not present

## 2018-05-03 DIAGNOSIS — G300 Alzheimer's disease with early onset: Secondary | ICD-10-CM | POA: Diagnosis not present

## 2018-05-03 DIAGNOSIS — M6281 Muscle weakness (generalized): Secondary | ICD-10-CM | POA: Diagnosis not present

## 2018-05-03 DIAGNOSIS — I4819 Other persistent atrial fibrillation: Secondary | ICD-10-CM | POA: Diagnosis not present

## 2018-05-03 DIAGNOSIS — G20 Parkinson's disease: Secondary | ICD-10-CM | POA: Diagnosis not present

## 2018-05-04 DIAGNOSIS — R41841 Cognitive communication deficit: Secondary | ICD-10-CM | POA: Diagnosis not present

## 2018-05-04 DIAGNOSIS — G20 Parkinson's disease: Secondary | ICD-10-CM | POA: Diagnosis not present

## 2018-05-04 DIAGNOSIS — R2689 Other abnormalities of gait and mobility: Secondary | ICD-10-CM | POA: Diagnosis not present

## 2018-05-04 DIAGNOSIS — M6281 Muscle weakness (generalized): Secondary | ICD-10-CM | POA: Diagnosis not present

## 2018-05-04 DIAGNOSIS — F319 Bipolar disorder, unspecified: Secondary | ICD-10-CM | POA: Diagnosis not present

## 2018-05-04 DIAGNOSIS — I4819 Other persistent atrial fibrillation: Secondary | ICD-10-CM | POA: Diagnosis not present

## 2018-05-04 DIAGNOSIS — F0280 Dementia in other diseases classified elsewhere without behavioral disturbance: Secondary | ICD-10-CM | POA: Diagnosis not present

## 2018-05-04 DIAGNOSIS — G300 Alzheimer's disease with early onset: Secondary | ICD-10-CM | POA: Diagnosis not present

## 2018-05-05 DIAGNOSIS — G300 Alzheimer's disease with early onset: Secondary | ICD-10-CM | POA: Diagnosis not present

## 2018-05-05 DIAGNOSIS — R2689 Other abnormalities of gait and mobility: Secondary | ICD-10-CM | POA: Diagnosis not present

## 2018-05-05 DIAGNOSIS — F319 Bipolar disorder, unspecified: Secondary | ICD-10-CM | POA: Diagnosis not present

## 2018-05-05 DIAGNOSIS — I4819 Other persistent atrial fibrillation: Secondary | ICD-10-CM | POA: Diagnosis not present

## 2018-05-05 DIAGNOSIS — G20 Parkinson's disease: Secondary | ICD-10-CM | POA: Diagnosis not present

## 2018-05-05 DIAGNOSIS — M6281 Muscle weakness (generalized): Secondary | ICD-10-CM | POA: Diagnosis not present

## 2018-05-05 DIAGNOSIS — F0280 Dementia in other diseases classified elsewhere without behavioral disturbance: Secondary | ICD-10-CM | POA: Diagnosis not present

## 2018-05-05 DIAGNOSIS — R41841 Cognitive communication deficit: Secondary | ICD-10-CM | POA: Diagnosis not present

## 2018-05-06 DIAGNOSIS — G20 Parkinson's disease: Secondary | ICD-10-CM | POA: Diagnosis not present

## 2018-05-06 DIAGNOSIS — F319 Bipolar disorder, unspecified: Secondary | ICD-10-CM | POA: Diagnosis not present

## 2018-05-06 DIAGNOSIS — M6281 Muscle weakness (generalized): Secondary | ICD-10-CM | POA: Diagnosis not present

## 2018-05-06 DIAGNOSIS — G300 Alzheimer's disease with early onset: Secondary | ICD-10-CM | POA: Diagnosis not present

## 2018-05-06 DIAGNOSIS — I4819 Other persistent atrial fibrillation: Secondary | ICD-10-CM | POA: Diagnosis not present

## 2018-05-06 DIAGNOSIS — R41841 Cognitive communication deficit: Secondary | ICD-10-CM | POA: Diagnosis not present

## 2018-05-06 DIAGNOSIS — R2689 Other abnormalities of gait and mobility: Secondary | ICD-10-CM | POA: Diagnosis not present

## 2018-05-06 DIAGNOSIS — F0280 Dementia in other diseases classified elsewhere without behavioral disturbance: Secondary | ICD-10-CM | POA: Diagnosis not present

## 2018-05-07 DIAGNOSIS — F319 Bipolar disorder, unspecified: Secondary | ICD-10-CM | POA: Diagnosis not present

## 2018-05-07 DIAGNOSIS — G20 Parkinson's disease: Secondary | ICD-10-CM | POA: Diagnosis not present

## 2018-05-07 DIAGNOSIS — I4819 Other persistent atrial fibrillation: Secondary | ICD-10-CM | POA: Diagnosis not present

## 2018-05-07 DIAGNOSIS — M6281 Muscle weakness (generalized): Secondary | ICD-10-CM | POA: Diagnosis not present

## 2018-05-07 DIAGNOSIS — R2689 Other abnormalities of gait and mobility: Secondary | ICD-10-CM | POA: Diagnosis not present

## 2018-05-07 DIAGNOSIS — F329 Major depressive disorder, single episode, unspecified: Secondary | ICD-10-CM | POA: Diagnosis not present

## 2018-05-07 DIAGNOSIS — R41841 Cognitive communication deficit: Secondary | ICD-10-CM | POA: Diagnosis not present

## 2018-05-07 DIAGNOSIS — F0280 Dementia in other diseases classified elsewhere without behavioral disturbance: Secondary | ICD-10-CM | POA: Diagnosis not present

## 2018-05-07 DIAGNOSIS — G300 Alzheimer's disease with early onset: Secondary | ICD-10-CM | POA: Diagnosis not present

## 2018-05-07 DIAGNOSIS — F419 Anxiety disorder, unspecified: Secondary | ICD-10-CM | POA: Diagnosis not present

## 2018-05-08 DIAGNOSIS — G20 Parkinson's disease: Secondary | ICD-10-CM | POA: Diagnosis not present

## 2018-05-08 DIAGNOSIS — R2689 Other abnormalities of gait and mobility: Secondary | ICD-10-CM | POA: Diagnosis not present

## 2018-05-08 DIAGNOSIS — F0280 Dementia in other diseases classified elsewhere without behavioral disturbance: Secondary | ICD-10-CM | POA: Diagnosis not present

## 2018-05-08 DIAGNOSIS — G300 Alzheimer's disease with early onset: Secondary | ICD-10-CM | POA: Diagnosis not present

## 2018-05-08 DIAGNOSIS — I4819 Other persistent atrial fibrillation: Secondary | ICD-10-CM | POA: Diagnosis not present

## 2018-05-08 DIAGNOSIS — M6281 Muscle weakness (generalized): Secondary | ICD-10-CM | POA: Diagnosis not present

## 2018-05-08 DIAGNOSIS — R41841 Cognitive communication deficit: Secondary | ICD-10-CM | POA: Diagnosis not present

## 2018-05-08 DIAGNOSIS — F319 Bipolar disorder, unspecified: Secondary | ICD-10-CM | POA: Diagnosis not present

## 2018-05-09 DIAGNOSIS — G300 Alzheimer's disease with early onset: Secondary | ICD-10-CM | POA: Diagnosis not present

## 2018-05-09 DIAGNOSIS — I4819 Other persistent atrial fibrillation: Secondary | ICD-10-CM | POA: Diagnosis not present

## 2018-05-09 DIAGNOSIS — F319 Bipolar disorder, unspecified: Secondary | ICD-10-CM | POA: Diagnosis not present

## 2018-05-09 DIAGNOSIS — M6281 Muscle weakness (generalized): Secondary | ICD-10-CM | POA: Diagnosis not present

## 2018-05-09 DIAGNOSIS — R41841 Cognitive communication deficit: Secondary | ICD-10-CM | POA: Diagnosis not present

## 2018-05-09 DIAGNOSIS — R2689 Other abnormalities of gait and mobility: Secondary | ICD-10-CM | POA: Diagnosis not present

## 2018-05-09 DIAGNOSIS — R5381 Other malaise: Secondary | ICD-10-CM | POA: Diagnosis not present

## 2018-05-09 DIAGNOSIS — F419 Anxiety disorder, unspecified: Secondary | ICD-10-CM | POA: Diagnosis not present

## 2018-05-09 DIAGNOSIS — F331 Major depressive disorder, recurrent, moderate: Secondary | ICD-10-CM | POA: Diagnosis not present

## 2018-05-09 DIAGNOSIS — G20 Parkinson's disease: Secondary | ICD-10-CM | POA: Diagnosis not present

## 2018-05-09 DIAGNOSIS — F0280 Dementia in other diseases classified elsewhere without behavioral disturbance: Secondary | ICD-10-CM | POA: Diagnosis not present

## 2018-05-10 DIAGNOSIS — F319 Bipolar disorder, unspecified: Secondary | ICD-10-CM | POA: Diagnosis not present

## 2018-05-10 DIAGNOSIS — I4819 Other persistent atrial fibrillation: Secondary | ICD-10-CM | POA: Diagnosis not present

## 2018-05-10 DIAGNOSIS — G20 Parkinson's disease: Secondary | ICD-10-CM | POA: Diagnosis not present

## 2018-05-10 DIAGNOSIS — R41841 Cognitive communication deficit: Secondary | ICD-10-CM | POA: Diagnosis not present

## 2018-05-10 DIAGNOSIS — G300 Alzheimer's disease with early onset: Secondary | ICD-10-CM | POA: Diagnosis not present

## 2018-05-10 DIAGNOSIS — M6281 Muscle weakness (generalized): Secondary | ICD-10-CM | POA: Diagnosis not present

## 2018-05-10 DIAGNOSIS — R2689 Other abnormalities of gait and mobility: Secondary | ICD-10-CM | POA: Diagnosis not present

## 2018-05-10 DIAGNOSIS — F0280 Dementia in other diseases classified elsewhere without behavioral disturbance: Secondary | ICD-10-CM | POA: Diagnosis not present

## 2018-05-11 DIAGNOSIS — M6281 Muscle weakness (generalized): Secondary | ICD-10-CM | POA: Diagnosis not present

## 2018-05-11 DIAGNOSIS — R2689 Other abnormalities of gait and mobility: Secondary | ICD-10-CM | POA: Diagnosis not present

## 2018-05-11 DIAGNOSIS — R41841 Cognitive communication deficit: Secondary | ICD-10-CM | POA: Diagnosis not present

## 2018-05-11 DIAGNOSIS — G20 Parkinson's disease: Secondary | ICD-10-CM | POA: Diagnosis not present

## 2018-05-11 DIAGNOSIS — I4819 Other persistent atrial fibrillation: Secondary | ICD-10-CM | POA: Diagnosis not present

## 2018-05-11 DIAGNOSIS — G300 Alzheimer's disease with early onset: Secondary | ICD-10-CM | POA: Diagnosis not present

## 2018-05-11 DIAGNOSIS — F0280 Dementia in other diseases classified elsewhere without behavioral disturbance: Secondary | ICD-10-CM | POA: Diagnosis not present

## 2018-05-11 DIAGNOSIS — F319 Bipolar disorder, unspecified: Secondary | ICD-10-CM | POA: Diagnosis not present

## 2018-05-13 DIAGNOSIS — R41841 Cognitive communication deficit: Secondary | ICD-10-CM | POA: Diagnosis not present

## 2018-05-13 DIAGNOSIS — M6281 Muscle weakness (generalized): Secondary | ICD-10-CM | POA: Diagnosis not present

## 2018-05-13 DIAGNOSIS — F319 Bipolar disorder, unspecified: Secondary | ICD-10-CM | POA: Diagnosis not present

## 2018-05-13 DIAGNOSIS — G300 Alzheimer's disease with early onset: Secondary | ICD-10-CM | POA: Diagnosis not present

## 2018-05-13 DIAGNOSIS — R2689 Other abnormalities of gait and mobility: Secondary | ICD-10-CM | POA: Diagnosis not present

## 2018-05-13 DIAGNOSIS — G20 Parkinson's disease: Secondary | ICD-10-CM | POA: Diagnosis not present

## 2018-05-13 DIAGNOSIS — F0280 Dementia in other diseases classified elsewhere without behavioral disturbance: Secondary | ICD-10-CM | POA: Diagnosis not present

## 2018-05-13 DIAGNOSIS — I4819 Other persistent atrial fibrillation: Secondary | ICD-10-CM | POA: Diagnosis not present

## 2018-05-15 DIAGNOSIS — F329 Major depressive disorder, single episode, unspecified: Secondary | ICD-10-CM | POA: Diagnosis not present

## 2018-05-15 DIAGNOSIS — F419 Anxiety disorder, unspecified: Secondary | ICD-10-CM | POA: Diagnosis not present

## 2018-05-15 DIAGNOSIS — F319 Bipolar disorder, unspecified: Secondary | ICD-10-CM | POA: Diagnosis not present

## 2018-05-17 DIAGNOSIS — I4819 Other persistent atrial fibrillation: Secondary | ICD-10-CM | POA: Diagnosis not present

## 2018-05-17 DIAGNOSIS — G300 Alzheimer's disease with early onset: Secondary | ICD-10-CM | POA: Diagnosis not present

## 2018-05-17 DIAGNOSIS — R41841 Cognitive communication deficit: Secondary | ICD-10-CM | POA: Diagnosis not present

## 2018-05-17 DIAGNOSIS — F0280 Dementia in other diseases classified elsewhere without behavioral disturbance: Secondary | ICD-10-CM | POA: Diagnosis not present

## 2018-05-17 DIAGNOSIS — G20 Parkinson's disease: Secondary | ICD-10-CM | POA: Diagnosis not present

## 2018-05-17 DIAGNOSIS — R2689 Other abnormalities of gait and mobility: Secondary | ICD-10-CM | POA: Diagnosis not present

## 2018-05-17 DIAGNOSIS — F319 Bipolar disorder, unspecified: Secondary | ICD-10-CM | POA: Diagnosis not present

## 2018-05-17 DIAGNOSIS — M6281 Muscle weakness (generalized): Secondary | ICD-10-CM | POA: Diagnosis not present

## 2018-05-20 DIAGNOSIS — F0280 Dementia in other diseases classified elsewhere without behavioral disturbance: Secondary | ICD-10-CM | POA: Diagnosis not present

## 2018-05-20 DIAGNOSIS — R2689 Other abnormalities of gait and mobility: Secondary | ICD-10-CM | POA: Diagnosis not present

## 2018-05-20 DIAGNOSIS — F319 Bipolar disorder, unspecified: Secondary | ICD-10-CM | POA: Diagnosis not present

## 2018-05-20 DIAGNOSIS — I4819 Other persistent atrial fibrillation: Secondary | ICD-10-CM | POA: Diagnosis not present

## 2018-05-20 DIAGNOSIS — M6281 Muscle weakness (generalized): Secondary | ICD-10-CM | POA: Diagnosis not present

## 2018-05-20 DIAGNOSIS — R41841 Cognitive communication deficit: Secondary | ICD-10-CM | POA: Diagnosis not present

## 2018-05-20 DIAGNOSIS — G20 Parkinson's disease: Secondary | ICD-10-CM | POA: Diagnosis not present

## 2018-05-20 DIAGNOSIS — G300 Alzheimer's disease with early onset: Secondary | ICD-10-CM | POA: Diagnosis not present

## 2018-05-21 DIAGNOSIS — F331 Major depressive disorder, recurrent, moderate: Secondary | ICD-10-CM | POA: Diagnosis not present

## 2018-05-21 DIAGNOSIS — F0280 Dementia in other diseases classified elsewhere without behavioral disturbance: Secondary | ICD-10-CM | POA: Diagnosis not present

## 2018-05-21 DIAGNOSIS — R2689 Other abnormalities of gait and mobility: Secondary | ICD-10-CM | POA: Diagnosis not present

## 2018-05-21 DIAGNOSIS — F319 Bipolar disorder, unspecified: Secondary | ICD-10-CM | POA: Diagnosis not present

## 2018-05-21 DIAGNOSIS — G20 Parkinson's disease: Secondary | ICD-10-CM | POA: Diagnosis not present

## 2018-05-21 DIAGNOSIS — M6281 Muscle weakness (generalized): Secondary | ICD-10-CM | POA: Diagnosis not present

## 2018-05-21 DIAGNOSIS — F419 Anxiety disorder, unspecified: Secondary | ICD-10-CM | POA: Diagnosis not present

## 2018-05-21 DIAGNOSIS — I4819 Other persistent atrial fibrillation: Secondary | ICD-10-CM | POA: Diagnosis not present

## 2018-05-21 DIAGNOSIS — G300 Alzheimer's disease with early onset: Secondary | ICD-10-CM | POA: Diagnosis not present

## 2018-05-21 DIAGNOSIS — N3281 Overactive bladder: Secondary | ICD-10-CM | POA: Diagnosis not present

## 2018-05-21 DIAGNOSIS — R41841 Cognitive communication deficit: Secondary | ICD-10-CM | POA: Diagnosis not present

## 2018-05-21 DIAGNOSIS — F329 Major depressive disorder, single episode, unspecified: Secondary | ICD-10-CM | POA: Diagnosis not present

## 2018-05-22 DIAGNOSIS — I4819 Other persistent atrial fibrillation: Secondary | ICD-10-CM | POA: Diagnosis not present

## 2018-05-22 DIAGNOSIS — R2689 Other abnormalities of gait and mobility: Secondary | ICD-10-CM | POA: Diagnosis not present

## 2018-05-22 DIAGNOSIS — G20 Parkinson's disease: Secondary | ICD-10-CM | POA: Diagnosis not present

## 2018-05-22 DIAGNOSIS — G300 Alzheimer's disease with early onset: Secondary | ICD-10-CM | POA: Diagnosis not present

## 2018-05-22 DIAGNOSIS — F319 Bipolar disorder, unspecified: Secondary | ICD-10-CM | POA: Diagnosis not present

## 2018-05-22 DIAGNOSIS — M6281 Muscle weakness (generalized): Secondary | ICD-10-CM | POA: Diagnosis not present

## 2018-05-22 DIAGNOSIS — F0280 Dementia in other diseases classified elsewhere without behavioral disturbance: Secondary | ICD-10-CM | POA: Diagnosis not present

## 2018-05-22 DIAGNOSIS — R41841 Cognitive communication deficit: Secondary | ICD-10-CM | POA: Diagnosis not present

## 2018-05-23 DIAGNOSIS — R41841 Cognitive communication deficit: Secondary | ICD-10-CM | POA: Diagnosis not present

## 2018-05-23 DIAGNOSIS — R2689 Other abnormalities of gait and mobility: Secondary | ICD-10-CM | POA: Diagnosis not present

## 2018-05-23 DIAGNOSIS — G300 Alzheimer's disease with early onset: Secondary | ICD-10-CM | POA: Diagnosis not present

## 2018-05-23 DIAGNOSIS — F319 Bipolar disorder, unspecified: Secondary | ICD-10-CM | POA: Diagnosis not present

## 2018-05-23 DIAGNOSIS — M6281 Muscle weakness (generalized): Secondary | ICD-10-CM | POA: Diagnosis not present

## 2018-05-23 DIAGNOSIS — F0280 Dementia in other diseases classified elsewhere without behavioral disturbance: Secondary | ICD-10-CM | POA: Diagnosis not present

## 2018-05-23 DIAGNOSIS — I4819 Other persistent atrial fibrillation: Secondary | ICD-10-CM | POA: Diagnosis not present

## 2018-05-23 DIAGNOSIS — G20 Parkinson's disease: Secondary | ICD-10-CM | POA: Diagnosis not present

## 2018-05-24 DIAGNOSIS — G300 Alzheimer's disease with early onset: Secondary | ICD-10-CM | POA: Diagnosis not present

## 2018-05-24 DIAGNOSIS — R41841 Cognitive communication deficit: Secondary | ICD-10-CM | POA: Diagnosis not present

## 2018-05-24 DIAGNOSIS — R2689 Other abnormalities of gait and mobility: Secondary | ICD-10-CM | POA: Diagnosis not present

## 2018-05-24 DIAGNOSIS — F0280 Dementia in other diseases classified elsewhere without behavioral disturbance: Secondary | ICD-10-CM | POA: Diagnosis not present

## 2018-05-24 DIAGNOSIS — F319 Bipolar disorder, unspecified: Secondary | ICD-10-CM | POA: Diagnosis not present

## 2018-05-24 DIAGNOSIS — M6281 Muscle weakness (generalized): Secondary | ICD-10-CM | POA: Diagnosis not present

## 2018-05-24 DIAGNOSIS — G20 Parkinson's disease: Secondary | ICD-10-CM | POA: Diagnosis not present

## 2018-05-24 DIAGNOSIS — I4819 Other persistent atrial fibrillation: Secondary | ICD-10-CM | POA: Diagnosis not present

## 2018-05-25 DIAGNOSIS — F0280 Dementia in other diseases classified elsewhere without behavioral disturbance: Secondary | ICD-10-CM | POA: Diagnosis not present

## 2018-05-25 DIAGNOSIS — M6281 Muscle weakness (generalized): Secondary | ICD-10-CM | POA: Diagnosis not present

## 2018-05-25 DIAGNOSIS — I4819 Other persistent atrial fibrillation: Secondary | ICD-10-CM | POA: Diagnosis not present

## 2018-05-25 DIAGNOSIS — F319 Bipolar disorder, unspecified: Secondary | ICD-10-CM | POA: Diagnosis not present

## 2018-05-25 DIAGNOSIS — G20 Parkinson's disease: Secondary | ICD-10-CM | POA: Diagnosis not present

## 2018-05-25 DIAGNOSIS — R41841 Cognitive communication deficit: Secondary | ICD-10-CM | POA: Diagnosis not present

## 2018-05-25 DIAGNOSIS — R2689 Other abnormalities of gait and mobility: Secondary | ICD-10-CM | POA: Diagnosis not present

## 2018-05-25 DIAGNOSIS — G300 Alzheimer's disease with early onset: Secondary | ICD-10-CM | POA: Diagnosis not present

## 2018-05-27 DIAGNOSIS — G20 Parkinson's disease: Secondary | ICD-10-CM | POA: Diagnosis not present

## 2018-05-27 DIAGNOSIS — R2689 Other abnormalities of gait and mobility: Secondary | ICD-10-CM | POA: Diagnosis not present

## 2018-05-27 DIAGNOSIS — G300 Alzheimer's disease with early onset: Secondary | ICD-10-CM | POA: Diagnosis not present

## 2018-05-27 DIAGNOSIS — F0280 Dementia in other diseases classified elsewhere without behavioral disturbance: Secondary | ICD-10-CM | POA: Diagnosis not present

## 2018-05-27 DIAGNOSIS — I4819 Other persistent atrial fibrillation: Secondary | ICD-10-CM | POA: Diagnosis not present

## 2018-05-27 DIAGNOSIS — F319 Bipolar disorder, unspecified: Secondary | ICD-10-CM | POA: Diagnosis not present

## 2018-05-27 DIAGNOSIS — R41841 Cognitive communication deficit: Secondary | ICD-10-CM | POA: Diagnosis not present

## 2018-05-27 DIAGNOSIS — M6281 Muscle weakness (generalized): Secondary | ICD-10-CM | POA: Diagnosis not present

## 2018-05-28 DIAGNOSIS — R2689 Other abnormalities of gait and mobility: Secondary | ICD-10-CM | POA: Diagnosis not present

## 2018-05-28 DIAGNOSIS — G20 Parkinson's disease: Secondary | ICD-10-CM | POA: Diagnosis not present

## 2018-05-28 DIAGNOSIS — F0280 Dementia in other diseases classified elsewhere without behavioral disturbance: Secondary | ICD-10-CM | POA: Diagnosis not present

## 2018-05-28 DIAGNOSIS — F319 Bipolar disorder, unspecified: Secondary | ICD-10-CM | POA: Diagnosis not present

## 2018-05-28 DIAGNOSIS — I4819 Other persistent atrial fibrillation: Secondary | ICD-10-CM | POA: Diagnosis not present

## 2018-05-28 DIAGNOSIS — M6281 Muscle weakness (generalized): Secondary | ICD-10-CM | POA: Diagnosis not present

## 2018-05-28 DIAGNOSIS — G300 Alzheimer's disease with early onset: Secondary | ICD-10-CM | POA: Diagnosis not present

## 2018-05-28 DIAGNOSIS — F329 Major depressive disorder, single episode, unspecified: Secondary | ICD-10-CM | POA: Diagnosis not present

## 2018-05-28 DIAGNOSIS — R41841 Cognitive communication deficit: Secondary | ICD-10-CM | POA: Diagnosis not present

## 2018-05-28 DIAGNOSIS — F419 Anxiety disorder, unspecified: Secondary | ICD-10-CM | POA: Diagnosis not present

## 2018-05-29 DIAGNOSIS — G20 Parkinson's disease: Secondary | ICD-10-CM | POA: Diagnosis not present

## 2018-05-29 DIAGNOSIS — R41841 Cognitive communication deficit: Secondary | ICD-10-CM | POA: Diagnosis not present

## 2018-05-29 DIAGNOSIS — F319 Bipolar disorder, unspecified: Secondary | ICD-10-CM | POA: Diagnosis not present

## 2018-05-29 DIAGNOSIS — G300 Alzheimer's disease with early onset: Secondary | ICD-10-CM | POA: Diagnosis not present

## 2018-05-29 DIAGNOSIS — R2689 Other abnormalities of gait and mobility: Secondary | ICD-10-CM | POA: Diagnosis not present

## 2018-05-29 DIAGNOSIS — F0280 Dementia in other diseases classified elsewhere without behavioral disturbance: Secondary | ICD-10-CM | POA: Diagnosis not present

## 2018-05-29 DIAGNOSIS — M6281 Muscle weakness (generalized): Secondary | ICD-10-CM | POA: Diagnosis not present

## 2018-05-29 DIAGNOSIS — I4819 Other persistent atrial fibrillation: Secondary | ICD-10-CM | POA: Diagnosis not present

## 2018-05-30 DIAGNOSIS — F0280 Dementia in other diseases classified elsewhere without behavioral disturbance: Secondary | ICD-10-CM | POA: Diagnosis not present

## 2018-05-30 DIAGNOSIS — M6281 Muscle weakness (generalized): Secondary | ICD-10-CM | POA: Diagnosis not present

## 2018-05-30 DIAGNOSIS — I4819 Other persistent atrial fibrillation: Secondary | ICD-10-CM | POA: Diagnosis not present

## 2018-05-30 DIAGNOSIS — R41841 Cognitive communication deficit: Secondary | ICD-10-CM | POA: Diagnosis not present

## 2018-05-30 DIAGNOSIS — G20 Parkinson's disease: Secondary | ICD-10-CM | POA: Diagnosis not present

## 2018-05-30 DIAGNOSIS — F319 Bipolar disorder, unspecified: Secondary | ICD-10-CM | POA: Diagnosis not present

## 2018-05-30 DIAGNOSIS — G300 Alzheimer's disease with early onset: Secondary | ICD-10-CM | POA: Diagnosis not present

## 2018-05-30 DIAGNOSIS — R2689 Other abnormalities of gait and mobility: Secondary | ICD-10-CM | POA: Diagnosis not present

## 2018-05-31 DIAGNOSIS — I4819 Other persistent atrial fibrillation: Secondary | ICD-10-CM | POA: Diagnosis not present

## 2018-05-31 DIAGNOSIS — M6281 Muscle weakness (generalized): Secondary | ICD-10-CM | POA: Diagnosis not present

## 2018-05-31 DIAGNOSIS — G300 Alzheimer's disease with early onset: Secondary | ICD-10-CM | POA: Diagnosis not present

## 2018-05-31 DIAGNOSIS — F0280 Dementia in other diseases classified elsewhere without behavioral disturbance: Secondary | ICD-10-CM | POA: Diagnosis not present

## 2018-05-31 DIAGNOSIS — R2689 Other abnormalities of gait and mobility: Secondary | ICD-10-CM | POA: Diagnosis not present

## 2018-05-31 DIAGNOSIS — G20 Parkinson's disease: Secondary | ICD-10-CM | POA: Diagnosis not present

## 2018-05-31 DIAGNOSIS — F319 Bipolar disorder, unspecified: Secondary | ICD-10-CM | POA: Diagnosis not present

## 2018-05-31 DIAGNOSIS — R41841 Cognitive communication deficit: Secondary | ICD-10-CM | POA: Diagnosis not present

## 2018-06-01 DIAGNOSIS — G20 Parkinson's disease: Secondary | ICD-10-CM | POA: Diagnosis not present

## 2018-06-01 DIAGNOSIS — F319 Bipolar disorder, unspecified: Secondary | ICD-10-CM | POA: Diagnosis not present

## 2018-06-01 DIAGNOSIS — F0280 Dementia in other diseases classified elsewhere without behavioral disturbance: Secondary | ICD-10-CM | POA: Diagnosis not present

## 2018-06-01 DIAGNOSIS — G300 Alzheimer's disease with early onset: Secondary | ICD-10-CM | POA: Diagnosis not present

## 2018-06-01 DIAGNOSIS — R2689 Other abnormalities of gait and mobility: Secondary | ICD-10-CM | POA: Diagnosis not present

## 2018-06-01 DIAGNOSIS — I4819 Other persistent atrial fibrillation: Secondary | ICD-10-CM | POA: Diagnosis not present

## 2018-06-01 DIAGNOSIS — R41841 Cognitive communication deficit: Secondary | ICD-10-CM | POA: Diagnosis not present

## 2018-06-01 DIAGNOSIS — M6281 Muscle weakness (generalized): Secondary | ICD-10-CM | POA: Diagnosis not present

## 2018-06-03 DIAGNOSIS — M6281 Muscle weakness (generalized): Secondary | ICD-10-CM | POA: Diagnosis not present

## 2018-06-03 DIAGNOSIS — G20 Parkinson's disease: Secondary | ICD-10-CM | POA: Diagnosis not present

## 2018-06-03 DIAGNOSIS — R2689 Other abnormalities of gait and mobility: Secondary | ICD-10-CM | POA: Diagnosis not present

## 2018-06-03 DIAGNOSIS — I4819 Other persistent atrial fibrillation: Secondary | ICD-10-CM | POA: Diagnosis not present

## 2018-06-03 DIAGNOSIS — G300 Alzheimer's disease with early onset: Secondary | ICD-10-CM | POA: Diagnosis not present

## 2018-06-03 DIAGNOSIS — R41841 Cognitive communication deficit: Secondary | ICD-10-CM | POA: Diagnosis not present

## 2018-06-03 DIAGNOSIS — F0280 Dementia in other diseases classified elsewhere without behavioral disturbance: Secondary | ICD-10-CM | POA: Diagnosis not present

## 2018-06-03 DIAGNOSIS — F319 Bipolar disorder, unspecified: Secondary | ICD-10-CM | POA: Diagnosis not present

## 2018-06-04 DIAGNOSIS — F329 Major depressive disorder, single episode, unspecified: Secondary | ICD-10-CM | POA: Diagnosis not present

## 2018-06-04 DIAGNOSIS — I4819 Other persistent atrial fibrillation: Secondary | ICD-10-CM | POA: Diagnosis not present

## 2018-06-04 DIAGNOSIS — M6281 Muscle weakness (generalized): Secondary | ICD-10-CM | POA: Diagnosis not present

## 2018-06-04 DIAGNOSIS — R2689 Other abnormalities of gait and mobility: Secondary | ICD-10-CM | POA: Diagnosis not present

## 2018-06-04 DIAGNOSIS — R41841 Cognitive communication deficit: Secondary | ICD-10-CM | POA: Diagnosis not present

## 2018-06-04 DIAGNOSIS — F0280 Dementia in other diseases classified elsewhere without behavioral disturbance: Secondary | ICD-10-CM | POA: Diagnosis not present

## 2018-06-04 DIAGNOSIS — G300 Alzheimer's disease with early onset: Secondary | ICD-10-CM | POA: Diagnosis not present

## 2018-06-04 DIAGNOSIS — F319 Bipolar disorder, unspecified: Secondary | ICD-10-CM | POA: Diagnosis not present

## 2018-06-04 DIAGNOSIS — G20 Parkinson's disease: Secondary | ICD-10-CM | POA: Diagnosis not present

## 2018-06-04 DIAGNOSIS — F419 Anxiety disorder, unspecified: Secondary | ICD-10-CM | POA: Diagnosis not present

## 2018-06-05 DIAGNOSIS — I4819 Other persistent atrial fibrillation: Secondary | ICD-10-CM | POA: Diagnosis not present

## 2018-06-05 DIAGNOSIS — F319 Bipolar disorder, unspecified: Secondary | ICD-10-CM | POA: Diagnosis not present

## 2018-06-05 DIAGNOSIS — G300 Alzheimer's disease with early onset: Secondary | ICD-10-CM | POA: Diagnosis not present

## 2018-06-05 DIAGNOSIS — M6281 Muscle weakness (generalized): Secondary | ICD-10-CM | POA: Diagnosis not present

## 2018-06-05 DIAGNOSIS — F0280 Dementia in other diseases classified elsewhere without behavioral disturbance: Secondary | ICD-10-CM | POA: Diagnosis not present

## 2018-06-05 DIAGNOSIS — G20 Parkinson's disease: Secondary | ICD-10-CM | POA: Diagnosis not present

## 2018-06-05 DIAGNOSIS — R2689 Other abnormalities of gait and mobility: Secondary | ICD-10-CM | POA: Diagnosis not present

## 2018-06-05 DIAGNOSIS — R41841 Cognitive communication deficit: Secondary | ICD-10-CM | POA: Diagnosis not present

## 2018-06-06 DIAGNOSIS — F0280 Dementia in other diseases classified elsewhere without behavioral disturbance: Secondary | ICD-10-CM | POA: Diagnosis not present

## 2018-06-06 DIAGNOSIS — G300 Alzheimer's disease with early onset: Secondary | ICD-10-CM | POA: Diagnosis not present

## 2018-06-06 DIAGNOSIS — R41841 Cognitive communication deficit: Secondary | ICD-10-CM | POA: Diagnosis not present

## 2018-06-06 DIAGNOSIS — R2689 Other abnormalities of gait and mobility: Secondary | ICD-10-CM | POA: Diagnosis not present

## 2018-06-06 DIAGNOSIS — I4819 Other persistent atrial fibrillation: Secondary | ICD-10-CM | POA: Diagnosis not present

## 2018-06-06 DIAGNOSIS — G20 Parkinson's disease: Secondary | ICD-10-CM | POA: Diagnosis not present

## 2018-06-06 DIAGNOSIS — F319 Bipolar disorder, unspecified: Secondary | ICD-10-CM | POA: Diagnosis not present

## 2018-06-06 DIAGNOSIS — M6281 Muscle weakness (generalized): Secondary | ICD-10-CM | POA: Diagnosis not present

## 2018-06-07 DIAGNOSIS — I4819 Other persistent atrial fibrillation: Secondary | ICD-10-CM | POA: Diagnosis not present

## 2018-06-07 DIAGNOSIS — G300 Alzheimer's disease with early onset: Secondary | ICD-10-CM | POA: Diagnosis not present

## 2018-06-07 DIAGNOSIS — G20 Parkinson's disease: Secondary | ICD-10-CM | POA: Diagnosis not present

## 2018-06-07 DIAGNOSIS — F0280 Dementia in other diseases classified elsewhere without behavioral disturbance: Secondary | ICD-10-CM | POA: Diagnosis not present

## 2018-06-07 DIAGNOSIS — M6281 Muscle weakness (generalized): Secondary | ICD-10-CM | POA: Diagnosis not present

## 2018-06-07 DIAGNOSIS — F319 Bipolar disorder, unspecified: Secondary | ICD-10-CM | POA: Diagnosis not present

## 2018-06-07 DIAGNOSIS — R41841 Cognitive communication deficit: Secondary | ICD-10-CM | POA: Diagnosis not present

## 2018-06-07 DIAGNOSIS — R2689 Other abnormalities of gait and mobility: Secondary | ICD-10-CM | POA: Diagnosis not present

## 2018-06-10 DIAGNOSIS — R2689 Other abnormalities of gait and mobility: Secondary | ICD-10-CM | POA: Diagnosis not present

## 2018-06-10 DIAGNOSIS — G300 Alzheimer's disease with early onset: Secondary | ICD-10-CM | POA: Diagnosis not present

## 2018-06-10 DIAGNOSIS — M6281 Muscle weakness (generalized): Secondary | ICD-10-CM | POA: Diagnosis not present

## 2018-06-10 DIAGNOSIS — R41841 Cognitive communication deficit: Secondary | ICD-10-CM | POA: Diagnosis not present

## 2018-06-10 DIAGNOSIS — F319 Bipolar disorder, unspecified: Secondary | ICD-10-CM | POA: Diagnosis not present

## 2018-06-10 DIAGNOSIS — F0280 Dementia in other diseases classified elsewhere without behavioral disturbance: Secondary | ICD-10-CM | POA: Diagnosis not present

## 2018-06-10 DIAGNOSIS — I4819 Other persistent atrial fibrillation: Secondary | ICD-10-CM | POA: Diagnosis not present

## 2018-06-10 DIAGNOSIS — G20 Parkinson's disease: Secondary | ICD-10-CM | POA: Diagnosis not present

## 2018-06-11 DIAGNOSIS — G300 Alzheimer's disease with early onset: Secondary | ICD-10-CM | POA: Diagnosis not present

## 2018-06-11 DIAGNOSIS — R2689 Other abnormalities of gait and mobility: Secondary | ICD-10-CM | POA: Diagnosis not present

## 2018-06-11 DIAGNOSIS — F319 Bipolar disorder, unspecified: Secondary | ICD-10-CM | POA: Diagnosis not present

## 2018-06-11 DIAGNOSIS — F0280 Dementia in other diseases classified elsewhere without behavioral disturbance: Secondary | ICD-10-CM | POA: Diagnosis not present

## 2018-06-11 DIAGNOSIS — M6281 Muscle weakness (generalized): Secondary | ICD-10-CM | POA: Diagnosis not present

## 2018-06-11 DIAGNOSIS — I4819 Other persistent atrial fibrillation: Secondary | ICD-10-CM | POA: Diagnosis not present

## 2018-06-11 DIAGNOSIS — R41841 Cognitive communication deficit: Secondary | ICD-10-CM | POA: Diagnosis not present

## 2018-06-11 DIAGNOSIS — G20 Parkinson's disease: Secondary | ICD-10-CM | POA: Diagnosis not present

## 2018-06-12 DIAGNOSIS — F0280 Dementia in other diseases classified elsewhere without behavioral disturbance: Secondary | ICD-10-CM | POA: Diagnosis not present

## 2018-06-12 DIAGNOSIS — R41841 Cognitive communication deficit: Secondary | ICD-10-CM | POA: Diagnosis not present

## 2018-06-12 DIAGNOSIS — M6281 Muscle weakness (generalized): Secondary | ICD-10-CM | POA: Diagnosis not present

## 2018-06-12 DIAGNOSIS — I4819 Other persistent atrial fibrillation: Secondary | ICD-10-CM | POA: Diagnosis not present

## 2018-06-12 DIAGNOSIS — G20 Parkinson's disease: Secondary | ICD-10-CM | POA: Diagnosis not present

## 2018-06-12 DIAGNOSIS — F319 Bipolar disorder, unspecified: Secondary | ICD-10-CM | POA: Diagnosis not present

## 2018-06-12 DIAGNOSIS — G300 Alzheimer's disease with early onset: Secondary | ICD-10-CM | POA: Diagnosis not present

## 2018-06-12 DIAGNOSIS — R2689 Other abnormalities of gait and mobility: Secondary | ICD-10-CM | POA: Diagnosis not present

## 2018-06-13 DIAGNOSIS — G300 Alzheimer's disease with early onset: Secondary | ICD-10-CM | POA: Diagnosis not present

## 2018-06-13 DIAGNOSIS — R2689 Other abnormalities of gait and mobility: Secondary | ICD-10-CM | POA: Diagnosis not present

## 2018-06-13 DIAGNOSIS — F0280 Dementia in other diseases classified elsewhere without behavioral disturbance: Secondary | ICD-10-CM | POA: Diagnosis not present

## 2018-06-13 DIAGNOSIS — F331 Major depressive disorder, recurrent, moderate: Secondary | ICD-10-CM | POA: Diagnosis not present

## 2018-06-13 DIAGNOSIS — R41841 Cognitive communication deficit: Secondary | ICD-10-CM | POA: Diagnosis not present

## 2018-06-13 DIAGNOSIS — G20 Parkinson's disease: Secondary | ICD-10-CM | POA: Diagnosis not present

## 2018-06-13 DIAGNOSIS — I4819 Other persistent atrial fibrillation: Secondary | ICD-10-CM | POA: Diagnosis not present

## 2018-06-13 DIAGNOSIS — F319 Bipolar disorder, unspecified: Secondary | ICD-10-CM | POA: Diagnosis not present

## 2018-06-13 DIAGNOSIS — R5381 Other malaise: Secondary | ICD-10-CM | POA: Diagnosis not present

## 2018-06-13 DIAGNOSIS — F419 Anxiety disorder, unspecified: Secondary | ICD-10-CM | POA: Diagnosis not present

## 2018-06-13 DIAGNOSIS — M6281 Muscle weakness (generalized): Secondary | ICD-10-CM | POA: Diagnosis not present

## 2018-06-14 DIAGNOSIS — F0280 Dementia in other diseases classified elsewhere without behavioral disturbance: Secondary | ICD-10-CM | POA: Diagnosis not present

## 2018-06-14 DIAGNOSIS — R2689 Other abnormalities of gait and mobility: Secondary | ICD-10-CM | POA: Diagnosis not present

## 2018-06-14 DIAGNOSIS — G20 Parkinson's disease: Secondary | ICD-10-CM | POA: Diagnosis not present

## 2018-06-14 DIAGNOSIS — M6281 Muscle weakness (generalized): Secondary | ICD-10-CM | POA: Diagnosis not present

## 2018-06-14 DIAGNOSIS — R41841 Cognitive communication deficit: Secondary | ICD-10-CM | POA: Diagnosis not present

## 2018-06-14 DIAGNOSIS — I4819 Other persistent atrial fibrillation: Secondary | ICD-10-CM | POA: Diagnosis not present

## 2018-06-14 DIAGNOSIS — G300 Alzheimer's disease with early onset: Secondary | ICD-10-CM | POA: Diagnosis not present

## 2018-06-14 DIAGNOSIS — F319 Bipolar disorder, unspecified: Secondary | ICD-10-CM | POA: Diagnosis not present

## 2018-06-15 DIAGNOSIS — I4819 Other persistent atrial fibrillation: Secondary | ICD-10-CM | POA: Diagnosis not present

## 2018-06-15 DIAGNOSIS — R41841 Cognitive communication deficit: Secondary | ICD-10-CM | POA: Diagnosis not present

## 2018-06-15 DIAGNOSIS — G300 Alzheimer's disease with early onset: Secondary | ICD-10-CM | POA: Diagnosis not present

## 2018-06-15 DIAGNOSIS — G20 Parkinson's disease: Secondary | ICD-10-CM | POA: Diagnosis not present

## 2018-06-15 DIAGNOSIS — F0280 Dementia in other diseases classified elsewhere without behavioral disturbance: Secondary | ICD-10-CM | POA: Diagnosis not present

## 2018-06-15 DIAGNOSIS — R2689 Other abnormalities of gait and mobility: Secondary | ICD-10-CM | POA: Diagnosis not present

## 2018-06-15 DIAGNOSIS — F319 Bipolar disorder, unspecified: Secondary | ICD-10-CM | POA: Diagnosis not present

## 2018-06-15 DIAGNOSIS — M6281 Muscle weakness (generalized): Secondary | ICD-10-CM | POA: Diagnosis not present

## 2018-06-16 DIAGNOSIS — R2689 Other abnormalities of gait and mobility: Secondary | ICD-10-CM | POA: Diagnosis not present

## 2018-06-16 DIAGNOSIS — I4819 Other persistent atrial fibrillation: Secondary | ICD-10-CM | POA: Diagnosis not present

## 2018-06-16 DIAGNOSIS — R41841 Cognitive communication deficit: Secondary | ICD-10-CM | POA: Diagnosis not present

## 2018-06-16 DIAGNOSIS — G300 Alzheimer's disease with early onset: Secondary | ICD-10-CM | POA: Diagnosis not present

## 2018-06-16 DIAGNOSIS — F319 Bipolar disorder, unspecified: Secondary | ICD-10-CM | POA: Diagnosis not present

## 2018-06-16 DIAGNOSIS — G20 Parkinson's disease: Secondary | ICD-10-CM | POA: Diagnosis not present

## 2018-06-16 DIAGNOSIS — F0280 Dementia in other diseases classified elsewhere without behavioral disturbance: Secondary | ICD-10-CM | POA: Diagnosis not present

## 2018-06-16 DIAGNOSIS — M6281 Muscle weakness (generalized): Secondary | ICD-10-CM | POA: Diagnosis not present

## 2018-06-17 DIAGNOSIS — G20 Parkinson's disease: Secondary | ICD-10-CM | POA: Diagnosis not present

## 2018-06-17 DIAGNOSIS — I4819 Other persistent atrial fibrillation: Secondary | ICD-10-CM | POA: Diagnosis not present

## 2018-06-17 DIAGNOSIS — G300 Alzheimer's disease with early onset: Secondary | ICD-10-CM | POA: Diagnosis not present

## 2018-06-17 DIAGNOSIS — R41841 Cognitive communication deficit: Secondary | ICD-10-CM | POA: Diagnosis not present

## 2018-06-17 DIAGNOSIS — M6281 Muscle weakness (generalized): Secondary | ICD-10-CM | POA: Diagnosis not present

## 2018-06-17 DIAGNOSIS — F319 Bipolar disorder, unspecified: Secondary | ICD-10-CM | POA: Diagnosis not present

## 2018-06-17 DIAGNOSIS — F0280 Dementia in other diseases classified elsewhere without behavioral disturbance: Secondary | ICD-10-CM | POA: Diagnosis not present

## 2018-06-17 DIAGNOSIS — R2689 Other abnormalities of gait and mobility: Secondary | ICD-10-CM | POA: Diagnosis not present

## 2018-06-18 DIAGNOSIS — R5381 Other malaise: Secondary | ICD-10-CM | POA: Diagnosis not present

## 2018-06-18 DIAGNOSIS — F419 Anxiety disorder, unspecified: Secondary | ICD-10-CM | POA: Diagnosis not present

## 2018-06-18 DIAGNOSIS — G20 Parkinson's disease: Secondary | ICD-10-CM | POA: Diagnosis not present

## 2018-06-18 DIAGNOSIS — M6281 Muscle weakness (generalized): Secondary | ICD-10-CM | POA: Diagnosis not present

## 2018-06-18 DIAGNOSIS — G300 Alzheimer's disease with early onset: Secondary | ICD-10-CM | POA: Diagnosis not present

## 2018-06-18 DIAGNOSIS — R2689 Other abnormalities of gait and mobility: Secondary | ICD-10-CM | POA: Diagnosis not present

## 2018-06-18 DIAGNOSIS — R41841 Cognitive communication deficit: Secondary | ICD-10-CM | POA: Diagnosis not present

## 2018-06-18 DIAGNOSIS — I4819 Other persistent atrial fibrillation: Secondary | ICD-10-CM | POA: Diagnosis not present

## 2018-06-18 DIAGNOSIS — F319 Bipolar disorder, unspecified: Secondary | ICD-10-CM | POA: Diagnosis not present

## 2018-06-18 DIAGNOSIS — F331 Major depressive disorder, recurrent, moderate: Secondary | ICD-10-CM | POA: Diagnosis not present

## 2018-06-18 DIAGNOSIS — F0280 Dementia in other diseases classified elsewhere without behavioral disturbance: Secondary | ICD-10-CM | POA: Diagnosis not present

## 2018-06-19 DIAGNOSIS — R41841 Cognitive communication deficit: Secondary | ICD-10-CM | POA: Diagnosis not present

## 2018-06-19 DIAGNOSIS — F0280 Dementia in other diseases classified elsewhere without behavioral disturbance: Secondary | ICD-10-CM | POA: Diagnosis not present

## 2018-06-19 DIAGNOSIS — I4819 Other persistent atrial fibrillation: Secondary | ICD-10-CM | POA: Diagnosis not present

## 2018-06-19 DIAGNOSIS — R2689 Other abnormalities of gait and mobility: Secondary | ICD-10-CM | POA: Diagnosis not present

## 2018-06-19 DIAGNOSIS — F319 Bipolar disorder, unspecified: Secondary | ICD-10-CM | POA: Diagnosis not present

## 2018-06-19 DIAGNOSIS — M6281 Muscle weakness (generalized): Secondary | ICD-10-CM | POA: Diagnosis not present

## 2018-06-19 DIAGNOSIS — G300 Alzheimer's disease with early onset: Secondary | ICD-10-CM | POA: Diagnosis not present

## 2018-06-19 DIAGNOSIS — G20 Parkinson's disease: Secondary | ICD-10-CM | POA: Diagnosis not present

## 2018-06-20 DIAGNOSIS — F319 Bipolar disorder, unspecified: Secondary | ICD-10-CM | POA: Diagnosis not present

## 2018-06-20 DIAGNOSIS — R41841 Cognitive communication deficit: Secondary | ICD-10-CM | POA: Diagnosis not present

## 2018-06-20 DIAGNOSIS — M6281 Muscle weakness (generalized): Secondary | ICD-10-CM | POA: Diagnosis not present

## 2018-06-20 DIAGNOSIS — G20 Parkinson's disease: Secondary | ICD-10-CM | POA: Diagnosis not present

## 2018-06-20 DIAGNOSIS — I4819 Other persistent atrial fibrillation: Secondary | ICD-10-CM | POA: Diagnosis not present

## 2018-06-20 DIAGNOSIS — R2689 Other abnormalities of gait and mobility: Secondary | ICD-10-CM | POA: Diagnosis not present

## 2018-06-20 DIAGNOSIS — G300 Alzheimer's disease with early onset: Secondary | ICD-10-CM | POA: Diagnosis not present

## 2018-06-20 DIAGNOSIS — F0280 Dementia in other diseases classified elsewhere without behavioral disturbance: Secondary | ICD-10-CM | POA: Diagnosis not present

## 2018-06-21 DIAGNOSIS — G20 Parkinson's disease: Secondary | ICD-10-CM | POA: Diagnosis not present

## 2018-06-21 DIAGNOSIS — G300 Alzheimer's disease with early onset: Secondary | ICD-10-CM | POA: Diagnosis not present

## 2018-06-21 DIAGNOSIS — R41841 Cognitive communication deficit: Secondary | ICD-10-CM | POA: Diagnosis not present

## 2018-06-21 DIAGNOSIS — I4819 Other persistent atrial fibrillation: Secondary | ICD-10-CM | POA: Diagnosis not present

## 2018-06-21 DIAGNOSIS — F0280 Dementia in other diseases classified elsewhere without behavioral disturbance: Secondary | ICD-10-CM | POA: Diagnosis not present

## 2018-06-21 DIAGNOSIS — M6281 Muscle weakness (generalized): Secondary | ICD-10-CM | POA: Diagnosis not present

## 2018-06-21 DIAGNOSIS — R2689 Other abnormalities of gait and mobility: Secondary | ICD-10-CM | POA: Diagnosis not present

## 2018-06-21 DIAGNOSIS — F319 Bipolar disorder, unspecified: Secondary | ICD-10-CM | POA: Diagnosis not present

## 2018-06-22 DIAGNOSIS — I4819 Other persistent atrial fibrillation: Secondary | ICD-10-CM | POA: Diagnosis not present

## 2018-06-22 DIAGNOSIS — F319 Bipolar disorder, unspecified: Secondary | ICD-10-CM | POA: Diagnosis not present

## 2018-06-22 DIAGNOSIS — R2689 Other abnormalities of gait and mobility: Secondary | ICD-10-CM | POA: Diagnosis not present

## 2018-06-22 DIAGNOSIS — R41841 Cognitive communication deficit: Secondary | ICD-10-CM | POA: Diagnosis not present

## 2018-06-22 DIAGNOSIS — G20 Parkinson's disease: Secondary | ICD-10-CM | POA: Diagnosis not present

## 2018-06-22 DIAGNOSIS — F0280 Dementia in other diseases classified elsewhere without behavioral disturbance: Secondary | ICD-10-CM | POA: Diagnosis not present

## 2018-06-22 DIAGNOSIS — G300 Alzheimer's disease with early onset: Secondary | ICD-10-CM | POA: Diagnosis not present

## 2018-06-22 DIAGNOSIS — M6281 Muscle weakness (generalized): Secondary | ICD-10-CM | POA: Diagnosis not present

## 2018-06-24 DIAGNOSIS — F319 Bipolar disorder, unspecified: Secondary | ICD-10-CM | POA: Diagnosis not present

## 2018-06-24 DIAGNOSIS — R41841 Cognitive communication deficit: Secondary | ICD-10-CM | POA: Diagnosis not present

## 2018-06-24 DIAGNOSIS — R2689 Other abnormalities of gait and mobility: Secondary | ICD-10-CM | POA: Diagnosis not present

## 2018-06-24 DIAGNOSIS — M6281 Muscle weakness (generalized): Secondary | ICD-10-CM | POA: Diagnosis not present

## 2018-06-24 DIAGNOSIS — G20 Parkinson's disease: Secondary | ICD-10-CM | POA: Diagnosis not present

## 2018-06-24 DIAGNOSIS — I4819 Other persistent atrial fibrillation: Secondary | ICD-10-CM | POA: Diagnosis not present

## 2018-06-24 DIAGNOSIS — G300 Alzheimer's disease with early onset: Secondary | ICD-10-CM | POA: Diagnosis not present

## 2018-06-24 DIAGNOSIS — F0280 Dementia in other diseases classified elsewhere without behavioral disturbance: Secondary | ICD-10-CM | POA: Diagnosis not present

## 2018-06-25 DIAGNOSIS — G300 Alzheimer's disease with early onset: Secondary | ICD-10-CM | POA: Diagnosis not present

## 2018-06-25 DIAGNOSIS — F0280 Dementia in other diseases classified elsewhere without behavioral disturbance: Secondary | ICD-10-CM | POA: Diagnosis not present

## 2018-06-25 DIAGNOSIS — R41841 Cognitive communication deficit: Secondary | ICD-10-CM | POA: Diagnosis not present

## 2018-06-25 DIAGNOSIS — G20 Parkinson's disease: Secondary | ICD-10-CM | POA: Diagnosis not present

## 2018-06-25 DIAGNOSIS — F319 Bipolar disorder, unspecified: Secondary | ICD-10-CM | POA: Diagnosis not present

## 2018-06-25 DIAGNOSIS — R2689 Other abnormalities of gait and mobility: Secondary | ICD-10-CM | POA: Diagnosis not present

## 2018-06-25 DIAGNOSIS — M6281 Muscle weakness (generalized): Secondary | ICD-10-CM | POA: Diagnosis not present

## 2018-06-25 DIAGNOSIS — I4819 Other persistent atrial fibrillation: Secondary | ICD-10-CM | POA: Diagnosis not present

## 2018-06-26 DIAGNOSIS — F319 Bipolar disorder, unspecified: Secondary | ICD-10-CM | POA: Diagnosis not present

## 2018-06-26 DIAGNOSIS — G20 Parkinson's disease: Secondary | ICD-10-CM | POA: Diagnosis not present

## 2018-06-26 DIAGNOSIS — R41841 Cognitive communication deficit: Secondary | ICD-10-CM | POA: Diagnosis not present

## 2018-06-26 DIAGNOSIS — G300 Alzheimer's disease with early onset: Secondary | ICD-10-CM | POA: Diagnosis not present

## 2018-06-26 DIAGNOSIS — M6281 Muscle weakness (generalized): Secondary | ICD-10-CM | POA: Diagnosis not present

## 2018-06-26 DIAGNOSIS — I4819 Other persistent atrial fibrillation: Secondary | ICD-10-CM | POA: Diagnosis not present

## 2018-06-26 DIAGNOSIS — R2689 Other abnormalities of gait and mobility: Secondary | ICD-10-CM | POA: Diagnosis not present

## 2018-06-26 DIAGNOSIS — F0280 Dementia in other diseases classified elsewhere without behavioral disturbance: Secondary | ICD-10-CM | POA: Diagnosis not present

## 2018-06-27 DIAGNOSIS — R2689 Other abnormalities of gait and mobility: Secondary | ICD-10-CM | POA: Diagnosis not present

## 2018-06-27 DIAGNOSIS — G300 Alzheimer's disease with early onset: Secondary | ICD-10-CM | POA: Diagnosis not present

## 2018-06-27 DIAGNOSIS — I4819 Other persistent atrial fibrillation: Secondary | ICD-10-CM | POA: Diagnosis not present

## 2018-06-27 DIAGNOSIS — M6281 Muscle weakness (generalized): Secondary | ICD-10-CM | POA: Diagnosis not present

## 2018-06-27 DIAGNOSIS — F319 Bipolar disorder, unspecified: Secondary | ICD-10-CM | POA: Diagnosis not present

## 2018-06-27 DIAGNOSIS — F0280 Dementia in other diseases classified elsewhere without behavioral disturbance: Secondary | ICD-10-CM | POA: Diagnosis not present

## 2018-06-27 DIAGNOSIS — G20 Parkinson's disease: Secondary | ICD-10-CM | POA: Diagnosis not present

## 2018-06-27 DIAGNOSIS — R41841 Cognitive communication deficit: Secondary | ICD-10-CM | POA: Diagnosis not present

## 2018-06-28 DIAGNOSIS — F0280 Dementia in other diseases classified elsewhere without behavioral disturbance: Secondary | ICD-10-CM | POA: Diagnosis not present

## 2018-06-28 DIAGNOSIS — M6281 Muscle weakness (generalized): Secondary | ICD-10-CM | POA: Diagnosis not present

## 2018-06-28 DIAGNOSIS — R2689 Other abnormalities of gait and mobility: Secondary | ICD-10-CM | POA: Diagnosis not present

## 2018-06-28 DIAGNOSIS — G300 Alzheimer's disease with early onset: Secondary | ICD-10-CM | POA: Diagnosis not present

## 2018-06-28 DIAGNOSIS — F319 Bipolar disorder, unspecified: Secondary | ICD-10-CM | POA: Diagnosis not present

## 2018-06-28 DIAGNOSIS — R41841 Cognitive communication deficit: Secondary | ICD-10-CM | POA: Diagnosis not present

## 2018-06-28 DIAGNOSIS — G20 Parkinson's disease: Secondary | ICD-10-CM | POA: Diagnosis not present

## 2018-06-28 DIAGNOSIS — I4819 Other persistent atrial fibrillation: Secondary | ICD-10-CM | POA: Diagnosis not present

## 2018-06-29 DIAGNOSIS — F319 Bipolar disorder, unspecified: Secondary | ICD-10-CM | POA: Diagnosis not present

## 2018-06-29 DIAGNOSIS — I4819 Other persistent atrial fibrillation: Secondary | ICD-10-CM | POA: Diagnosis not present

## 2018-06-29 DIAGNOSIS — F0280 Dementia in other diseases classified elsewhere without behavioral disturbance: Secondary | ICD-10-CM | POA: Diagnosis not present

## 2018-06-29 DIAGNOSIS — R2689 Other abnormalities of gait and mobility: Secondary | ICD-10-CM | POA: Diagnosis not present

## 2018-06-29 DIAGNOSIS — M6281 Muscle weakness (generalized): Secondary | ICD-10-CM | POA: Diagnosis not present

## 2018-06-29 DIAGNOSIS — R41841 Cognitive communication deficit: Secondary | ICD-10-CM | POA: Diagnosis not present

## 2018-06-29 DIAGNOSIS — G20 Parkinson's disease: Secondary | ICD-10-CM | POA: Diagnosis not present

## 2018-06-29 DIAGNOSIS — G300 Alzheimer's disease with early onset: Secondary | ICD-10-CM | POA: Diagnosis not present

## 2018-07-01 DIAGNOSIS — M6281 Muscle weakness (generalized): Secondary | ICD-10-CM | POA: Diagnosis not present

## 2018-07-01 DIAGNOSIS — I4819 Other persistent atrial fibrillation: Secondary | ICD-10-CM | POA: Diagnosis not present

## 2018-07-01 DIAGNOSIS — R2689 Other abnormalities of gait and mobility: Secondary | ICD-10-CM | POA: Diagnosis not present

## 2018-07-01 DIAGNOSIS — G20 Parkinson's disease: Secondary | ICD-10-CM | POA: Diagnosis not present

## 2018-07-01 DIAGNOSIS — F0280 Dementia in other diseases classified elsewhere without behavioral disturbance: Secondary | ICD-10-CM | POA: Diagnosis not present

## 2018-07-01 DIAGNOSIS — G300 Alzheimer's disease with early onset: Secondary | ICD-10-CM | POA: Diagnosis not present

## 2018-07-01 DIAGNOSIS — R41841 Cognitive communication deficit: Secondary | ICD-10-CM | POA: Diagnosis not present

## 2018-07-01 DIAGNOSIS — F319 Bipolar disorder, unspecified: Secondary | ICD-10-CM | POA: Diagnosis not present

## 2018-07-02 DIAGNOSIS — I4819 Other persistent atrial fibrillation: Secondary | ICD-10-CM | POA: Diagnosis not present

## 2018-07-02 DIAGNOSIS — M6281 Muscle weakness (generalized): Secondary | ICD-10-CM | POA: Diagnosis not present

## 2018-07-02 DIAGNOSIS — F319 Bipolar disorder, unspecified: Secondary | ICD-10-CM | POA: Diagnosis not present

## 2018-07-02 DIAGNOSIS — R2689 Other abnormalities of gait and mobility: Secondary | ICD-10-CM | POA: Diagnosis not present

## 2018-07-02 DIAGNOSIS — F0280 Dementia in other diseases classified elsewhere without behavioral disturbance: Secondary | ICD-10-CM | POA: Diagnosis not present

## 2018-07-02 DIAGNOSIS — I10 Essential (primary) hypertension: Secondary | ICD-10-CM | POA: Diagnosis not present

## 2018-07-02 DIAGNOSIS — E785 Hyperlipidemia, unspecified: Secondary | ICD-10-CM | POA: Diagnosis not present

## 2018-07-02 DIAGNOSIS — R41841 Cognitive communication deficit: Secondary | ICD-10-CM | POA: Diagnosis not present

## 2018-07-02 DIAGNOSIS — G300 Alzheimer's disease with early onset: Secondary | ICD-10-CM | POA: Diagnosis not present

## 2018-07-02 DIAGNOSIS — G20 Parkinson's disease: Secondary | ICD-10-CM | POA: Diagnosis not present

## 2018-07-03 DIAGNOSIS — M6281 Muscle weakness (generalized): Secondary | ICD-10-CM | POA: Diagnosis not present

## 2018-07-03 DIAGNOSIS — F319 Bipolar disorder, unspecified: Secondary | ICD-10-CM | POA: Diagnosis not present

## 2018-07-03 DIAGNOSIS — G300 Alzheimer's disease with early onset: Secondary | ICD-10-CM | POA: Diagnosis not present

## 2018-07-03 DIAGNOSIS — R2689 Other abnormalities of gait and mobility: Secondary | ICD-10-CM | POA: Diagnosis not present

## 2018-07-03 DIAGNOSIS — G20 Parkinson's disease: Secondary | ICD-10-CM | POA: Diagnosis not present

## 2018-07-03 DIAGNOSIS — I4819 Other persistent atrial fibrillation: Secondary | ICD-10-CM | POA: Diagnosis not present

## 2018-07-03 DIAGNOSIS — R41841 Cognitive communication deficit: Secondary | ICD-10-CM | POA: Diagnosis not present

## 2018-07-03 DIAGNOSIS — F0280 Dementia in other diseases classified elsewhere without behavioral disturbance: Secondary | ICD-10-CM | POA: Diagnosis not present

## 2018-07-04 DIAGNOSIS — F319 Bipolar disorder, unspecified: Secondary | ICD-10-CM | POA: Diagnosis not present

## 2018-07-04 DIAGNOSIS — R2689 Other abnormalities of gait and mobility: Secondary | ICD-10-CM | POA: Diagnosis not present

## 2018-07-04 DIAGNOSIS — I4819 Other persistent atrial fibrillation: Secondary | ICD-10-CM | POA: Diagnosis not present

## 2018-07-04 DIAGNOSIS — F0280 Dementia in other diseases classified elsewhere without behavioral disturbance: Secondary | ICD-10-CM | POA: Diagnosis not present

## 2018-07-04 DIAGNOSIS — G300 Alzheimer's disease with early onset: Secondary | ICD-10-CM | POA: Diagnosis not present

## 2018-07-04 DIAGNOSIS — G20 Parkinson's disease: Secondary | ICD-10-CM | POA: Diagnosis not present

## 2018-07-04 DIAGNOSIS — M6281 Muscle weakness (generalized): Secondary | ICD-10-CM | POA: Diagnosis not present

## 2018-07-04 DIAGNOSIS — R41841 Cognitive communication deficit: Secondary | ICD-10-CM | POA: Diagnosis not present

## 2018-07-05 DIAGNOSIS — M6281 Muscle weakness (generalized): Secondary | ICD-10-CM | POA: Diagnosis not present

## 2018-07-05 DIAGNOSIS — Z950 Presence of cardiac pacemaker: Secondary | ICD-10-CM | POA: Diagnosis not present

## 2018-07-05 DIAGNOSIS — R41841 Cognitive communication deficit: Secondary | ICD-10-CM | POA: Diagnosis not present

## 2018-07-05 DIAGNOSIS — F329 Major depressive disorder, single episode, unspecified: Secondary | ICD-10-CM | POA: Diagnosis not present

## 2018-07-05 DIAGNOSIS — G20 Parkinson's disease: Secondary | ICD-10-CM | POA: Diagnosis not present

## 2018-07-05 DIAGNOSIS — F419 Anxiety disorder, unspecified: Secondary | ICD-10-CM | POA: Diagnosis not present

## 2018-07-05 DIAGNOSIS — R2689 Other abnormalities of gait and mobility: Secondary | ICD-10-CM | POA: Diagnosis not present

## 2018-07-05 DIAGNOSIS — F319 Bipolar disorder, unspecified: Secondary | ICD-10-CM | POA: Diagnosis not present

## 2018-07-05 DIAGNOSIS — I4819 Other persistent atrial fibrillation: Secondary | ICD-10-CM | POA: Diagnosis not present

## 2018-07-05 DIAGNOSIS — G300 Alzheimer's disease with early onset: Secondary | ICD-10-CM | POA: Diagnosis not present

## 2018-07-05 DIAGNOSIS — F0280 Dementia in other diseases classified elsewhere without behavioral disturbance: Secondary | ICD-10-CM | POA: Diagnosis not present

## 2018-07-08 ENCOUNTER — Telehealth: Payer: Self-pay | Admitting: *Deleted

## 2018-07-08 DIAGNOSIS — F329 Major depressive disorder, single episode, unspecified: Secondary | ICD-10-CM | POA: Diagnosis not present

## 2018-07-08 DIAGNOSIS — G300 Alzheimer's disease with early onset: Secondary | ICD-10-CM | POA: Diagnosis not present

## 2018-07-08 DIAGNOSIS — M6281 Muscle weakness (generalized): Secondary | ICD-10-CM | POA: Diagnosis not present

## 2018-07-08 DIAGNOSIS — R41841 Cognitive communication deficit: Secondary | ICD-10-CM | POA: Diagnosis not present

## 2018-07-08 DIAGNOSIS — F319 Bipolar disorder, unspecified: Secondary | ICD-10-CM | POA: Diagnosis not present

## 2018-07-08 DIAGNOSIS — R2689 Other abnormalities of gait and mobility: Secondary | ICD-10-CM | POA: Diagnosis not present

## 2018-07-08 DIAGNOSIS — G20 Parkinson's disease: Secondary | ICD-10-CM | POA: Diagnosis not present

## 2018-07-08 DIAGNOSIS — Z950 Presence of cardiac pacemaker: Secondary | ICD-10-CM | POA: Diagnosis not present

## 2018-07-08 DIAGNOSIS — I4819 Other persistent atrial fibrillation: Secondary | ICD-10-CM | POA: Diagnosis not present

## 2018-07-08 DIAGNOSIS — F419 Anxiety disorder, unspecified: Secondary | ICD-10-CM | POA: Diagnosis not present

## 2018-07-08 DIAGNOSIS — F0280 Dementia in other diseases classified elsewhere without behavioral disturbance: Secondary | ICD-10-CM | POA: Diagnosis not present

## 2018-07-08 NOTE — Telephone Encounter (Signed)
Called patient to let them know due to recent COVID19 CDC and Health Department Protocols, we are not seeing patients in the office. We are instead seeing if they would like to schedule this appointment as a Virtual Appointment VIA Smartphone or Laptop. Unable to reach patient.  Phone rings then hangs up.  

## 2018-07-09 DIAGNOSIS — I4819 Other persistent atrial fibrillation: Secondary | ICD-10-CM | POA: Diagnosis not present

## 2018-07-09 DIAGNOSIS — R2689 Other abnormalities of gait and mobility: Secondary | ICD-10-CM | POA: Diagnosis not present

## 2018-07-09 DIAGNOSIS — F319 Bipolar disorder, unspecified: Secondary | ICD-10-CM | POA: Diagnosis not present

## 2018-07-09 DIAGNOSIS — M6281 Muscle weakness (generalized): Secondary | ICD-10-CM | POA: Diagnosis not present

## 2018-07-09 DIAGNOSIS — G20 Parkinson's disease: Secondary | ICD-10-CM | POA: Diagnosis not present

## 2018-07-09 DIAGNOSIS — R41841 Cognitive communication deficit: Secondary | ICD-10-CM | POA: Diagnosis not present

## 2018-07-09 DIAGNOSIS — F0280 Dementia in other diseases classified elsewhere without behavioral disturbance: Secondary | ICD-10-CM | POA: Diagnosis not present

## 2018-07-09 DIAGNOSIS — G300 Alzheimer's disease with early onset: Secondary | ICD-10-CM | POA: Diagnosis not present

## 2018-07-10 DIAGNOSIS — R2689 Other abnormalities of gait and mobility: Secondary | ICD-10-CM | POA: Diagnosis not present

## 2018-07-10 DIAGNOSIS — I4819 Other persistent atrial fibrillation: Secondary | ICD-10-CM | POA: Diagnosis not present

## 2018-07-10 DIAGNOSIS — G300 Alzheimer's disease with early onset: Secondary | ICD-10-CM | POA: Diagnosis not present

## 2018-07-10 DIAGNOSIS — F0280 Dementia in other diseases classified elsewhere without behavioral disturbance: Secondary | ICD-10-CM | POA: Diagnosis not present

## 2018-07-10 DIAGNOSIS — M6281 Muscle weakness (generalized): Secondary | ICD-10-CM | POA: Diagnosis not present

## 2018-07-10 DIAGNOSIS — G20 Parkinson's disease: Secondary | ICD-10-CM | POA: Diagnosis not present

## 2018-07-10 DIAGNOSIS — F319 Bipolar disorder, unspecified: Secondary | ICD-10-CM | POA: Diagnosis not present

## 2018-07-10 DIAGNOSIS — R41841 Cognitive communication deficit: Secondary | ICD-10-CM | POA: Diagnosis not present

## 2018-07-11 DIAGNOSIS — G300 Alzheimer's disease with early onset: Secondary | ICD-10-CM | POA: Diagnosis not present

## 2018-07-11 DIAGNOSIS — M6281 Muscle weakness (generalized): Secondary | ICD-10-CM | POA: Diagnosis not present

## 2018-07-11 DIAGNOSIS — R2689 Other abnormalities of gait and mobility: Secondary | ICD-10-CM | POA: Diagnosis not present

## 2018-07-11 DIAGNOSIS — G20 Parkinson's disease: Secondary | ICD-10-CM | POA: Diagnosis not present

## 2018-07-11 DIAGNOSIS — R41841 Cognitive communication deficit: Secondary | ICD-10-CM | POA: Diagnosis not present

## 2018-07-11 DIAGNOSIS — I4819 Other persistent atrial fibrillation: Secondary | ICD-10-CM | POA: Diagnosis not present

## 2018-07-11 DIAGNOSIS — F0280 Dementia in other diseases classified elsewhere without behavioral disturbance: Secondary | ICD-10-CM | POA: Diagnosis not present

## 2018-07-11 DIAGNOSIS — F319 Bipolar disorder, unspecified: Secondary | ICD-10-CM | POA: Diagnosis not present

## 2018-07-11 NOTE — Telephone Encounter (Signed)
Called dtr for pt's number b/c we have not been able to get in touch with her about appt scheduled for Monday with Dr. Elberta Fortis.  Dtr reports pt is living at [facility] nursing home in [place]. Called and spoke to staff and arranged phone visit for Monday at 11am

## 2018-07-12 DIAGNOSIS — G20 Parkinson's disease: Secondary | ICD-10-CM | POA: Diagnosis not present

## 2018-07-12 DIAGNOSIS — F0280 Dementia in other diseases classified elsewhere without behavioral disturbance: Secondary | ICD-10-CM | POA: Diagnosis not present

## 2018-07-12 DIAGNOSIS — R41841 Cognitive communication deficit: Secondary | ICD-10-CM | POA: Diagnosis not present

## 2018-07-12 DIAGNOSIS — I4819 Other persistent atrial fibrillation: Secondary | ICD-10-CM | POA: Diagnosis not present

## 2018-07-12 DIAGNOSIS — G300 Alzheimer's disease with early onset: Secondary | ICD-10-CM | POA: Diagnosis not present

## 2018-07-12 DIAGNOSIS — R2689 Other abnormalities of gait and mobility: Secondary | ICD-10-CM | POA: Diagnosis not present

## 2018-07-12 DIAGNOSIS — F319 Bipolar disorder, unspecified: Secondary | ICD-10-CM | POA: Diagnosis not present

## 2018-07-12 DIAGNOSIS — M6281 Muscle weakness (generalized): Secondary | ICD-10-CM | POA: Diagnosis not present

## 2018-07-14 DIAGNOSIS — G20 Parkinson's disease: Secondary | ICD-10-CM | POA: Diagnosis not present

## 2018-07-14 DIAGNOSIS — G300 Alzheimer's disease with early onset: Secondary | ICD-10-CM | POA: Diagnosis not present

## 2018-07-14 DIAGNOSIS — R2689 Other abnormalities of gait and mobility: Secondary | ICD-10-CM | POA: Diagnosis not present

## 2018-07-14 DIAGNOSIS — R41841 Cognitive communication deficit: Secondary | ICD-10-CM | POA: Diagnosis not present

## 2018-07-14 DIAGNOSIS — F0280 Dementia in other diseases classified elsewhere without behavioral disturbance: Secondary | ICD-10-CM | POA: Diagnosis not present

## 2018-07-14 DIAGNOSIS — M6281 Muscle weakness (generalized): Secondary | ICD-10-CM | POA: Diagnosis not present

## 2018-07-14 DIAGNOSIS — F319 Bipolar disorder, unspecified: Secondary | ICD-10-CM | POA: Diagnosis not present

## 2018-07-14 DIAGNOSIS — I4819 Other persistent atrial fibrillation: Secondary | ICD-10-CM | POA: Diagnosis not present

## 2018-07-15 ENCOUNTER — Other Ambulatory Visit: Payer: Self-pay

## 2018-07-15 ENCOUNTER — Telehealth (INDEPENDENT_AMBULATORY_CARE_PROVIDER_SITE_OTHER): Payer: Medicare HMO | Admitting: Cardiology

## 2018-07-15 ENCOUNTER — Encounter: Payer: Self-pay | Admitting: Cardiology

## 2018-07-15 DIAGNOSIS — R2689 Other abnormalities of gait and mobility: Secondary | ICD-10-CM | POA: Diagnosis not present

## 2018-07-15 DIAGNOSIS — R41841 Cognitive communication deficit: Secondary | ICD-10-CM | POA: Diagnosis not present

## 2018-07-15 DIAGNOSIS — G20 Parkinson's disease: Secondary | ICD-10-CM | POA: Diagnosis not present

## 2018-07-15 DIAGNOSIS — I4819 Other persistent atrial fibrillation: Secondary | ICD-10-CM | POA: Diagnosis not present

## 2018-07-15 DIAGNOSIS — F0390 Unspecified dementia without behavioral disturbance: Secondary | ICD-10-CM | POA: Diagnosis not present

## 2018-07-15 DIAGNOSIS — F319 Bipolar disorder, unspecified: Secondary | ICD-10-CM | POA: Diagnosis not present

## 2018-07-15 DIAGNOSIS — F0280 Dementia in other diseases classified elsewhere without behavioral disturbance: Secondary | ICD-10-CM | POA: Diagnosis not present

## 2018-07-15 DIAGNOSIS — F329 Major depressive disorder, single episode, unspecified: Secondary | ICD-10-CM | POA: Diagnosis not present

## 2018-07-15 DIAGNOSIS — M6281 Muscle weakness (generalized): Secondary | ICD-10-CM | POA: Diagnosis not present

## 2018-07-15 DIAGNOSIS — Z950 Presence of cardiac pacemaker: Secondary | ICD-10-CM | POA: Diagnosis not present

## 2018-07-15 DIAGNOSIS — F419 Anxiety disorder, unspecified: Secondary | ICD-10-CM | POA: Diagnosis not present

## 2018-07-15 DIAGNOSIS — G300 Alzheimer's disease with early onset: Secondary | ICD-10-CM | POA: Diagnosis not present

## 2018-07-15 NOTE — Progress Notes (Signed)
Electrophysiology TeleHealth Note   Due to national recommendations of social distancing due to COVID 19, an audio/video telehealth visit is felt to be most appropriate for this patient at this time.  See Epic message for the patient's consent to telehealth for [facility].   Date:  07/15/2018   ID:  Jessica Hayes, DOB 1945/08/23, MRN [id number]  Location: patient's home  Provider location: [address], [place]  Evaluation Performed: Follow-up visit  PCP:  Wilmer Floor., MD  Cardiologist:  No primary care provider on file.  Electrophysiologist:  Dr Elberta Fortis  Chief Complaint:  pacemaker  History of Present Illness:    Jessica Hayes is a 73 y.o. female who presents via audio/video conferencing for a telehealth visit today.  Since last being seen in our clinic, the patient reports doing very well.  Today, she denies symptoms of palpitations, chest pain, shortness of breath,  lower extremity edema, dizziness, presyncope, or syncope.  The patient is otherwise without complaint today.  The patient denies symptoms of fevers, chills, cough, or new SOB worrisome for COVID 19.  She presented to the hospital 03/2018 with a UTI, found to have long pauses with her atrial fibrillation and is now s/p [employer] pacemaker 04/10/18.  Today, denies symptoms of palpitations, chest pain, shortness of breath, orthopnea, PND, lower extremity edema, claudication, dizziness, presyncope, syncope, bleeding, or neurologic sequela. The patient is tolerating medications without difficulties.    Past Medical History:  Diagnosis Date  . Alzheimer disease (HCC)   . Anxiety   . Bipolar disorder, unspecified (HCC)   . Dementia (HCC)   . Depressive disorder   . GERD (gastroesophageal reflux disease)   . Hypercholesteremia   . Hypertension   . Insomnia   . Lumbar disc disease    degenerative  . Parkinson disease (HCC)   . Restless leg syndrome   . Thyroid disease    hypothyroid   . UTI (urinary tract infection)   . Weakness     Past Surgical History:  Procedure Laterality Date  . PACEMAKER IMPLANT N/A 04/10/2018   Procedure: PACEMAKER IMPLANT;  Surgeon: Regan Lemming, MD;  Location: [facility];  Service: Cardiovascular;  Laterality: N/A;    Current Outpatient Medications  Medication Sig Dispense Refill  . aspirin EC 81 MG tablet Take 81 mg by mouth daily.    . carbidopa-levodopa (SINEMET IR) 25-100 MG tablet Take 1 tablet by mouth 3 (three) times daily.    . carvedilol (COREG) 6.25 MG tablet Take 1 tablet (6.25 mg total) by mouth 2 (two) times daily with a meal.    . cyclobenzaprine (FLEXERIL) 5 MG tablet Take 5 mg by mouth at bedtime.    . fenofibrate (TRICOR) 48 MG tablet Take 48 mg by mouth daily.    . furosemide (LASIX) 20 MG tablet Take 1 tablet (20 mg total) by mouth as needed for edema. (Patient taking differently: Take 20 mg by mouth daily as needed for edema. ) 30 tablet   . LACTOBACILLUS PO Take 1 capsule by mouth daily.    Marland Kitchen levothyroxine (SYNTHROID, LEVOTHROID) 50 MCG tablet Take 50 mcg by mouth daily before breakfast.    . OLANZapine (ZYPREXA) 15 MG tablet Take 15 mg by mouth at bedtime.    Marland Kitchen oxybutynin (DITROPAN-XL) 10 MG 24 hr tablet Take 10 mg by mouth at bedtime.    . pantoprazole (PROTONIX) 40 MG tablet Take 1 tablet (40 mg total) by mouth  daily. 30 tablet 0  . rOPINIRole (REQUIP) 0.5 MG tablet Take 0.5 mg by mouth at bedtime.    . temazepam (RESTORIL) 7.5 MG capsule Take 1 capsule (7.5 mg total) by mouth at bedtime. 5 capsule 0   No current facility-administered medications for this visit.     Allergies:   Demerol [meperidine hcl]   Social History:  The patient  has an unknown smoking status. She has never used smokeless tobacco. She reports previous alcohol use. She reports previous drug use.   Family History:  The patient's  family history includes Diabetes in her mother; Heart disease in her father and mother.   ROS:   Please see the history of present illness.   All other systems are personally reviewed and negative.    Exam:    Vital Signs:  BP 132/80   Pulse 87   Wt 179 lb 8 oz (81.4 kg)   BMI 30.81 kg/m   Over the phone, no acute distress, no shortness of breath.  Labs/Other Tests and Data Reviewed:    Recent Labs: 03/26/2018: TSH 0.986 04/09/2018: Magnesium 2.1 04/19/2018: ALT 16; BUN 14; Creatinine, Ser 1.01; Hemoglobin 13.3; Platelets 195; Potassium 4.0; Sodium 140   Wt Readings from Last 3 Encounters:  07/15/18 179 lb 8 oz (81.4 kg)  04/11/18 170 lb 6.7 oz (77.3 kg)  03/26/18 170 lb 6.7 oz (77.3 kg)     Other studies personally reviewed: Additional studies/ records that were reviewed today include: ECG 04/11/18 personally reviewed  Review of the above records today demonstrates:   AF, rate 113  Last device remote is reviewed from [employer] PDF dated 04/24/18 which reveals normal device function, 2.9% AF.   ASSESSMENT & PLAN:    1.  Paroxysmal atrial fibrillation: not anticoagulated due to frequent falls from Parkinson's. Minimal AF on device. No changes  2. Tachy/brady syndrome: s/p [employer] Jude dual chamber pacemaker implanted 04/10/18. Functioning appropriately.  No changes.   COVID 19 screen The patient denies symptoms of COVID 19 at this time.  The importance of social distancing was discussed today.  Follow-up:  3 months Next remote: 10/28/18  Current medicines are reviewed at length with the patient today.   The patient does not have concerns regarding her medicines.  The following changes were made today:  none  Labs/ tests ordered today include:  No orders of the defined types were placed in this encounter.    Patient Risk:  after full review of this patients clinical status, I feel that they are at moderate risk at this time.  Today, I have spent 11 minutes with the patient with telehealth technology discussing pacemaker atrial fibrillation .    Signed, Will Jorja LoaMartin  Camnitz, MD  07/15/2018 11:05 AM     [facility] [address] [place] [address] [phone] (office) [phone] (fax)

## 2018-07-16 DIAGNOSIS — G20 Parkinson's disease: Secondary | ICD-10-CM | POA: Diagnosis not present

## 2018-07-16 DIAGNOSIS — R2689 Other abnormalities of gait and mobility: Secondary | ICD-10-CM | POA: Diagnosis not present

## 2018-07-16 DIAGNOSIS — I4819 Other persistent atrial fibrillation: Secondary | ICD-10-CM | POA: Diagnosis not present

## 2018-07-16 DIAGNOSIS — R41841 Cognitive communication deficit: Secondary | ICD-10-CM | POA: Diagnosis not present

## 2018-07-16 DIAGNOSIS — G300 Alzheimer's disease with early onset: Secondary | ICD-10-CM | POA: Diagnosis not present

## 2018-07-16 DIAGNOSIS — F319 Bipolar disorder, unspecified: Secondary | ICD-10-CM | POA: Diagnosis not present

## 2018-07-16 DIAGNOSIS — F0280 Dementia in other diseases classified elsewhere without behavioral disturbance: Secondary | ICD-10-CM | POA: Diagnosis not present

## 2018-07-16 DIAGNOSIS — M6281 Muscle weakness (generalized): Secondary | ICD-10-CM | POA: Diagnosis not present

## 2018-07-17 DIAGNOSIS — F319 Bipolar disorder, unspecified: Secondary | ICD-10-CM | POA: Diagnosis not present

## 2018-07-17 DIAGNOSIS — I4819 Other persistent atrial fibrillation: Secondary | ICD-10-CM | POA: Diagnosis not present

## 2018-07-17 DIAGNOSIS — F0280 Dementia in other diseases classified elsewhere without behavioral disturbance: Secondary | ICD-10-CM | POA: Diagnosis not present

## 2018-07-17 DIAGNOSIS — M6281 Muscle weakness (generalized): Secondary | ICD-10-CM | POA: Diagnosis not present

## 2018-07-17 DIAGNOSIS — G20 Parkinson's disease: Secondary | ICD-10-CM | POA: Diagnosis not present

## 2018-07-17 DIAGNOSIS — G300 Alzheimer's disease with early onset: Secondary | ICD-10-CM | POA: Diagnosis not present

## 2018-07-17 DIAGNOSIS — R41841 Cognitive communication deficit: Secondary | ICD-10-CM | POA: Diagnosis not present

## 2018-07-17 DIAGNOSIS — R2689 Other abnormalities of gait and mobility: Secondary | ICD-10-CM | POA: Diagnosis not present

## 2018-07-18 DIAGNOSIS — R41841 Cognitive communication deficit: Secondary | ICD-10-CM | POA: Diagnosis not present

## 2018-07-18 DIAGNOSIS — F0280 Dementia in other diseases classified elsewhere without behavioral disturbance: Secondary | ICD-10-CM | POA: Diagnosis not present

## 2018-07-18 DIAGNOSIS — I4819 Other persistent atrial fibrillation: Secondary | ICD-10-CM | POA: Diagnosis not present

## 2018-07-18 DIAGNOSIS — G300 Alzheimer's disease with early onset: Secondary | ICD-10-CM | POA: Diagnosis not present

## 2018-07-18 DIAGNOSIS — F319 Bipolar disorder, unspecified: Secondary | ICD-10-CM | POA: Diagnosis not present

## 2018-07-18 DIAGNOSIS — G20 Parkinson's disease: Secondary | ICD-10-CM | POA: Diagnosis not present

## 2018-07-18 DIAGNOSIS — R2689 Other abnormalities of gait and mobility: Secondary | ICD-10-CM | POA: Diagnosis not present

## 2018-07-18 DIAGNOSIS — M6281 Muscle weakness (generalized): Secondary | ICD-10-CM | POA: Diagnosis not present

## 2018-07-19 DIAGNOSIS — F0280 Dementia in other diseases classified elsewhere without behavioral disturbance: Secondary | ICD-10-CM | POA: Diagnosis not present

## 2018-07-19 DIAGNOSIS — R2689 Other abnormalities of gait and mobility: Secondary | ICD-10-CM | POA: Diagnosis not present

## 2018-07-19 DIAGNOSIS — F319 Bipolar disorder, unspecified: Secondary | ICD-10-CM | POA: Diagnosis not present

## 2018-07-19 DIAGNOSIS — M6281 Muscle weakness (generalized): Secondary | ICD-10-CM | POA: Diagnosis not present

## 2018-07-19 DIAGNOSIS — I639 Cerebral infarction, unspecified: Secondary | ICD-10-CM | POA: Diagnosis not present

## 2018-07-19 DIAGNOSIS — G4700 Insomnia, unspecified: Secondary | ICD-10-CM | POA: Diagnosis not present

## 2018-07-19 DIAGNOSIS — R41841 Cognitive communication deficit: Secondary | ICD-10-CM | POA: Diagnosis not present

## 2018-07-19 DIAGNOSIS — I4819 Other persistent atrial fibrillation: Secondary | ICD-10-CM | POA: Diagnosis not present

## 2018-07-19 DIAGNOSIS — Z8673 Personal history of transient ischemic attack (TIA), and cerebral infarction without residual deficits: Secondary | ICD-10-CM | POA: Diagnosis not present

## 2018-07-22 DIAGNOSIS — M6281 Muscle weakness (generalized): Secondary | ICD-10-CM | POA: Diagnosis not present

## 2018-07-22 DIAGNOSIS — F0280 Dementia in other diseases classified elsewhere without behavioral disturbance: Secondary | ICD-10-CM | POA: Diagnosis not present

## 2018-07-22 DIAGNOSIS — F319 Bipolar disorder, unspecified: Secondary | ICD-10-CM | POA: Diagnosis not present

## 2018-07-22 DIAGNOSIS — I4819 Other persistent atrial fibrillation: Secondary | ICD-10-CM | POA: Diagnosis not present

## 2018-07-22 DIAGNOSIS — R41841 Cognitive communication deficit: Secondary | ICD-10-CM | POA: Diagnosis not present

## 2018-07-22 DIAGNOSIS — I639 Cerebral infarction, unspecified: Secondary | ICD-10-CM | POA: Diagnosis not present

## 2018-07-22 DIAGNOSIS — Z8673 Personal history of transient ischemic attack (TIA), and cerebral infarction without residual deficits: Secondary | ICD-10-CM | POA: Diagnosis not present

## 2018-07-22 DIAGNOSIS — R2689 Other abnormalities of gait and mobility: Secondary | ICD-10-CM | POA: Diagnosis not present

## 2018-08-05 DIAGNOSIS — F419 Anxiety disorder, unspecified: Secondary | ICD-10-CM | POA: Diagnosis not present

## 2018-08-05 DIAGNOSIS — N390 Urinary tract infection, site not specified: Secondary | ICD-10-CM | POA: Diagnosis not present

## 2018-08-05 DIAGNOSIS — F319 Bipolar disorder, unspecified: Secondary | ICD-10-CM | POA: Diagnosis not present

## 2018-08-05 DIAGNOSIS — R319 Hematuria, unspecified: Secondary | ICD-10-CM | POA: Diagnosis not present

## 2018-08-05 DIAGNOSIS — R0989 Other specified symptoms and signs involving the circulatory and respiratory systems: Secondary | ICD-10-CM | POA: Diagnosis not present

## 2018-08-05 DIAGNOSIS — D649 Anemia, unspecified: Secondary | ICD-10-CM | POA: Diagnosis not present

## 2018-08-05 DIAGNOSIS — R4182 Altered mental status, unspecified: Secondary | ICD-10-CM | POA: Diagnosis not present

## 2018-08-05 DIAGNOSIS — F329 Major depressive disorder, single episode, unspecified: Secondary | ICD-10-CM | POA: Diagnosis not present

## 2018-08-05 DIAGNOSIS — A000 Cholera due to Vibrio cholerae 01, biovar cholerae: Secondary | ICD-10-CM | POA: Diagnosis not present

## 2018-08-09 DIAGNOSIS — F329 Major depressive disorder, single episode, unspecified: Secondary | ICD-10-CM | POA: Diagnosis not present

## 2018-08-09 DIAGNOSIS — F319 Bipolar disorder, unspecified: Secondary | ICD-10-CM | POA: Diagnosis not present

## 2018-08-09 DIAGNOSIS — F0390 Unspecified dementia without behavioral disturbance: Secondary | ICD-10-CM | POA: Diagnosis not present

## 2018-08-09 DIAGNOSIS — N390 Urinary tract infection, site not specified: Secondary | ICD-10-CM | POA: Diagnosis not present

## 2018-08-12 ENCOUNTER — Other Ambulatory Visit: Payer: Self-pay

## 2018-08-12 ENCOUNTER — Emergency Department (HOSPITAL_COMMUNITY)
Admission: EM | Admit: 2018-08-12 | Discharge: 2018-08-12 | Disposition: A | Discharge status: Home / Self Care | Payer: Medicare HMO | Attending: Emergency Medicine | Admitting: Emergency Medicine

## 2018-08-12 ENCOUNTER — Encounter (HOSPITAL_COMMUNITY): Payer: Self-pay | Admitting: Emergency Medicine

## 2018-08-12 ENCOUNTER — Emergency Department (HOSPITAL_COMMUNITY): Payer: Medicare HMO

## 2018-08-12 DIAGNOSIS — I10 Essential (primary) hypertension: Secondary | ICD-10-CM | POA: Insufficient documentation

## 2018-08-12 DIAGNOSIS — I959 Hypotension, unspecified: Secondary | ICD-10-CM | POA: Diagnosis not present

## 2018-08-12 DIAGNOSIS — E039 Hypothyroidism, unspecified: Secondary | ICD-10-CM | POA: Insufficient documentation

## 2018-08-12 DIAGNOSIS — R0902 Hypoxemia: Secondary | ICD-10-CM | POA: Diagnosis not present

## 2018-08-12 DIAGNOSIS — Z950 Presence of cardiac pacemaker: Secondary | ICD-10-CM | POA: Diagnosis not present

## 2018-08-12 DIAGNOSIS — G20 Parkinson's disease: Secondary | ICD-10-CM | POA: Diagnosis not present

## 2018-08-12 DIAGNOSIS — R0789 Other chest pain: Secondary | ICD-10-CM | POA: Diagnosis not present

## 2018-08-12 DIAGNOSIS — Z79899 Other long term (current) drug therapy: Secondary | ICD-10-CM | POA: Insufficient documentation

## 2018-08-12 DIAGNOSIS — R079 Chest pain, unspecified: Secondary | ICD-10-CM | POA: Diagnosis not present

## 2018-08-12 DIAGNOSIS — M2550 Pain in unspecified joint: Secondary | ICD-10-CM | POA: Diagnosis not present

## 2018-08-12 DIAGNOSIS — Z7401 Bed confinement status: Secondary | ICD-10-CM | POA: Diagnosis not present

## 2018-08-12 DIAGNOSIS — Z7982 Long term (current) use of aspirin: Secondary | ICD-10-CM | POA: Diagnosis not present

## 2018-08-12 LAB — CBC WITH DIFFERENTIAL/PLATELET
Abs Immature Granulocytes: 0.03 10*3/uL (ref 0.00–0.07)
Basophils Absolute: 0 10*3/uL (ref 0.0–0.1)
Basophils Relative: 1 %
Eosinophils Absolute: 0.3 10*3/uL (ref 0.0–0.5)
Eosinophils Relative: 5 %
HCT: 43.8 % (ref 36.0–46.0)
Hemoglobin: 14.2 g/dL (ref 12.0–15.0)
Immature Granulocytes: 1 %
Lymphocytes Relative: 27 %
Lymphs Abs: 1.6 10*3/uL (ref 0.7–4.0)
MCH: 28.7 pg (ref 26.0–34.0)
MCHC: 32.4 g/dL (ref 30.0–36.0)
MCV: 88.7 fL (ref 80.0–100.0)
Monocytes Absolute: 0.5 10*3/uL (ref 0.1–1.0)
Monocytes Relative: 9 %
Neutro Abs: 3.4 10*3/uL (ref 1.7–7.7)
Neutrophils Relative %: 57 %
Platelets: 145 10*3/uL — ABNORMAL LOW (ref 150–400)
RBC: 4.94 MIL/uL (ref 3.87–5.11)
RDW: 13.5 % (ref 11.5–15.5)
WBC: 5.9 10*3/uL (ref 4.0–10.5)
nRBC: 0 % (ref 0.0–0.2)

## 2018-08-12 LAB — BASIC METABOLIC PANEL
Anion gap: 11 (ref 5–15)
BUN: 20 mg/dL (ref 8–23)
CO2: 23 mmol/L (ref 22–32)
Calcium: 9.4 mg/dL (ref 8.9–10.3)
Chloride: 107 mmol/L (ref 98–111)
Creatinine, Ser: 0.95 mg/dL (ref 0.44–1.00)
GFR calc Af Amer: >60 mL/min (ref >60)
GFR calc non Af Amer: 59 mL/min — ABNORMAL LOW (ref >60)
Glucose, Bld: 135 mg/dL — ABNORMAL HIGH (ref 70–99)
Potassium: 3.9 mmol/L (ref 3.5–5.1)
Sodium: 141 mmol/L (ref 135–145)

## 2018-08-12 LAB — TROPONIN I
Troponin I: <0.03 ng/mL (ref <0.03)
Troponin I: <0.03 ng/mL (ref <0.03)

## 2018-08-12 MED ORDER — MORPHINE SULFATE (PF) 4 MG/ML IV SOLN: 4.0000 mg | Freq: Once | INTRAVENOUS | Status: DC

## 2018-08-12 MED ORDER — MORPHINE SULFATE (PF) 2 MG/ML IV SOLN
2.0000 mg | Freq: Once | INTRAVENOUS | Status: AC
  Administered 2018-08-12: 2 mg via INTRAVENOUS
  Filled 2018-08-12: qty 1

## 2018-08-12 MED ORDER — LIDOCAINE 5 % EX PTCH: 1.0000 | MEDICATED_PATCH | CUTANEOUS | 0 refills | Status: AC

## 2018-08-12 NOTE — ED Notes (Signed)
Machine used to interrogate the [employer]. Curator working. We called the toll free # and someone is coming out to look at it.

## 2018-08-12 NOTE — ED Notes (Signed)
RN called report to [facility] where the patient lives and gave report before [facility] pick pt up.

## 2018-08-12 NOTE — ED Provider Notes (Signed)
[facility] Provider Note   CSN: [id number] Arrival date & time: 08/12/18  [id number]    History   Chief Complaint Chief Complaint  Patient presents with  . Chest Pain    HPI Jessica Hayes is a 73 y.o. female with history of Alzheimer's disease, GERD, HLD, HTN, Parkinson's disease, restless leg syndrome, persistent A. fib and tachybradycardia syndrome status post pacemaker placement presents for evaluation of acute onset, constant chest pains.  She reports that she awoke with a chest pain at around 7 AM and it was generalized at the time.  She has a hard time describing the pain.  No aggravating or alleviating factors noted.  EMS gave 324 mg aspirin and 1 sublingual nitroglycerin with improvement in her pain from 8/10 in severity to 4/10 in severity.  She notes some associated shortness of breath.  Denies lightheadedness, nausea, vomiting, abdominal pain, fever, or cough.  She is currently being treated for a UTI with Macrobid which was initiated 3 days ago.  She is a poor historian.  She is a never smoker.     The history is provided by the patient.    Past Medical History:  Diagnosis Date  . Alzheimer disease (HCC)   . Anxiety   . Bipolar disorder, unspecified (HCC)   . Dementia (HCC)   . Depressive disorder   . GERD (gastroesophageal reflux disease)   . Hypercholesteremia   . Hypertension   . Insomnia   . Lumbar disc disease    degenerative  . Parkinson disease (HCC)   . Restless leg syndrome   . Thyroid disease    hypothyroid  . UTI (urinary tract infection)   . Weakness     Patient Active Problem List   Diagnosis Date Noted  . Pacemaker   . Tachy-brady syndrome (HCC) 04/10/2018  . Chest pain, rule out acute myocardial infarction 04/09/2018  . Goals of care, counseling/discussion   . Palliative care by specialist   . Persistent atrial fibrillation 03/28/2018  . Sinus pause 03/28/2018  . Acute lower UTI 03/28/2018  .  Dehydration 03/26/2018  . Alzheimer's dementia (HCC) 03/26/2018  . Essential hypertension 03/26/2018  . Hyperlipidemia 03/26/2018  . Hypothyroidism 03/26/2018  . AMS (altered mental status) 03/25/2018    Past Surgical History:  Procedure Laterality Date  . PACEMAKER IMPLANT N/A 04/10/2018   Procedure: PACEMAKER IMPLANT;  Surgeon: Regan Lemmingamnitz, Will Martin, MD;  Location: [facility];  Service: Cardiovascular;  Laterality: N/A;     OB History   No obstetric history on file.      Home Medications    Prior to Admission medications   Medication Sig Start Date End Date Taking? Authorizing Provider  aspirin EC 81 MG tablet Take 81 mg by mouth daily.    [provider]  carbidopa-levodopa (SINEMET IR) 25-100 MG tablet Take 1 tablet by mouth 3 (three) times daily.    [provider]  carvedilol (COREG) 6.25 MG tablet Take 1 tablet (6.25 mg total) by mouth 2 (two) times daily with a meal. 04/11/18   Gherghe, Daylene Katayamaostin M, MD  cyclobenzaprine (FLEXERIL) 5 MG tablet Take 5 mg by mouth at bedtime.    [provider]  fenofibrate (TRICOR) 48 MG tablet Take 48 mg by mouth daily.    [provider]  furosemide (LASIX) 20 MG tablet Take 1 tablet (20 mg total) by mouth as needed for edema. Patient taking differently: Take 20 mg by mouth daily as needed for  edema.  03/31/18   Roberto Scales D, MD  LACTOBACILLUS PO Take 1 capsule by mouth daily.    [provider]  levothyroxine (SYNTHROID, LEVOTHROID) 50 MCG tablet Take 50 mcg by mouth daily before breakfast.    [provider]  lidocaine (LIDODERM) 5 % Place 1 patch onto the skin daily. Remove & Discard patch within 12 hours or as directed by MD 08/12/18   Michela Pitcher A, PA-C  OLANZapine (ZYPREXA) 15 MG tablet Take 15 mg by mouth at bedtime.    [provider]  oxybutynin (DITROPAN-XL) 10 MG 24 hr tablet Take 10 mg by mouth at bedtime.    [provider]  pantoprazole (PROTONIX) 40  MG tablet Take 1 tablet (40 mg total) by mouth daily. 04/10/18   Almon Hercules, MD  rOPINIRole (REQUIP) 0.5 MG tablet Take 0.5 mg by mouth at bedtime.    [provider]  temazepam (RESTORIL) 7.5 MG capsule Take 1 capsule (7.5 mg total) by mouth at bedtime. 04/11/18   Leatha Gilding, MD    Family History Family History  Problem Relation Age of Onset  . Diabetes Mother   . Heart disease Mother   . Heart disease Father     Social History Social History   Tobacco Use  . Smoking status: Unknown If Ever Smoked  . Smokeless tobacco: Never Used  Substance Use Topics  . Alcohol use: Not Currently  . Drug use: Not Currently     Allergies   Demerol [meperidine hcl]   Review of Systems Review of Systems  Constitutional: Negative for chills, diaphoresis and fever.  Respiratory: Positive for shortness of breath. Negative for cough.   Cardiovascular: Positive for chest pain. Negative for leg swelling.  Gastrointestinal: Negative for abdominal pain, nausea and vomiting.  Neurological: Negative for light-headedness.  All other systems reviewed and are negative.    Physical Exam Updated Vital Signs BP (!) 128/59   Pulse 60   Temp 98.3 F (36.8 C) (Oral)   Resp 11   Ht  (1.626 m)   Wt 81.4 kg   SpO2 95%   BMI 30.80 kg/m   Physical Exam Vitals signs and nursing note reviewed.  Constitutional:      General: She is not in acute distress.    Appearance: She is well-developed.  HENT:     Head: Normocephalic and atraumatic.  Eyes:     General:        Right eye: No discharge.        Left eye: No discharge.     Conjunctiva/sclera: Conjunctivae normal.  Neck:     Vascular: No JVD.     Trachea: No tracheal deviation.  Cardiovascular:     Rate and Rhythm: Normal rate and regular rhythm.     Pulses:          Radial pulses are 2+ on the right side and 2+ on the left side.       Dorsalis pedis pulses are 2+ on the right side and 2+ on the left side.        Posterior tibial pulses are 2+ on the right side and 2+ on the left side.     Comments: Homan's sign absent bilaterally. Pulmonary:     Effort: Pulmonary effort is normal.  Abdominal:     General: Bowel sounds are normal. There is no distension.     Palpations: Abdomen is soft. There is no mass.     Tenderness: There  is no abdominal tenderness. There is no guarding or rebound.  Musculoskeletal:     Right lower leg: She exhibits no tenderness. No edema.     Left lower leg: She exhibits no tenderness. No edema.  Skin:    General: Skin is warm and dry.     Findings: No erythema.  Neurological:     Mental Status: She is alert.     Comments: Slow to answer questions.  Oriented to person place and year.  She knows who the president of [place] is.  Psychiatric:        Mood and Affect: Affect is flat.        Behavior: Behavior normal.     Comments: Flat affect      ED Treatments / Results  Labs (all labs ordered are listed, but only abnormal results are displayed) Labs Reviewed  BASIC METABOLIC PANEL - Abnormal; Notable for the following components:      Result Value   Glucose, Bld 135 (*)    GFR calc non Af Amer 59 (*)    All other components within normal limits  CBC WITH DIFFERENTIAL/PLATELET - Abnormal; Notable for the following components:   Platelets 145 (*)    All other components within normal limits  TROPONIN I  TROPONIN I    EKG EKG Interpretation  Date/Time:  Monday Aug 12 2018 08:41:40 EDT Ventricular Rate:  70 PR Interval:    QRS Duration: 87 QT Interval:  447 QTC Calculation: 483 R Axis:   -14 Text Interpretation:  Sinus rhythm Non-specific ST-t changes Confirmed by Cathren Laine ([id number]) on 08/12/2018 8:44:11 AM   Radiology Dg Chest Port 1 View  Result Date: 08/12/2018 CLINICAL DATA:  Chest pain EXAM: PORTABLE CHEST 1 VIEW COMPARISON:  05/19/2017 FINDINGS: There is chronic elevation of the right diaphragm. There is no focal parenchymal opacity.  There is no pleural effusion or pneumothorax. The heart and mediastinal contours are unremarkable. There is a dual lead cardiac pacemaker present. The osseous structures are unremarkable. IMPRESSION: No acute cardiopulmonary disease. Electronically Signed   By: Elige Ko   On: 08/12/2018 09:38    Procedures Procedures (including critical care time)  Medications Ordered in ED Medications  morphine 2 MG/ML injection 2 mg (2 mg Intravenous Given 08/12/18 1045)     Initial Impression / Assessment and Plan / ED Course  I have reviewed the triage vital signs and the nursing notes.  Pertinent labs & imaging results that were available during my care of the patient were reviewed by me and considered in my medical decision making (see chart for details).        Patient presenting for evaluation of chest pains that began this morning.  She is afebrile, vital signs are stable.  She is nontoxic in appearance.  She is a poor historian and cannot remember many details regarding her symptoms including the fact that the EMTs gave her medication which she initially reported was helpful.  Will obtain blood work, EKG, chest x-ray and reassess.  EKG shows nonspecific ST-T wave changes.  Serial troponins are negative which is reassuring and I have a low suspicion of ACS/MI.  Remainder of lab work reviewed by me shows no leukocytosis, no anemia, no metabolic derangements.  Chest x-ray shows no acute cardiopulmonary abnormalities with no evidence of pneumonia or CHF exacerbation.  11:12 AM Spoke with Dahlia Client with [employer], the patient's pacemaker manufacturer.  Reports the patient has not had any abnormal rhythms today but did  have a brief episode of atrial tachycardia lasting for seconds.  Otherwise no concerning abnormalities recently.  On reevaluation patient is resting comfortably in no apparent distress.  She reports that she is feeling better and would like something to eat.  I have a low suspicion of  PE, dissection, cardiac tamponade, esophageal rupture, pneumothorax, pericarditis, or myocarditis.  Stable for discharge back to her facility with follow-up with her cardiologist on an outpatient basis.  We discussed strict ED return precautions.  Patient verbalized understanding of and agreement with plan and patient stable for discharge at this time.  Patient seen and evaluated by Dr. Denton Lank who agrees with assessment and plan at this time. Final Clinical Impressions(s) / ED Diagnoses   Final diagnoses:  Atypical chest pain    ED Discharge Orders         Ordered    lidocaine (LIDODERM) 5 %  Every 24 hours     08/12/18 1144           Jeanie Sewer, PA-C 08/12/18 1331    Cathren Laine, MD 08/13/18 1409

## 2018-08-12 NOTE — ED Notes (Signed)
[facility] called @ 1404-per RN-called by Marylene Land

## 2018-08-12 NOTE — Discharge Instructions (Addendum)
Your work-up today was reassuring that you are not having a heart attack.  We also interrogated your pacemaker which was reassuring.  Call your cardiologist to set up follow-up for reevaluation.  Return to the emergency department if any concerning signs or symptoms develop such as high fevers, persistent vomiting, worsening chest pain or shortness of breath, or passing out.

## 2018-08-12 NOTE — ED Triage Notes (Signed)
Pt arrives via EMS from [facility] nursing home. CO chest pain since 0700 on the left side non radiating. Pain is an 8. EMS gave 324 aspirin and 1 nitro.pain is now a 4.  Pt received a pacemaker in Jan 2020.  Hx. Dementia and parkinsons

## 2018-08-13 ENCOUNTER — Ambulatory Visit (INDEPENDENT_AMBULATORY_CARE_PROVIDER_SITE_OTHER): Payer: Medicare HMO | Admitting: *Deleted

## 2018-08-13 DIAGNOSIS — R001 Bradycardia, unspecified: Secondary | ICD-10-CM

## 2018-08-13 LAB — CUP PACEART REMOTE DEVICE CHECK
Date Time Interrogation Session: 20200526112129
Implantable Lead Implant Date: 20200122
Implantable Lead Implant Date: 20200122
Implantable Lead Location: 753859
Implantable Lead Location: 753860
Implantable Pulse Generator Implant Date: 20200122
Pulse Gen Model: 2272
Pulse Gen Serial Number: 9101187

## 2018-08-14 DIAGNOSIS — F329 Major depressive disorder, single episode, unspecified: Secondary | ICD-10-CM | POA: Diagnosis not present

## 2018-08-14 DIAGNOSIS — F319 Bipolar disorder, unspecified: Secondary | ICD-10-CM | POA: Diagnosis not present

## 2018-08-14 DIAGNOSIS — F419 Anxiety disorder, unspecified: Secondary | ICD-10-CM | POA: Diagnosis not present

## 2018-08-16 DIAGNOSIS — G20 Parkinson's disease: Secondary | ICD-10-CM | POA: Diagnosis not present

## 2018-08-16 DIAGNOSIS — F419 Anxiety disorder, unspecified: Secondary | ICD-10-CM | POA: Diagnosis not present

## 2018-08-16 DIAGNOSIS — F0390 Unspecified dementia without behavioral disturbance: Secondary | ICD-10-CM | POA: Diagnosis not present

## 2018-08-21 NOTE — Progress Notes (Signed)
Remote pacemaker transmission.   

## 2018-08-30 DIAGNOSIS — G20 Parkinson's disease: Secondary | ICD-10-CM | POA: Diagnosis not present

## 2018-08-30 DIAGNOSIS — F419 Anxiety disorder, unspecified: Secondary | ICD-10-CM | POA: Diagnosis not present

## 2018-08-30 DIAGNOSIS — F329 Major depressive disorder, single episode, unspecified: Secondary | ICD-10-CM | POA: Diagnosis not present

## 2018-08-30 DIAGNOSIS — F0390 Unspecified dementia without behavioral disturbance: Secondary | ICD-10-CM | POA: Diagnosis not present

## 2018-09-05 ENCOUNTER — Emergency Department (HOSPITAL_COMMUNITY): Payer: Medicare HMO

## 2018-09-05 ENCOUNTER — Encounter (HOSPITAL_COMMUNITY): Payer: Self-pay | Admitting: Emergency Medicine

## 2018-09-05 ENCOUNTER — Inpatient Hospital Stay (HOSPITAL_COMMUNITY)
Admission: EM | Admit: 2018-09-05 | Discharge: 2018-09-09 | DRG: 394 | Disposition: A | Discharge status: Skilled Nursing Facility | Payer: Medicare HMO | Source: Skilled Nursing Facility | Attending: Internal Medicine | Admitting: Internal Medicine

## 2018-09-05 ENCOUNTER — Other Ambulatory Visit: Payer: Self-pay

## 2018-09-05 DIAGNOSIS — T18128D Food in esophagus causing other injury, subsequent encounter: Secondary | ICD-10-CM | POA: Diagnosis not present

## 2018-09-05 DIAGNOSIS — Z66 Do not resuscitate: Secondary | ICD-10-CM | POA: Diagnosis present

## 2018-09-05 DIAGNOSIS — Z79890 Hormone replacement therapy (postmenopausal): Secondary | ICD-10-CM

## 2018-09-05 DIAGNOSIS — Z1159 Encounter for screening for other viral diseases: Secondary | ICD-10-CM

## 2018-09-05 DIAGNOSIS — R0902 Hypoxemia: Secondary | ICD-10-CM | POA: Diagnosis not present

## 2018-09-05 DIAGNOSIS — G20 Parkinson's disease: Secondary | ICD-10-CM | POA: Diagnosis present

## 2018-09-05 DIAGNOSIS — I4819 Other persistent atrial fibrillation: Secondary | ICD-10-CM | POA: Diagnosis not present

## 2018-09-05 DIAGNOSIS — E782 Mixed hyperlipidemia: Secondary | ICD-10-CM

## 2018-09-05 DIAGNOSIS — Z79899 Other long term (current) drug therapy: Secondary | ICD-10-CM

## 2018-09-05 DIAGNOSIS — G2581 Restless legs syndrome: Secondary | ICD-10-CM | POA: Diagnosis present

## 2018-09-05 DIAGNOSIS — K2970 Gastritis, unspecified, without bleeding: Secondary | ICD-10-CM | POA: Diagnosis present

## 2018-09-05 DIAGNOSIS — K219 Gastro-esophageal reflux disease without esophagitis: Secondary | ICD-10-CM | POA: Diagnosis present

## 2018-09-05 DIAGNOSIS — E785 Hyperlipidemia, unspecified: Secondary | ICD-10-CM | POA: Diagnosis present

## 2018-09-05 DIAGNOSIS — E7800 Pure hypercholesterolemia, unspecified: Secondary | ICD-10-CM | POA: Diagnosis present

## 2018-09-05 DIAGNOSIS — F0280 Dementia in other diseases classified elsewhere without behavioral disturbance: Secondary | ICD-10-CM | POA: Diagnosis present

## 2018-09-05 DIAGNOSIS — Z7401 Bed confinement status: Secondary | ICD-10-CM | POA: Diagnosis not present

## 2018-09-05 DIAGNOSIS — M2550 Pain in unspecified joint: Secondary | ICD-10-CM | POA: Diagnosis not present

## 2018-09-05 DIAGNOSIS — T18128A Food in esophagus causing other injury, initial encounter: Secondary | ICD-10-CM | POA: Diagnosis not present

## 2018-09-05 DIAGNOSIS — R404 Transient alteration of awareness: Secondary | ICD-10-CM | POA: Diagnosis not present

## 2018-09-05 DIAGNOSIS — E860 Dehydration: Secondary | ICD-10-CM | POA: Diagnosis present

## 2018-09-05 DIAGNOSIS — Z7982 Long term (current) use of aspirin: Secondary | ICD-10-CM

## 2018-09-05 DIAGNOSIS — G309 Alzheimer's disease, unspecified: Secondary | ICD-10-CM | POA: Diagnosis present

## 2018-09-05 DIAGNOSIS — F319 Bipolar disorder, unspecified: Secondary | ICD-10-CM | POA: Diagnosis present

## 2018-09-05 DIAGNOSIS — N390 Urinary tract infection, site not specified: Secondary | ICD-10-CM | POA: Diagnosis not present

## 2018-09-05 DIAGNOSIS — X58XXXA Exposure to other specified factors, initial encounter: Secondary | ICD-10-CM | POA: Diagnosis present

## 2018-09-05 DIAGNOSIS — T17220A Food in pharynx causing asphyxiation, initial encounter: Secondary | ICD-10-CM | POA: Diagnosis not present

## 2018-09-05 DIAGNOSIS — R52 Pain, unspecified: Secondary | ICD-10-CM | POA: Diagnosis not present

## 2018-09-05 DIAGNOSIS — I10 Essential (primary) hypertension: Secondary | ICD-10-CM | POA: Diagnosis not present

## 2018-09-05 DIAGNOSIS — R1310 Dysphagia, unspecified: Secondary | ICD-10-CM | POA: Diagnosis present

## 2018-09-05 DIAGNOSIS — T17920A Food in respiratory tract, part unspecified causing asphyxiation, initial encounter: Secondary | ICD-10-CM | POA: Diagnosis not present

## 2018-09-05 DIAGNOSIS — G300 Alzheimer's disease with early onset: Secondary | ICD-10-CM

## 2018-09-05 DIAGNOSIS — Z20828 Contact with and (suspected) exposure to other viral communicable diseases: Secondary | ICD-10-CM | POA: Diagnosis not present

## 2018-09-05 DIAGNOSIS — R918 Other nonspecific abnormal finding of lung field: Secondary | ICD-10-CM | POA: Diagnosis not present

## 2018-09-05 DIAGNOSIS — R5381 Other malaise: Secondary | ICD-10-CM | POA: Diagnosis not present

## 2018-09-05 DIAGNOSIS — Z950 Presence of cardiac pacemaker: Secondary | ICD-10-CM

## 2018-09-05 DIAGNOSIS — K3189 Other diseases of stomach and duodenum: Secondary | ICD-10-CM | POA: Diagnosis not present

## 2018-09-05 DIAGNOSIS — E039 Hypothyroidism, unspecified: Secondary | ICD-10-CM | POA: Diagnosis not present

## 2018-09-05 DIAGNOSIS — K228 Other specified diseases of esophagus: Secondary | ICD-10-CM | POA: Diagnosis not present

## 2018-09-05 DIAGNOSIS — R0989 Other specified symptoms and signs involving the circulatory and respiratory systems: Secondary | ICD-10-CM

## 2018-09-05 DIAGNOSIS — R0602 Shortness of breath: Secondary | ICD-10-CM | POA: Diagnosis not present

## 2018-09-05 LAB — CBC WITH DIFFERENTIAL/PLATELET
Abs Immature Granulocytes: 0.11 10*3/uL — ABNORMAL HIGH (ref 0.00–0.07)
Basophils Absolute: 0 10*3/uL (ref 0.0–0.1)
Basophils Relative: 1 %
Eosinophils Absolute: 0.3 10*3/uL (ref 0.0–0.5)
Eosinophils Relative: 4 %
HCT: 44.3 % (ref 36.0–46.0)
Hemoglobin: 14.3 g/dL (ref 12.0–15.0)
Immature Granulocytes: 2 %
Lymphocytes Relative: 32 %
Lymphs Abs: 2.3 10*3/uL (ref 0.7–4.0)
MCH: 29 pg (ref 26.0–34.0)
MCHC: 32.3 g/dL (ref 30.0–36.0)
MCV: 89.9 fL (ref 80.0–100.0)
Monocytes Absolute: 0.5 10*3/uL (ref 0.1–1.0)
Monocytes Relative: 7 %
Neutro Abs: 4 10*3/uL (ref 1.7–7.7)
Neutrophils Relative %: 54 %
Platelets: 175 10*3/uL (ref 150–400)
RBC: 4.93 MIL/uL (ref 3.87–5.11)
RDW: 13 % (ref 11.5–15.5)
WBC: 7.2 10*3/uL (ref 4.0–10.5)
nRBC: 0 % (ref 0.0–0.2)

## 2018-09-05 LAB — URINALYSIS, ROUTINE W REFLEX MICROSCOPIC
Bilirubin Urine: NEGATIVE
Glucose, UA: NEGATIVE mg/dL
Hgb urine dipstick: NEGATIVE
Ketones, ur: NEGATIVE mg/dL
Nitrite: POSITIVE — AB
Protein, ur: NEGATIVE mg/dL
Specific Gravity, Urine: >1.046 — ABNORMAL HIGH (ref 1.005–1.030)
pH: 6 (ref 5.0–8.0)

## 2018-09-05 LAB — BASIC METABOLIC PANEL
Anion gap: 10 (ref 5–15)
BUN: 18 mg/dL (ref 8–23)
CO2: 21 mmol/L — ABNORMAL LOW (ref 22–32)
Calcium: 8.6 mg/dL — ABNORMAL LOW (ref 8.9–10.3)
Chloride: 105 mmol/L (ref 98–111)
Creatinine, Ser: 1.06 mg/dL — ABNORMAL HIGH (ref 0.44–1.00)
GFR calc Af Amer: 60 mL/min — ABNORMAL LOW (ref >60)
GFR calc non Af Amer: 52 mL/min — ABNORMAL LOW (ref >60)
Glucose, Bld: 192 mg/dL — ABNORMAL HIGH (ref 70–99)
Potassium: 4.6 mmol/L (ref 3.5–5.1)
Sodium: 136 mmol/L (ref 135–145)

## 2018-09-05 LAB — SARS CORONAVIRUS 2: SARS Coronavirus 2: NOT DETECTED

## 2018-09-05 MED ORDER — ONDANSETRON HCL 4 MG/2ML IJ SOLN
4.0000 mg | Freq: Once | INTRAMUSCULAR | Status: AC
  Administered 2018-09-05: 4 mg via INTRAVENOUS
  Filled 2018-09-05: qty 2

## 2018-09-05 MED ORDER — IOHEXOL 300 MG/ML  SOLN
75.0000 mL | Freq: Once | INTRAMUSCULAR | Status: AC | PRN
  Administered 2018-09-05: 20:00:00 75 mL via INTRAVENOUS

## 2018-09-05 NOTE — ED Provider Notes (Signed)
Jessica [facility] Provider Note   CSN: [id number] Arrival date & time: 09/05/18  1826    History   Chief Complaint Chief Complaint  Patient presents with   Choking    HPI Jessica Hayes is a 73 y.o. female.     Jessica Hayes is a 73 y.o. female with a history of Alzheimer's, Parkinson's, hypertension, hyperlipidemia, GERD, hypothyroidism, who presents to the emergency [facility] from a [facility] via EMS after choking episode.  Per EMS when they arrived on the scene fire had attempted the Heimlich maneuver unsteadily and patient was unresponsive and groaning, they were able to remove a large piece of chicken from the patient's throat after which she regained responsiveness and was placed on a nonrebreather due to hypoxia.  She endorses some discomfort over her lower ribs and continues to feel short of breath.  Reports she had a similar choking episode a few weeks ago with broccoli but this 1 was less severe.  Patient reports despite choking episodes she is still on a normal diet without thickened liquids.  She denies any nausea or vomiting, no abdominal pain.  Reports chronic issues with urinary tract infections and does have some suprapubic pain currently.  No other complaints.  On arrival to the emergency [facility] patient switched to nasal cannula and breathing comfortably.     Past Medical History:  Diagnosis Date   Alzheimer disease ([place])    Anxiety    Bipolar disorder, unspecified ([place])    Dementia ([place])    Depressive disorder    GERD (gastroesophageal reflux disease)    Hypercholesteremia    Hypertension    Insomnia    Lumbar disc disease    degenerative   Parkinson disease ([place])    Restless leg syndrome    Thyroid disease    hypothyroid   UTI (urinary tract infection)    Weakness     Patient Active Problem List   Diagnosis Date Noted   Pacemaker    Tachy-brady syndrome ([place]) 04/10/2018   Chest  pain, rule out acute myocardial infarction 04/09/2018   Goals of care, counseling/discussion    Palliative care by specialist    Persistent atrial fibrillation 03/28/2018   Sinus pause 03/28/2018   Acute lower UTI 03/28/2018   Dehydration 03/26/2018   Alzheimer's dementia ([place]) 03/26/2018   Essential hypertension 03/26/2018   Hyperlipidemia 03/26/2018   Hypothyroidism 03/26/2018   AMS (altered mental status) 03/25/2018    Past Surgical History:  Procedure Laterality Date   PACEMAKER IMPLANT N/A 04/10/2018   Procedure: PACEMAKER IMPLANT;  Surgeon: Constance Haw, MD;  Location: [facility];  Service: Cardiovascular;  Laterality: N/A;     OB History   No obstetric history on file.      Home Medications    Prior to Admission medications   Medication Sig Start Date End Date Taking? Authorizing Provider  aspirin EC 81 MG tablet Take 81 mg by mouth daily.    [provider]  carbidopa-levodopa (SINEMET IR) 25-100 MG tablet Take 1 tablet by mouth 3 (three) times daily.    [provider]  carvedilol (COREG) 6.25 MG tablet Take 1 tablet (6.25 mg total) by mouth 2 (two) times daily with a meal. 04/11/18   Gherghe, Vella Redhead, MD  cyclobenzaprine (FLEXERIL) 5 MG tablet Take 5 mg by mouth at bedtime.    [provider]  fenofibrate (TRICOR) 48 MG tablet Take 48 mg by mouth daily.  [provider]  furosemide (LASIX) 20 MG tablet Take 1 tablet (20 mg total) by mouth as needed for edema. Patient taking differently: Take 20 mg by mouth daily as needed for edema.  03/31/18   Roberto ScalesNettey, Shayla D, MD  LACTOBACILLUS PO Take 1 capsule by mouth daily.    [provider]  levothyroxine (SYNTHROID, LEVOTHROID) 50 MCG tablet Take 50 mcg by mouth daily before breakfast.    [provider]  lidocaine (LIDODERM) 5 % Place 1 patch onto the skin daily. Remove & Discard patch within 12 hours or as directed by MD 08/12/18   Michela PitcherFawze, Mina  A, PA-C  OLANZapine (ZYPREXA) 15 MG tablet Take 15 mg by mouth at bedtime.    [provider]  oxybutynin (DITROPAN-XL) 10 MG 24 hr tablet Take 10 mg by mouth at bedtime.    [provider]  pantoprazole (PROTONIX) 40 MG tablet Take 1 tablet (40 mg total) by mouth daily. 04/10/18   Almon HerculesGonfa, Taye T, MD  rOPINIRole (REQUIP) 0.5 MG tablet Take 0.5 mg by mouth at bedtime.    [provider]  temazepam (RESTORIL) 7.5 MG capsule Take 1 capsule (7.5 mg total) by mouth at bedtime. 04/11/18   Leatha GildingGherghe, Costin M, MD    Family History Family History  Problem Relation Age of Onset   Diabetes Mother    Heart disease Mother    Heart disease Father     Social History Social History   Tobacco Use   Smoking status: Unknown If Ever Smoked   Smokeless tobacco: Never Used  Substance Use Topics   Alcohol use: Not Currently   Drug use: Not Currently     Allergies   Demerol [meperidine hcl]   Review of Systems Review of Systems  Constitutional: Negative for chills and fever.  HENT: Negative.   Respiratory: Positive for cough, choking and chest tightness.   Cardiovascular: Positive for chest pain.  Gastrointestinal: Negative for abdominal pain, nausea and vomiting.  Genitourinary: Negative for dysuria and frequency.  Musculoskeletal: Negative for arthralgias and joint swelling.  Skin: Negative for color change and rash.  Neurological: Negative for dizziness, syncope and light-headedness.     Physical Exam Updated Vital Signs BP (!) 151/98    Pulse 93    Temp 98.4 F (36.9 C) (Oral)    Resp (!) 26    SpO2 92%   Physical Exam Vitals signs and nursing note reviewed.  Constitutional:      General: She is not in acute distress.    Appearance: Normal appearance. She is well-developed and normal weight. She is not ill-appearing or diaphoretic.  HENT:     Head: Normocephalic and atraumatic.     Mouth/Throat:     Mouth: Mucous membranes are moist.     Pharynx:  Oropharynx is clear.     Comments: No food noted within the posterior oropharynx, tolerating secretions without difficulty, normal phonation. Eyes:     General:        Right eye: No discharge.        Left eye: No discharge.     Pupils: Pupils are equal, round, and reactive to light.  Neck:     Musculoskeletal: Neck supple.     Comments: Breathing comfortably, no stridor. Cardiovascular:     Rate and Rhythm: Normal rate and regular rhythm.     Heart sounds: Normal heart sounds. No murmur. No friction rub. No gallop.   Pulmonary:     Effort: Pulmonary effort is normal.  No respiratory distress.     Breath sounds: Normal breath sounds. No wheezing or rales.     Comments: Respirations equal and unlabored on 2L [place], when on room air desats to the 80s, patient able to speak in full sentences, lungs clear to auscultation bilaterally without focal lung sounds  Abdominal:     General: Bowel sounds are normal. There is no distension.     Palpations: Abdomen is soft. There is no mass.     Tenderness: There is no abdominal tenderness. There is no guarding.     Comments: Abdomen soft, nondistended, nontender to palpation in all quadrants without guarding or peritoneal signs  Musculoskeletal:        General: No deformity.  Skin:    General: Skin is warm and dry.     Capillary Refill: Capillary refill takes less than 2 seconds.  Neurological:     Mental Status: She is alert.     Coordination: Coordination normal.     Comments: Speech is clear, able to follow commands Moves extremities without ataxia, coordination intact   Psychiatric:        Mood and Affect: Mood normal.        Behavior: Behavior normal.      ED Treatments / Results  Labs (all labs ordered are listed, but only abnormal results are displayed) Labs Reviewed  BASIC METABOLIC PANEL - Abnormal; Notable for the following components:      Result Value   CO2 21 (*)    Glucose, Bld 192 (*)    Creatinine, Ser 1.06 (*)     Calcium 8.6 (*)    GFR calc non Af Amer 52 (*)    GFR calc Af Amer 60 (*)    All other components within normal limits  CBC WITH DIFFERENTIAL/PLATELET - Abnormal; Notable for the following components:   Abs Immature Granulocytes 0.11 (*)    All other components within normal limits  SARS CORONAVIRUS 2  URINALYSIS, ROUTINE W REFLEX MICROSCOPIC    EKG EKG Interpretation  Date/Time:  Thursday September 05 2018 18:31:09 EDT Ventricular Rate:  91 PR Interval:    QRS Duration: 97 QT Interval:  334 QTC Calculation: 411 R Axis:   -16 Text Interpretation:  Sinus rhythm Atrial premature complexes Probable LVH with secondary repol abnrm Confirmed by Jacalyn LefevreHaviland, Julie [phone]([id number]) on 09/05/2018 6:34:11 PM   Radiology Ct Chest W Contrast  Result Date: 09/05/2018 CLINICAL DATA:  Chest pain.  Patient choked on a piece of chicken. EXAM: CT CHEST WITH CONTRAST TECHNIQUE: Multidetector CT imaging of the chest was performed during intravenous contrast administration. CONTRAST:  75mL OMNIPAQUE IOHEXOL 300 MG/ML  SOLN COMPARISON:  04/08/2018 FINDINGS: Cardiovascular: The heart size is normal. No substantial pericardial effusion. Coronary artery calcification is evident. Atherosclerotic calcification is noted in the wall of the thoracic aorta. Mediastinum/Nodes: 1.8 x 2.5 cm calcified nodule in the left thoracic inlet is stable since the prior study and comparing back to a scan from 04/25/2017. No mediastinal lymphadenopathy. There is no hilar lymphadenopathy. The esophagus has normal imaging features. There is no axillary lymphadenopathy. Lungs/Pleura: 5 mm posterior right apical nodule (23/4) stable since 04/25/2017, consistent with benign etiology. 4 mm posterior right upper lobe nodule is also stable. 5 mm right middle lobe nodule has progressed since 04/08/2018 and is new since 04/25/2017. Other tiny bilateral pulmonary nodules are stable. No pulmonary edema. No pleural effusion. Probable atelectasis in the dependent  lung bases. Upper Abdomen: Unremarkable. Musculoskeletal: No worrisome lytic or sclerotic  osseous abnormality. IMPRESSION: 1. No acute findings. 2. Scattered tiny bilateral pulmonary nodules. One of these has been progressive since 04/25/2017, now measuring up to 5 mm in the right middle lobe. Consider follow-up CT in 6-12 months to ensure stability. 3.  Aortic Atherosclerois (ICD10-170.0) Electronically Signed   By: Kennith CenterEric  Mansell M.D.   On: 09/05/2018 20:22   Dg Chest Portable 1 View  Result Date: 09/05/2018 CLINICAL DATA:  Shortness of breath EXAM: PORTABLE CHEST 1 VIEW COMPARISON:  08/12/2018, 04/11/2018 FINDINGS: Left-sided pacing device as before. Chronic elevation of right diaphragm. No acute opacity. Stable cardiomediastinal silhouette with aortic atherosclerosis. IMPRESSION: No active disease. Electronically Signed   By: Jasmine PangKim  Fujinaga M.D.   On: 09/05/2018 19:30    Procedures Procedures (including critical care time)  Medications Ordered in ED Medications  ondansetron (ZOFRAN) injection 4 mg (4 mg Intravenous Given 09/05/18 1922)  iohexol (OMNIPAQUE) 300 MG/ML solution 75 mL (75 mLs Intravenous Contrast Given 09/05/18 1954)     Initial Impression / Assessment and Plan / ED Course  I have reviewed the triage vital signs and the nursing notes.  Pertinent labs & imaging results that were available during my care of the patient were reviewed by me and considered in my medical decision making (see chart for details).  Presents after choking episode where EMS removed chicken from her throat, when EMS arrived on the scene she was hypoxic and unresponsive, initially requiring nonrebreather now on nasal cannula but still requiring supplemental oxygen.  She is not stridorous there is no evidence of additional food particles within the posterior oropharynx and the lungs are clear.  Will get chest x-ray, EKG and basic labs.  If patient continues to require oxygen she will need CT to assess for any  foreign body within the lung that could require bronchoscopy.   Labs show no leukocytosis, normal hemoglobin, creatinine of 1.06, glucose 192.  No other acute electrolyte treatments.  Urinalysis pending.  Coronavirus test negative.  Chest x-ray is unremarkable but patient persistently requires oxygen will proceed with CT contrast of the chest  Chest CT shows multiple pulmonary nodules with the previously noted, no obvious foreign body or signs of pneumonitis or aspiration.  Patient initially has improved and is no longer requiring oxygen while at rest but when she got up to ambulate she desatted to 73.  We will plan to consult hospitalist for admission and observation overnight if patient continues to have desaturations or requires oxygen and may require bronchoscopy.  I do think the patient would benefit from a formal swallow eval and possible EGD given multiple choking episodes, I certainly think the patient's Parkinson's will likely contribute to this.  Case discussed with Dr. Mikeal HawthorneGarba with [facility] hospitalist who will see and admit the patient.  Final Clinical Impressions(s) / ED Diagnoses   Final diagnoses:  Choking episode  Hypoxia    ED Discharge Orders    None       Legrand RamsFord, Starlynn Klinkner N, PA-C 09/07/18 1231    Jacalyn LefevreHaviland, Julie, MD 09/11/18 0003

## 2018-09-05 NOTE — ED Notes (Signed)
Patient stood up with assistance of RN and tech. She took one step and oxygen saturation dropped to 73%. Currently on room air and at 96%.

## 2018-09-05 NOTE — H&P (Signed)
History and Physical   Jessica HumphreyCarolyn A Hayes ZOX:[id number] DOB: [birth year] DOA: 09/05/2018  Referring MD/NP/PA: Dr. Particia NearingHaviland  PCP: [name], Stephen D., MD   Outpatient Specialists: None  Patient coming from: Skilled nursing facility  Chief Complaint: Choking on food  HPI: Jessica HumphreyCarolyn A Jessica Hayes is a 73 y.o. female with medical history significant of Parkinson's disease, Alzheimer's dementia, bipolar disorder, GERD, hypertension, hypothyroidism who was brought in from skilled facility after choking while eating.  Patient apparently has had this before but today it was significant.  She was turning blue.  Heimlich maneuver was tried.  A piece of chicken was removed from her throat.  Patient was brought to the ER where she was initially evaluated.  Appears to have gotten the chicken removed and no other food infected.  She was getting up however became hypoxic with oxygen sat dropping into the 70s.  She is currently on oxygen at 2 L and oxygen sats in the 90s.  Due to this episode she is being admitted for further work-up.  Patient reported previous EGD with dilatation many years ago but nothing recent.  She has noted difficulties with solid foods for a little while now.  She denied any significant chest pain right now.  No other GI symptoms.  Patient will be admitted to evaluate possible aspiration pneumonia but also evaluation for severe dysphagia possibly with GI intervention..  ED Course: Temperature is 98.4 with blood pressure 150/98.  Pulse 93 respiratory 26 oxygen sat currently 92% on 2 L.  She has a white count of 7.2 hemoglobin 14.3 platelets 175.  Sodium 136 potassium 4.3 chloride 104 CO2 21 BUN 18 creatinine 1.06 and calcium 8.6.  Glucose is 192.  Urinalysis so far negative.  Chest x-ray showed no active disease.  CT angiogram of the chest showed no acute findings with scattered tiny bilateral pulmonary nodules.  Aortic atherosclerosis.  Patient is being admitted again due to sudden hypoxia.  Review of  Systems: As per HPI otherwise 10 point review of systems negative.    Past Medical History:  Diagnosis Date   Alzheimer disease (HCC)    Anxiety    Bipolar disorder, unspecified (HCC)    Dementia (HCC)    Depressive disorder    GERD (gastroesophageal reflux disease)    Hypercholesteremia    Hypertension    Insomnia    Lumbar disc disease    degenerative   Parkinson disease (HCC)    Restless leg syndrome    Thyroid disease    hypothyroid   UTI (urinary tract infection)    Weakness     Past Surgical History:  Procedure Laterality Date   PACEMAKER IMPLANT N/A 04/10/2018   Procedure: PACEMAKER IMPLANT;  Surgeon: Regan Lemmingamnitz, Will Martin, MD;  Location: [facility];  Service: Cardiovascular;  Laterality: N/A;     has an unknown smoking status. She has never used smokeless tobacco. She reports previous alcohol use. She reports previous drug use.  Allergies  Allergen Reactions   Demerol [Meperidine Hcl] Other (See Comments)    [facility]    Family History  Problem Relation Age of Onset   Diabetes Mother    Heart disease Mother    Heart disease Father      Prior to Admission medications   Medication Sig Start Date End Date Taking? Authorizing Provider  aspirin EC 81 MG tablet Take 81 mg by mouth daily.    [provider]  carbidopa-levodopa (SINEMET IR) 25-100 MG tablet Take 1  tablet by mouth 3 (three) times daily.    [provider]  carvedilol (COREG) 6.25 MG tablet Take 1 tablet (6.25 mg total) by mouth 2 (two) times daily with a meal. 04/11/18   Gherghe, Daylene Katayamaostin M, MD  cyclobenzaprine (FLEXERIL) 5 MG tablet Take 5 mg by mouth at bedtime.    [provider]  fenofibrate (TRICOR) 48 MG tablet Take 48 mg by mouth daily.    [provider]  furosemide (LASIX) 20 MG tablet Take 1 tablet (20 mg total) by mouth as needed for edema. Patient taking differently: Take 20 mg by mouth daily as needed for  edema.  03/31/18   Roberto ScalesNettey, Shayla D, MD  LACTOBACILLUS PO Take 1 capsule by mouth daily.    [provider]  levothyroxine (SYNTHROID, LEVOTHROID) 50 MCG tablet Take 50 mcg by mouth daily before breakfast.    [provider]  lidocaine (LIDODERM) 5 % Place 1 patch onto the skin daily. Remove & Discard patch within 12 hours or as directed by MD 08/12/18   Michela PitcherFawze, Mina A, PA-C  OLANZapine (ZYPREXA) 15 MG tablet Take 15 mg by mouth at bedtime.    [provider]  oxybutynin (DITROPAN-XL) 10 MG 24 hr tablet Take 10 mg by mouth at bedtime.    [provider]  pantoprazole (PROTONIX) 40 MG tablet Take 1 tablet (40 mg total) by mouth daily. 04/10/18   Almon HerculesGonfa, Taye T, MD  rOPINIRole (REQUIP) 0.5 MG tablet Take 0.5 mg by mouth at bedtime.    [provider]  temazepam (RESTORIL) 7.5 MG capsule Take 1 capsule (7.5 mg total) by mouth at bedtime. 04/11/18   Leatha GildingGherghe, Costin M, MD    Physical Exam: Vitals:   09/05/18 2200 09/05/18 2215 09/05/18 2230 09/05/18 2300  BP: 130/60 (!) 143/73 131/71 119/60  Pulse: 71 72 73 67  Resp: 19 19 17 15   Temp:      TempSrc:      SpO2: 96% 95% 96% 100%      Constitutional: NAD, chronically ill looking and frail Vitals:   09/05/18 2200 09/05/18 2215 09/05/18 2230 09/05/18 2300  BP: 130/60 (!) 143/73 131/71 119/60  Pulse: 71 72 73 67  Resp: 19 19 17 15   Temp:      TempSrc:      SpO2: 96% 95% 96% 100%   Eyes: PERRL, lids and conjunctivae normal ENMT: Mucous membranes are dry. Posterior pharynx hyperemic but clear of any exudate or lesions.Normal dentition.  Neck: normal, supple, no masses, no thyromegaly Respiratory: Coarse breath sounds, clear to auscultation bilaterally, no wheezing, no crackles. Normal respiratory effort. No accessory muscle use.  Cardiovascular: Tachycardic, no murmurs / rubs / gallops. No extremity edema. 2+ pedal pulses. No carotid bruits.  Abdomen: no tenderness, no masses palpated. No  hepatosplenomegaly. Bowel sounds positive.  Musculoskeletal: no clubbing / cyanosis. No joint deformity upper and lower extremities. Good ROM, no contractures. Normal muscle tone.  Skin: no rashes, lesions, ulcers. No induration Neurologic: CN 2-12 grossly intact. Sensation intact, DTR normal. Strength 5/5 in all 4.  Rest tremors Psychiatric: Normal judgment and insight. Alert and oriented x 3. Normal mood.     Labs on Admission: I have personally reviewed following labs and imaging studies  CBC: Recent Labs  Lab 09/05/18 1843  WBC 7.2  NEUTROABS 4.0  HGB 14.3  HCT 44.3  MCV 89.9  PLT 175   Basic Metabolic Panel: Recent Labs  Lab 09/05/18 1843  NA 136  K 4.6  CL 105  CO2 21*  GLUCOSE 192*  BUN 18  CREATININE 1.06*  CALCIUM 8.6*   GFR: CrCl cannot be calculated (Unknown ideal weight.). Liver Function Tests: No results for input(s): AST, ALT, ALKPHOS, BILITOT, PROT, ALBUMIN in the last 168 hours. No results for input(s): LIPASE, AMYLASE in the last 168 hours. No results for input(s): AMMONIA in the last 168 hours. Coagulation Profile: No results for input(s): INR, PROTIME in the last 168 hours. Cardiac Enzymes: No results for input(s): CKTOTAL, CKMB, CKMBINDEX, TROPONINI in the last 168 hours. BNP (last 3 results) No results for input(s): PROBNP in the last 8760 hours. HbA1C: No results for input(s): HGBA1C in the last 72 hours. CBG: No results for input(s): GLUCAP in the last 168 hours. Lipid Profile: No results for input(s): CHOL, HDL, LDLCALC, TRIG, CHOLHDL, LDLDIRECT in the last 72 hours. Thyroid Function Tests: No results for input(s): TSH, T4TOTAL, FREET4, T3FREE, THYROIDAB in the last 72 hours. Anemia Panel: No results for input(s): VITAMINB12, FOLATE, FERRITIN, TIBC, IRON, RETICCTPCT in the last 72 hours. Urine analysis:    Component Value Date/Time   COLORURINE YELLOW 09/05/2018 2301   APPEARANCEUR HAZY (A) 09/05/2018 2301   LABSPEC >1.046 (H)  09/05/2018 2301   PHURINE 6.0 09/05/2018 2301   GLUCOSEU NEGATIVE 09/05/2018 2301   HGBUR NEGATIVE 09/05/2018 2301   BILIRUBINUR NEGATIVE 09/05/2018 2301   KETONESUR NEGATIVE 09/05/2018 2301   PROTEINUR NEGATIVE 09/05/2018 2301   NITRITE POSITIVE (A) 09/05/2018 2301   LEUKOCYTESUR LARGE (A) 09/05/2018 2301   Sepsis Labs: @LABRCNTIP (procalcitonin:4,lacticidven:4) ) Recent Results (from the past 240 hour(s))  SARS Coronavirus 2     Status: None   Collection Time: 09/05/18  6:48 PM  Result Value Ref Range Status   SARS Coronavirus 2 NOT DETECTED NOT DETECTED Final    Comment: (NOTE) SARS-CoV-2 target nucleic acids are NOT DETECTED. The SARS-CoV-2 RNA is generally detectable in upper and lower respiratory specimens during the acute phase of infection.  Negative  results do not preclude SARS-CoV-2 infection, do not rule out co-infections with other pathogens, and should not be used as the sole basis for treatment or other patient management decisions.  Negative results must be combined with clinical observations, patient history, and epidemiological information. The expected result is Not Detected. Fact Sheet for Patients: http://www.biofiredefense.com/wp-content/uploads/2020/03/BIOFIRE-COVID -19-patients.pdf Fact Sheet for Healthcare Providers: http://www.biofiredefense.com/wp-content/uploads/2020/03/BIOFIRE-COVID -19-hcp.pdf This test is not yet approved or cleared by the [place] FDA and  has been authorized for detection and/or diagnosis of SARS-CoV-2 by FDA under an Emergency Use Authorization (EUA).  This EUA will remain in effec t (meaning this test can be used) for the duration of  the COVID-19 declaration under Section 564(b)(1) of the Act, 21 U.S.C. section 360bbb-3(b)(1), unless the authorization is terminated or revoked sooner. Performed at [facility], [address] [place], [place] [address]      Radiological Exams on Admission: Ct Chest W  Contrast  Result Date: 09/05/2018 CLINICAL DATA:  Chest pain.  Patient choked on a piece of chicken. EXAM: CT CHEST WITH CONTRAST TECHNIQUE: Multidetector CT imaging of the chest was performed during intravenous contrast administration. CONTRAST:  75mL OMNIPAQUE IOHEXOL 300 MG/ML  SOLN COMPARISON:  04/08/2018 FINDINGS: Cardiovascular: The heart size is normal. No substantial pericardial effusion. Coronary artery calcification is evident. Atherosclerotic calcification is noted in the wall of the thoracic aorta. Mediastinum/Nodes: 1.8 x 2.5 cm calcified nodule in the left thoracic inlet is stable since the prior study and comparing back to a scan  from 04/25/2017. No mediastinal lymphadenopathy. There is no hilar lymphadenopathy. The esophagus has normal imaging features. There is no axillary lymphadenopathy. Lungs/Pleura: 5 mm posterior right apical nodule (23/4) stable since 04/25/2017, consistent with benign etiology. 4 mm posterior right upper lobe nodule is also stable. 5 mm right middle lobe nodule has progressed since 04/08/2018 and is new since 04/25/2017. Other tiny bilateral pulmonary nodules are stable. No pulmonary edema. No pleural effusion. Probable atelectasis in the dependent lung bases. Upper Abdomen: Unremarkable. Musculoskeletal: No worrisome lytic or sclerotic osseous abnormality. IMPRESSION: 1. No acute findings. 2. Scattered tiny bilateral pulmonary nodules. One of these has been progressive since 04/25/2017, now measuring up to 5 mm in the right middle lobe. Consider follow-up CT in 6-12 months to ensure stability. 3.  Aortic Atherosclerois (ICD10-170.0) Electronically Signed   By: Kennith Center M.D.   On: 09/05/2018 20:22   Dg Chest Portable 1 View  Result Date: 09/05/2018 CLINICAL DATA:  Shortness of breath EXAM: PORTABLE CHEST 1 VIEW COMPARISON:  08/12/2018, 04/11/2018 FINDINGS: Left-sided pacing device as before. Chronic elevation of right diaphragm. No acute opacity. Stable  cardiomediastinal silhouette with aortic atherosclerosis. IMPRESSION: No active disease. Electronically Signed   By: Jasmine Pang M.D.   On: 09/05/2018 19:30    Assessment/Plan Principal Problem:   Food impaction of esophagus Active Problems:   Dehydration   Alzheimer's dementia (HCC)   Essential hypertension   Hyperlipidemia   Hypothyroidism   Persistent atrial fibrillation     #1 Food impaction: This has resolved.  Appears patient has chronic dysphagia.  We will admit patient therefore for evaluation.  Patient will require GI consultation For possible EGD with dilatation.  #2 dysphagia: As per above.  #3 possible UTI: Patient has not complained of urinary symptoms but clearly has positive nitrite leukocyte Estrace WBCs and bacteria.  I will empirically initiate Rocephin.  Get urine cultures.  #4 Parkinson's disease: Resume home regimen as soon as is safe.  #5 Alzheimer's dementia: Appears to be at her baseline.  No confusion at this point.  #6 hypothyroidism: Continue with levothyroxine.  #7 history of atrial fibrillation: Patient appears to be in sinus rhythm now but rate is controlled.   DVT prophylaxis: Lovenox Code Status: DNR Family Communication: No family at bedside Disposition Plan: Back to skilled facility Consults called: Please consult GI in the morning Admission status: Inpatient  Severity of Illness: The appropriate patient status for this patient is INPATIENT. Inpatient status is judged to be reasonable and necessary in order to provide the required intensity of service to ensure the patient's safety. The patient's presenting symptoms, physical exam findings, and initial radiographic and laboratory data in the context of their chronic comorbidities is felt to place them at high risk for further clinical deterioration. Furthermore, it is not anticipated that the patient will be medically stable for discharge from the hospital within 2 midnights of admission. The  following factors support the patient status of inpatient.   " The patient's presenting symptoms include food impaction. " The worrisome physical exam findings include hypoxia with tremors. " The initial radiographic and laboratory data are worrisome because of no significant findings on lab. " The chronic co-morbidities include Parkinson's disease and dementia.   * I certify that at the point of admission it is my clinical judgment that the patient will require inpatient hospital care spanning beyond 2 midnights from the point of admission due to high intensity of service, high risk for further deterioration and high frequency of  surveillance required.Barbette Merino MD [facility] Pager [phone]  If 7PM-7AM, please contact night-coverage [email or web address] Password [facility]  09/05/2018, 11:54 PM

## 2018-09-05 NOTE — ED Triage Notes (Signed)
Pt arrives via EMS from [place] with reports of pt choking. EMS was able to get a large piece of chicken out of pts throat. Pt switched from [facility] to [place]. Pt endorsing some CP.

## 2018-09-06 ENCOUNTER — Inpatient Hospital Stay (HOSPITAL_COMMUNITY): Payer: Medicare HMO | Admitting: Certified Registered"

## 2018-09-06 ENCOUNTER — Encounter (HOSPITAL_COMMUNITY)
Admission: EM | Disposition: A | Discharge status: Skilled Nursing Facility | Payer: Medicare HMO | Source: Skilled Nursing Facility | Attending: Internal Medicine

## 2018-09-06 ENCOUNTER — Encounter (HOSPITAL_COMMUNITY): Payer: Self-pay

## 2018-09-06 HISTORY — PX: ESOPHAGOGASTRODUODENOSCOPY (EGD) WITH PROPOFOL: SHX5813

## 2018-09-06 HISTORY — PX: BIOPSY: SHX5522

## 2018-09-06 LAB — CBC
HCT: 44.2 % (ref 36.0–46.0)
Hemoglobin: 14.5 g/dL (ref 12.0–15.0)
MCH: 28.9 pg (ref 26.0–34.0)
MCHC: 32.8 g/dL (ref 30.0–36.0)
MCV: 88.2 fL (ref 80.0–100.0)
Platelets: 127 10*3/uL — ABNORMAL LOW (ref 150–400)
RBC: 5.01 MIL/uL (ref 3.87–5.11)
RDW: 13 % (ref 11.5–15.5)
WBC: 8.9 10*3/uL (ref 4.0–10.5)
nRBC: 0 % (ref 0.0–0.2)

## 2018-09-06 LAB — COMPREHENSIVE METABOLIC PANEL
ALT: 31 U/L (ref 0–44)
AST: 37 U/L (ref 15–41)
Albumin: 3.3 g/dL — ABNORMAL LOW (ref 3.5–5.0)
Alkaline Phosphatase: 151 U/L — ABNORMAL HIGH (ref 38–126)
Anion gap: 8 (ref 5–15)
BUN: 16 mg/dL (ref 8–23)
CO2: 23 mmol/L (ref 22–32)
Calcium: 8.9 mg/dL (ref 8.9–10.3)
Chloride: 107 mmol/L (ref 98–111)
Creatinine, Ser: 0.91 mg/dL (ref 0.44–1.00)
GFR calc Af Amer: >60 mL/min (ref >60)
GFR calc non Af Amer: >60 mL/min (ref >60)
Glucose, Bld: 117 mg/dL — ABNORMAL HIGH (ref 70–99)
Potassium: 4.7 mmol/L (ref 3.5–5.1)
Sodium: 138 mmol/L (ref 135–145)
Total Bilirubin: 0.9 mg/dL (ref 0.3–1.2)
Total Protein: 6.3 g/dL — ABNORMAL LOW (ref 6.5–8.1)

## 2018-09-06 LAB — MRSA PCR SCREENING: MRSA by PCR: NEGATIVE

## 2018-09-06 SURGERY — ESOPHAGOGASTRODUODENOSCOPY (EGD) WITH PROPOFOL
Anesthesia: Monitor Anesthesia Care

## 2018-09-06 MED ORDER — NITROGLYCERIN 0.4 MG SL SUBL: 0.4000 mg | SUBLINGUAL_TABLET | SUBLINGUAL | Status: DC | PRN

## 2018-09-06 MED ORDER — CARBIDOPA-LEVODOPA 25-100 MG PO TABS
1.0000 | ORAL_TABLET | Freq: Three times a day (TID) | ORAL | Status: DC
  Administered 2018-09-06 – 2018-09-09 (×9): 1 via ORAL
  Filled 2018-09-06 (×9): qty 1

## 2018-09-06 MED ORDER — SODIUM CHLORIDE 0.9 % IV SOLN
1.0000 g | INTRAVENOUS | Status: DC
  Administered 2018-09-06 – 2018-09-09 (×4): 1 g via INTRAVENOUS
  Filled 2018-09-06 (×3): qty 1
  Filled 2018-09-06 (×2): qty 10

## 2018-09-06 MED ORDER — TEMAZEPAM 7.5 MG PO CAPS
7.5000 mg | ORAL_CAPSULE | Freq: Every day | ORAL | Status: DC
  Administered 2018-09-06 – 2018-09-08 (×3): 7.5 mg via ORAL
  Filled 2018-09-06 (×3): qty 1

## 2018-09-06 MED ORDER — OLANZAPINE 5 MG PO TABS
15.0000 mg | ORAL_TABLET | Freq: Every day | ORAL | Status: DC
  Administered 2018-09-06 – 2018-09-08 (×3): 15 mg via ORAL
  Filled 2018-09-06 (×3): qty 3

## 2018-09-06 MED ORDER — ACETAMINOPHEN ER 650 MG PO TBCR: 1300.0000 mg | EXTENDED_RELEASE_TABLET | ORAL | Status: DC | PRN

## 2018-09-06 MED ORDER — SODIUM CHLORIDE 0.9 % IV SOLN: INTRAVENOUS | Status: DC

## 2018-09-06 MED ORDER — ESCITALOPRAM OXALATE 10 MG PO TABS
15.0000 mg | ORAL_TABLET | Freq: Every day | ORAL | Status: DC
  Administered 2018-09-06 – 2018-09-09 (×4): 15 mg via ORAL
  Filled 2018-09-06 (×4): qty 2

## 2018-09-06 MED ORDER — PROPOFOL 10 MG/ML IV BOLUS
INTRAVENOUS | Status: DC | PRN
  Administered 2018-09-06: 20 mg via INTRAVENOUS

## 2018-09-06 MED ORDER — CARVEDILOL 6.25 MG PO TABS
6.2500 mg | ORAL_TABLET | Freq: Two times a day (BID) | ORAL | Status: DC
  Administered 2018-09-06 – 2018-09-09 (×6): 6.25 mg via ORAL
  Filled 2018-09-06 (×6): qty 1

## 2018-09-06 MED ORDER — LIDOCAINE 5 % EX PTCH: 1.0000 | MEDICATED_PATCH | Freq: Every day | CUTANEOUS | Status: DC | PRN

## 2018-09-06 MED ORDER — BACLOFEN 10 MG PO TABS
10.0000 mg | ORAL_TABLET | Freq: Every day | ORAL | Status: DC
  Administered 2018-09-06 – 2018-09-08 (×3): 10 mg via ORAL
  Filled 2018-09-06 (×3): qty 1

## 2018-09-06 MED ORDER — SACCHAROMYCES BOULARDII 250 MG PO CAPS
250.0000 mg | ORAL_CAPSULE | Freq: Every day | ORAL | Status: DC
  Administered 2018-09-06 – 2018-09-09 (×4): 250 mg via ORAL
  Filled 2018-09-06 (×4): qty 1

## 2018-09-06 MED ORDER — DEXTROSE-NACL 5-0.9 % IV SOLN
INTRAVENOUS | Status: DC
  Administered 2018-09-06 – 2018-09-09 (×7): via INTRAVENOUS

## 2018-09-06 MED ORDER — ORAL CARE MOUTH RINSE
15.0000 mL | Freq: Two times a day (BID) | OROMUCOSAL | Status: DC
  Administered 2018-09-06 (×2): 15 mL via OROMUCOSAL

## 2018-09-06 MED ORDER — LACTATED RINGERS IV SOLN
INTRAVENOUS | Status: DC
  Administered 2018-09-06: 13:00:00 via INTRAVENOUS

## 2018-09-06 MED ORDER — CHLORHEXIDINE GLUCONATE 0.12 % MT SOLN
15.0000 mL | Freq: Two times a day (BID) | OROMUCOSAL | Status: DC
  Administered 2018-09-06 – 2018-09-09 (×6): 15 mL via OROMUCOSAL
  Filled 2018-09-06 (×5): qty 15

## 2018-09-06 MED ORDER — ONDANSETRON HCL 4 MG/2ML IJ SOLN: 4.0000 mg | Freq: Four times a day (QID) | INTRAMUSCULAR | Status: DC | PRN

## 2018-09-06 MED ORDER — PROPOFOL 500 MG/50ML IV EMUL
INTRAVENOUS | Status: DC | PRN
  Administered 2018-09-06: 75 ug/kg/min via INTRAVENOUS

## 2018-09-06 MED ORDER — ROPINIROLE HCL 0.5 MG PO TABS
0.5000 mg | ORAL_TABLET | Freq: Every day | ORAL | Status: DC
  Administered 2018-09-06 – 2018-09-08 (×3): 0.5 mg via ORAL
  Filled 2018-09-06 (×3): qty 1

## 2018-09-06 MED ORDER — HEPARIN SODIUM (PORCINE) 5000 UNIT/ML IJ SOLN
5000.0000 [IU] | Freq: Three times a day (TID) | INTRAMUSCULAR | Status: DC
  Administered 2018-09-06 – 2018-09-09 (×9): 5000 [IU] via SUBCUTANEOUS
  Filled 2018-09-06 (×9): qty 1

## 2018-09-06 MED ORDER — LEVOTHYROXINE SODIUM 50 MCG PO TABS
50.0000 ug | ORAL_TABLET | Freq: Every day | ORAL | Status: DC
  Administered 2018-09-07 – 2018-09-09 (×3): 50 ug via ORAL
  Filled 2018-09-06 (×3): qty 1

## 2018-09-06 MED ORDER — PANTOPRAZOLE SODIUM 40 MG PO TBEC
40.0000 mg | DELAYED_RELEASE_TABLET | Freq: Every day | ORAL | Status: DC
  Administered 2018-09-06 – 2018-09-09 (×4): 40 mg via ORAL
  Filled 2018-09-06 (×4): qty 1

## 2018-09-06 MED ORDER — FUROSEMIDE 20 MG PO TABS: 20.0000 mg | ORAL_TABLET | Freq: Every day | ORAL | Status: DC | PRN

## 2018-09-06 MED ORDER — ACETAMINOPHEN 325 MG PO TABS
1300.0000 mg | ORAL_TABLET | ORAL | Status: DC | PRN
  Administered 2018-09-06 – 2018-09-08 (×5): 1300 mg via ORAL
  Filled 2018-09-06 (×5): qty 4

## 2018-09-06 MED ORDER — FENOFIBRATE 54 MG PO TABS
54.0000 mg | ORAL_TABLET | Freq: Every day | ORAL | Status: DC
  Administered 2018-09-06 – 2018-09-09 (×4): 54 mg via ORAL
  Filled 2018-09-06 (×4): qty 1

## 2018-09-06 MED ORDER — OXYBUTYNIN CHLORIDE ER 10 MG PO TB24
10.0000 mg | ORAL_TABLET | Freq: Every day | ORAL | Status: DC
  Administered 2018-09-06 – 2018-09-08 (×3): 10 mg via ORAL
  Filled 2018-09-06 (×3): qty 1

## 2018-09-06 MED ORDER — ASPIRIN EC 81 MG PO TBEC
81.0000 mg | DELAYED_RELEASE_TABLET | Freq: Every day | ORAL | Status: DC
  Administered 2018-09-06 – 2018-09-09 (×4): 81 mg via ORAL
  Filled 2018-09-06 (×4): qty 1

## 2018-09-06 MED ORDER — LIDOCAINE 2% (20 MG/ML) 5 ML SYRINGE
INTRAMUSCULAR | Status: DC | PRN
  Administered 2018-09-06: 40 mg via INTRAVENOUS

## 2018-09-06 MED ORDER — LACTOBACILLUS PO TABS: ORAL_TABLET | Freq: Every day | ORAL | Status: DC

## 2018-09-06 MED ORDER — ONDANSETRON HCL 4 MG PO TABS: 4.0000 mg | ORAL_TABLET | Freq: Four times a day (QID) | ORAL | Status: DC | PRN

## 2018-09-06 SURGICAL SUPPLY — 14 items

## 2018-09-06 NOTE — Anesthesia Preprocedure Evaluation (Addendum)
Anesthesia Evaluation  Patient identified by MRN, date of birth, ID band Patient awake    Reviewed: Allergy & Precautions, NPO status , Unable to perform ROS - Chart review only  Airway Mallampati: II       Dental no notable dental hx.    Pulmonary    Pulmonary exam normal breath sounds clear to auscultation       Cardiovascular hypertension, Pt. on home beta blockers Normal cardiovascular exam Rhythm:Regular Rate:Normal     Neuro/Psych PSYCHIATRIC DISORDERS Anxiety Depression Bipolar Disorder Dementia    GI/Hepatic Neg liver ROS, GERD  Medicated,  Endo/Other  Hypothyroidism   Renal/GU negative Renal ROS     Musculoskeletal   Abdominal Normal abdominal exam  (+)   Peds  Hematology negative hematology ROS (+)   Anesthesia Other Findings Normal remote reviewed.  AF burden <1%, longest episode 6 min  1 VHR, EGM shows short burst of AF/RVR   Next remote 91 days- JBox, RN/CVRS Result Report   - Procedure narrative: Transthoracic echocardiography. Image   quality was poor. The study was technically difficult, as a   result of poor sound wave transmission. - Left ventricle: The cavity size was normal. Wall thickness was   increased in a pattern of mild LVH. Systolic function was normal.   The estimated ejection fraction was in the range of 60% to 65%.   Wall motion was normal; there were no regional wall motion   abnormalities. Doppler parameters are consistent with abnormal   left ventricular relaxation (grade 1 diastolic dysfunction). The   E/e&' ratio is between 8-15, suggesting indeterminate LV filling   pressure. - Mitral valve: Mildly thickened leaflets . There was trivial   regurgitation. - Left atrium: The atrium was normal in size. - Tricuspid valve: There was trivial regurgitation. - Pulmonary arteries: PA peak pressure: 22 mm Hg (S). - Inferior vena cava: The vessel was normal in size. The  respirophasic diameter changes were in the normal range (>= 50%),   consistent with normal central venous pressure.   Reproductive/Obstetrics                            Anesthesia Physical Anesthesia Plan  ASA: III  Anesthesia Plan: MAC   Post-op Pain Management:    Induction:   PONV Risk Score and Plan:   Airway Management Planned: Nasal Cannula, Natural Airway and Simple Face Mask  Additional Equipment:   Intra-op Plan:   Post-operative Plan:   Informed Consent: I have reviewed the patients History and Physical, chart, labs and discussed the procedure including the risks, benefits and alternatives for the proposed anesthesia with the patient or authorized representative who has indicated his/her understanding and acceptance.     Dental advisory given  Plan Discussed with: CRNA  Anesthesia Plan Comments:         Anesthesia Quick Evaluation

## 2018-09-06 NOTE — ED Notes (Signed)
Report attempted, RN to call back. 

## 2018-09-06 NOTE — Transfer of Care (Signed)
Immediate Anesthesia Transfer of Care Note  Patient: Jessica Hayes  Procedure(s) Performed: ESOPHAGOGASTRODUODENOSCOPY (EGD) WITH PROPOFOL (N/A ) BIOPSY  Patient Location: Endoscopy Unit  Anesthesia Type:MAC  Level of Consciousness: drowsy  Airway & Oxygen Therapy: Patient Spontanous Breathing and Patient connected to nasal cannula oxygen  Post-op Assessment: Report given to RN  Post vital signs: Reviewed and stable  Last Vitals:  Vitals Value Taken Time  BP    Temp    Pulse    Resp    SpO2      Last Pain:  Vitals:   09/06/18 1241  TempSrc: Other (Comment)  PainSc: 7          Complications: No apparent anesthesia complications

## 2018-09-06 NOTE — TOC Initial Note (Signed)
Transition of Care [facility]) - Initial/Assessment Note    Patient Details  Name: Jessica Hayes MRN: [id number] Date of Birth: [birth year]  Transition of [facility] CM/SW Contact:    Alexander Mt, [place] Phone Number: 09/06/2018, 5:33 PM  Clinical Narrative:                 Pt is LTC resident at [facility], spoke with pt daughter confirmed that plan is for return to SNF at discharge.   Pt second COVID requested to be taken prior to dc by facility.  Will follow and support dc when medically stable.  Expected Discharge Plan: Skilled Nursing Facility Barriers to Discharge: Continued Medical Work up   Patient Goals and CMS Choice Patient states their goals for this hospitalization and ongoing recovery are:: to return to SNF CMS Medicare.gov Compare Post Acute Care list provided to:: Patient Choice offered to / list presented to : Adult Children, Patient  Expected Discharge Plan and Services Expected Discharge Plan: [place] In-house Referral: Clinical Social Work   Post Acute Care Choice: [place] Living arrangements for the past 2 months: [place] Expected Discharge Date: 09/10/18                                    Prior Living Arrangements/Services Living arrangements for the past 2 months: [place] Lives with:: Facility Resident Patient language and need for interpreter reviewed:: Yes(no needs) Do you feel safe going back to the place where you live?: Yes      Need for Family Participation in Patient Care: No (Comment) Care giver support system in place?: Yes (comment)(facility resident) Current home services: DME Criminal Activity/Legal Involvement Pertinent to Current Situation/Hospitalization: No - Comment as needed  Activities of Daily Living Home Assistive Devices/Equipment: Environmental consultant (specify type), Wheelchair ADL Screening (condition at time of admission) Patient's cognitive ability  adequate to safely complete daily activities?: Yes Is the patient deaf or have difficulty hearing?: No Does the patient have difficulty seeing, even when wearing glasses/contacts?: No Does the patient have difficulty concentrating, remembering, or making decisions?: Yes Patient able to express need for assistance with ADLs?: Yes Does the patient have difficulty dressing or bathing?: Yes Independently performs ADLs?: No Communication: Dependent Is this a change from baseline?: Pre-admission baseline Dressing (OT): Dependent Is this a change from baseline?: Pre-admission baseline Grooming: Dependent Is this a change from baseline?: Pre-admission baseline Feeding: Dependent Is this a change from baseline?: Pre-admission baseline Bathing: Dependent Is this a change from baseline?: Pre-admission baseline Toileting: Dependent Is this a change from baseline?: Pre-admission baseline In/Out Bed: Dependent Is this a change from baseline?: Pre-admission baseline Walks in Home: Dependent Is this a change from baseline?: Pre-admission baseline Does the patient have difficulty walking or climbing stairs?: Yes Weakness of Legs: Both Weakness of Arms/Hands: Both  Permission Sought/Granted   Permission granted to share information with : No(pt with dx of dementia and parkinsons)  Share Information with NAME: [place] granted to share info w AGENCY: [place] granted to share info w Relationship: daughter  Permission granted to share info w Contact Information: [phone]  Emotional Assessment Appearance:: Other (Comment Required(spoke with daughter on telephone) Attitude/Demeanor/Rapport: (spoke with daughter on telephone) Affect (typically observed): (spoke with daughter on telephone) Orientation: : Oriented to Self, Oriented to Place, Oriented to  Time, Oriented to Situation, Fluctuating Orientation (Suspected and/or  reported Sundowners) Alcohol /  Substance Use: Not Applicable Psych Involvement: No (comment)  Admission diagnosis:  Hypoxia [R09.02] Choking episode [R09.89] Patient Active Problem List   Diagnosis Date Noted  . Food impaction of esophagus 09/05/2018  . Pacemaker   . Tachy-brady syndrome (HCC) 04/10/2018  . Chest pain, rule out acute myocardial infarction 04/09/2018  . Goals of care, counseling/discussion   . Palliative care by specialist   . Persistent atrial fibrillation 03/28/2018  . Sinus pause 03/28/2018  . Acute lower UTI 03/28/2018  . Dehydration 03/26/2018  . Alzheimer's dementia (HCC) 03/26/2018  . Essential hypertension 03/26/2018  . Hyperlipidemia 03/26/2018  . Hypothyroidism 03/26/2018  . AMS (altered mental status) 03/25/2018   PCP:  [name], Stephen D., MD Pharmacy:   [facility] [place] [place], [place] - [address] [place] [address] Phone: [phone] Fax: [phone]     Social Determinants of Health (SDOH) Interventions    Readmission Risk Interventions No flowsheet data found.

## 2018-09-06 NOTE — Anesthesia Postprocedure Evaluation (Signed)
Anesthesia Post Note  Patient: Jessica Hayes  Procedure(s) Performed: ESOPHAGOGASTRODUODENOSCOPY (EGD) WITH PROPOFOL (N/A ) BIOPSY     Patient location during evaluation: Endoscopy Anesthesia Type: MAC Level of consciousness: awake Pain management: pain level controlled Vital Signs Assessment: post-procedure vital signs reviewed and stable Respiratory status: spontaneous breathing Cardiovascular status: stable Postop Assessment: no apparent nausea or vomiting Anesthetic complications: no    Last Vitals:  Vitals:   09/06/18 1430 09/06/18 1435  BP: (!) 103/51 (!) 95/47  Pulse: 77 75  Resp: 18 17  Temp:    SpO2: 97% 100%    Last Pain:  Vitals:   09/06/18 1435  TempSrc:   PainSc: 0-No pain   Pain Goal:                   Huston Foley

## 2018-09-06 NOTE — Consult Note (Signed)
[facility] Consult  Referring Provider:Binaya Dahal, MD Primary Care Physician:  Helen Hashimoto., MD Primary Gastroenterologist: Althia Forts  Reason for Consultation: Dysphagia  HPI: Jessica Hayes is a 73 y.o. female admitted on 09/05/2018 with hypoxia, oxygen saturation in 18A post Heimlich maneuver performed at skilled nursing facility yesterday after patient was found choking and turning blue.  As per documentation a piece of chicken was removed from her throat.    Patient has almost no teeth left and does not have dentures.  She reports that she has had 2 episodes of choking in the last 1 month. It has been difficult for her to swallow solids more than liquids.  Reports having endoscopy performed in the past with balloon dilatations.    Denies change in bowel habits, required a laxative every 2 to 3 days, denies blood in stool or black stools. She denies acid reflux.She denies abdominal or chest pain.  She states that she lost about 40 pounds last year but has been able to gain all of that back.   Past Medical History:  Diagnosis Date  . Alzheimer disease ([place])   . Anxiety   . Bipolar disorder, unspecified ([place])   . Dementia ([place])   . Depressive disorder   . GERD (gastroesophageal reflux disease)   . Hypercholesteremia   . Hypertension   . Insomnia   . Lumbar disc disease    degenerative  . Parkinson disease ([place])   . Restless leg syndrome   . Thyroid disease    hypothyroid  . UTI (urinary tract infection)   . Weakness     Past Surgical History:  Procedure Laterality Date  . PACEMAKER IMPLANT N/A 04/10/2018   Procedure: PACEMAKER IMPLANT;  Surgeon: Constance Haw, MD;  Location: [facility];  Service: Cardiovascular;  Laterality: N/A;    Prior to Admission medications   Medication Sig Start Date End Date Taking? Authorizing Provider  acetaminophen (TYLENOL) 650 MG CR tablet Take 1,300 mg by mouth every 4 (four) hours as needed for pain.    Yes [provider]  aspirin EC 81 MG tablet Take 81 mg by mouth daily.   Yes [provider]  baclofen (LIORESAL) 10 MG tablet Take 10 mg by mouth at bedtime.   Yes [provider]  carbidopa-levodopa (SINEMET IR) 25-100 MG tablet Take 1 tablet by mouth 3 (three) times daily.   Yes [provider]  carvedilol (COREG) 6.25 MG tablet Take 1 tablet (6.25 mg total) by mouth 2 (two) times daily with a meal. 04/11/18  Yes Gherghe, Vella Redhead, MD  escitalopram (LEXAPRO) 5 MG tablet Take 15 mg by mouth daily.   Yes [provider]  fenofibrate (TRICOR) 48 MG tablet Take 48 mg by mouth daily.   Yes [provider]  furosemide (LASIX) 20 MG tablet Take 1 tablet (20 mg total) by mouth as needed for edema. Patient taking differently: Take 20 mg by mouth daily as needed for edema.  03/31/18  Yes Oretha Milch D, MD  LACTOBACILLUS PO Take 1 capsule by mouth daily.   Yes [provider]  levothyroxine (SYNTHROID, LEVOTHROID) 50 MCG tablet Take 50 mcg by mouth daily before breakfast.   Yes [provider]  lidocaine (LIDODERM) 5 % Place 1 patch onto the skin daily. Remove & Discard patch within 12 hours or as directed by MD Patient taking differently: Place 1 patch onto the skin daily as needed (pain). Remove & Discard patch within 12 hours or  as directed by MD 08/12/18  Yes Luevenia MaxinFawze, Mina A, PA-C  nitroGLYCERIN (NITROSTAT) 0.4 MG SL tablet Place 0.4 mg under the tongue every 5 (five) minutes as needed for chest pain.   Yes [provider]  OLANZapine (ZYPREXA) 15 MG tablet Take 15 mg by mouth at bedtime.   Yes [provider]  oxybutynin (DITROPAN-XL) 10 MG 24 hr tablet Take 10 mg by mouth at bedtime.   Yes [provider]  pantoprazole (PROTONIX) 40 MG tablet Take 1 tablet (40 mg total) by mouth daily. 04/10/18  Yes Almon HerculesGonfa, Taye T, MD  rOPINIRole (REQUIP) 0.5 MG tablet Take 0.5 mg by mouth at bedtime.   Yes [provider]  temazepam (RESTORIL) 7.5 MG capsule Take 1 capsule (7.5 mg total) by mouth at bedtime. 04/11/18  Yes Leatha GildingGherghe, Costin M, MD    Current Facility-Administered Medications  Medication Dose Route Frequency Provider Last Rate Last Dose  . cefTRIAXone (ROCEPHIN) 1 g in sodium chloride 0.9 % 100 mL IVPB  1 g Intravenous Q24H Earlie LouGarba, Mohammad L, MD 200 mL/hr at 09/06/18 0240 1 g at 09/06/18 0240  . chlorhexidine (PERIDEX) 0.12 % solution 15 mL  15 mL Mouth Rinse BID Dahal, Binaya, MD   15 mL at 09/06/18 1013  . dextrose 5 %-0.9 % sodium chloride infusion   Intravenous Continuous Earlie LouGarba, Mohammad L, MD 100 mL/hr at 09/06/18 0236    . heparin injection 5,000 Units  5,000 Units Subcutaneous Q8H Rometta EmeryGarba, Mohammad L, MD   5,000 Units at 09/06/18 0802  . MEDLINE mouth rinse  15 mL Mouth Rinse q12n4p Dahal, Binaya, MD   15 mL at 09/06/18 1201  . ondansetron (ZOFRAN) tablet 4 mg  4 mg Oral Q6H PRN Rometta EmeryGarba, Mohammad L, MD       Or  . ondansetron (ZOFRAN) injection 4 mg  4 mg Intravenous Q6H PRN Rometta EmeryGarba, Mohammad L, MD        Allergies as of 09/05/2018 - Review Complete 09/05/2018  Allergen Reaction Noted  . Demerol [meperidine hcl] Other (See Comments) 03/26/2018    Family History  Problem Relation Age of Onset  . Diabetes Mother   . Heart disease Mother   . Heart disease Father     Social History   Socioeconomic History  . Marital status: Widowed    Spouse name: Not on file  . Number of children: Not on file  . Years of education: Not on file  . Highest education level: Not on file  Occupational History  . Not on file  Social Needs  . Financial resource strain: Not on file  . Food insecurity    Worry: Not on file    Inability: Not on file  . Transportation needs    Medical: Not on file    Non-medical: Not on file  Tobacco Use  . Smoking status: Unknown If Ever Smoked  . Smokeless tobacco: Never Used  Substance and Sexual Activity  . Alcohol use: Not Currently  . Drug use: Not  Currently  . Sexual activity: Not Currently  Lifestyle  . Physical activity    Days per week: Not on file    Minutes per session: Not on file  . Stress: Not on file  Relationships  . Social Musicianconnections    Talks on phone: Not on file    Gets together: Not on file    Attends religious service: Not on file    Active member of club or organization: Not on file  Attends meetings of clubs or organizations: Not on file    Relationship status: Not on file  . Intimate partner violence    Fear of current or ex partner: Not on file    Emotionally abused: Not on file    Physically abused: Not on file    Forced sexual activity: Not on file  Other Topics Concern  . Not on file  Social History Narrative  . Not on file    Review of Systems: Positive for: GI: Described in detail in HPI.    Gen: Denies any fever, chills, rigors, night sweats, anorexia, fatigue, weakness, malaise, involuntary weight loss, and sleep disorder CV: Denies chest pain, angina, palpitations, syncope, orthopnea, PND, peripheral edema, and claudication. Resp: Denies dyspnea, cough, sputum, wheezing, coughing up blood. GU : Denies urinary burning, blood in urine, urinary frequency, urinary hesitancy, nocturnal urination, and urinary incontinence. MS: States she is mostly wheelchair-bound, denies joint pain or swelling.  Denies muscle cramps, atrophy.  Derm: Denies rash, itching, oral ulcerations, hives, unhealing ulcers.  Psych: Denies depression, anxiety, memory loss, suicidal ideation, hallucinations,  and confusion. Heme: Denies bruising, bleeding, and enlarged lymph nodes. Neuro: Tremors, denies any headaches, dizziness, paresthesias. Endo:  Denies any problems with DM, thyroid, adrenal function.  Physical Exam: Vital signs in last 24 hours: Temp:  [98.2 F (36.8 C)-98.4 F (36.9 C)] 98.2 F (36.8 C) (06/19 0645) Pulse Rate:  [61-93] 69 (06/19 0645) Resp:  [14-26] 18 (06/19 0645) BP: (114-151)/(60-98) 114/79  (06/19 0645) SpO2:  [92 %-100 %] 100 % (06/19 0645) Last BM Date: 09/04/18  General:   Alert,  Well-developed, overweight, pleasant and cooperative in NAD Head:  Normocephalic and atraumatic. Eyes:  Sclera clear, no icterus.   Conjunctiva pink. Ears:  Normal auditory acuity. Nose:  No deformity, discharge,  or lesions. Mouth:  No deformity or lesions.  Oropharynx pink & moist. Neck:  Supple; no masses or thyromegaly. Lungs:  Clear throughout to auscultation.   No wheezes, crackles, or rhonchi. No acute distress. Heart: Permanent pacemaker over left chest, regular rate and rhythm; no murmurs, clicks, rubs,  or gallops. Extremities:  Without clubbing or edema. Neurologic: Resting tremors appreciated in bilateral hands, alert and  oriented x4;  grossly normal neurologically. Skin:  Intact without significant lesions or rashes. Psych:  Alert and cooperative. Normal mood and affect. Abdomen: Upper midline surgical scar , soft, nontender and nondistended. No masses, hepatosplenomegaly or hernias noted. Normal bowel sounds, without guarding, and without rebound.         Lab Results: Recent Labs    09/05/18 1843 09/06/18 0222  WBC 7.2 8.9  HGB 14.3 14.5  HCT 44.3 44.2  PLT 175 127*   BMET Recent Labs    09/05/18 1843 09/06/18 0222  NA 136 138  K 4.6 4.7  CL 105 107  CO2 21* 23  GLUCOSE 192* 117*  BUN 18 16  CREATININE 1.06* 0.91  CALCIUM 8.6* 8.9   LFT Recent Labs    09/06/18 0222  PROT 6.3*  ALBUMIN 3.3*  AST 37  ALT 31  ALKPHOS 151*  BILITOT 0.9   PT/INR No results for input(s): LABPROT, INR in the last 72 hours.  Studies/Results: Ct Chest W Contrast  Result Date: 09/05/2018 CLINICAL DATA:  Chest pain.  Patient choked on a piece of chicken. EXAM: CT CHEST WITH CONTRAST TECHNIQUE: Multidetector CT imaging of the chest was performed during intravenous contrast administration. CONTRAST:  75mL OMNIPAQUE IOHEXOL 300 MG/ML  SOLN COMPARISON:  04/08/2018  FINDINGS:  Cardiovascular: The heart size is normal. No substantial pericardial effusion. Coronary artery calcification is evident. Atherosclerotic calcification is noted in the wall of the thoracic aorta. Mediastinum/Nodes: 1.8 x 2.5 cm calcified nodule in the left thoracic inlet is stable since the prior study and comparing back to a scan from 04/25/2017. No mediastinal lymphadenopathy. There is no hilar lymphadenopathy. The esophagus has normal imaging features. There is no axillary lymphadenopathy. Lungs/Pleura: 5 mm posterior right apical nodule (23/4) stable since 04/25/2017, consistent with benign etiology. 4 mm posterior right upper lobe nodule is also stable. 5 mm right middle lobe nodule has progressed since 04/08/2018 and is new since 04/25/2017. Other tiny bilateral pulmonary nodules are stable. No pulmonary edema. No pleural effusion. Probable atelectasis in the dependent lung bases. Upper Abdomen: Unremarkable. Musculoskeletal: No worrisome lytic or sclerotic osseous abnormality. IMPRESSION: 1. No acute findings. 2. Scattered tiny bilateral pulmonary nodules. One of these has been progressive since 04/25/2017, now measuring up to 5 mm in the right middle lobe. Consider follow-up CT in 6-12 months to ensure stability. 3.  Aortic Atherosclerois (ICD10-170.0) Electronically Signed   By: Kennith CenterEric  Mansell M.D.   On: 09/05/2018 20:22   Dg Chest Portable 1 View  Result Date: 09/05/2018 CLINICAL DATA:  Shortness of breath EXAM: PORTABLE CHEST 1 VIEW COMPARISON:  08/12/2018, 04/11/2018 FINDINGS: Left-sided pacing device as before. Chronic elevation of right diaphragm. No acute opacity. Stable cardiomediastinal silhouette with aortic atherosclerosis. IMPRESSION: No active disease. Electronically Signed   By: Jasmine PangKim  Fujinaga M.D.   On: 09/05/2018 19:30    Impression: Dysphagia, solids more than liquids, with 2 episodes of choking in 1 month, required Heimlich maneuver yesterday and presented with hypoxia  Multiple  comorbidities-Parkinson's disease, Alzheimer's dementia, bipolar disorder, hypertension, hypothyroidism, permanent pacemaker in place on aspirin 81 mg daily  Plan: Patient's dysphagia is likely related to not having teeth or dentures. We will proceed with diagnostic endoscopy to evaluate for esophageal cause of dysphagia and perform esophageal biopsies and balloon dilatation if needed. The risks and the benefits of the procedure were discussed with the patient in details. She understands and verbalizes consent. She has remained n.p.o. since admission.   LOS: 1 day   Kerin SalenArya John Williamsen, MD  09/06/2018, 12:04 PM  Pager [phone] If no answer or after 5 PM call [phone]

## 2018-09-06 NOTE — NC FL2 (Signed)
[place] MEDICAID FL2 LEVEL OF CARE SCREENING TOOL     IDENTIFICATION  Patient Name: Jessica Hayes Birthdate: October 25, 1945 Sex: female Admission Date (Current Location): 09/05/2018  [facility] [place] Number:  Herbalist and Address:  [facility] [place], [place] [address], [place], [place] [address]      Provider Number: [id number]  Attending Physician Name and Address:  Terrilee Croak, MD  Relative Name and Phone Number:  Berneda Rose; daughter; [phone]    Current Level of Care: Hospital Recommended Level of Care: [place] Prior Approval Number:    Date Approved/Denied:   PASRR Number: [id number]  Discharge Plan: SNF    Current Diagnoses: Patient Active Problem List   Diagnosis Date Noted  . Food impaction of esophagus 09/05/2018  . Pacemaker   . Tachy-brady syndrome ([place]) 04/10/2018  . Chest pain, rule out acute myocardial infarction 04/09/2018  . Goals of care, counseling/discussion   . Palliative care by specialist   . Persistent atrial fibrillation 03/28/2018  . Sinus pause 03/28/2018  . Acute lower UTI 03/28/2018  . Dehydration 03/26/2018  . Alzheimer's dementia ([place]) 03/26/2018  . Essential hypertension 03/26/2018  . Hyperlipidemia 03/26/2018  . Hypothyroidism 03/26/2018  . AMS (altered mental status) 03/25/2018    Orientation RESPIRATION BLADDER Height & Weight    Self, Situation, Place    O2(2L nasal canula) Incontinent, External catheter Weight:   Height:     BEHAVIORAL SYMPTOMS/MOOD NEUROLOGICAL BOWEL NUTRITION STATUS      Continent Diet(see discharge summary)  AMBULATORY STATUS COMMUNICATION OF NEEDS Skin   Extensive Assist Verbally Other (Comment)(petichae on mid abdomen)                       Personal Care Assistance Level of Assistance  Bathing, Feeding, Dressing Bathing Assistance: Maximum assistance Feeding assistance: Maximum assistance Dressing Assistance: Maximum assistance      Functional Limitations Info  Sight, Hearing, Speech Sight Info: Adequate Hearing Info: Adequate Speech Info: Adequate    SPECIAL CARE FACTORS FREQUENCY  OT (By licensed OT), PT (By licensed PT)     PT Frequency: 5x week OT Frequency: 5x week            Contractures Contractures Info: Not present    Additional Factors Info  Code Status, Allergies Code Status Info: DNR Allergies Info: Demerol (Merperidine Hcl)           Current Medications (09/06/2018):  This is the current hospital active medication list Current Facility-Administered Medications  Medication Dose Route Frequency Provider Last Rate Last Dose  . cefTRIAXone (ROCEPHIN) 1 g in sodium chloride 0.9 % 100 mL IVPB  1 g Intravenous Q24H Gala Romney L, MD 200 mL/hr at 09/06/18 0240 1 g at 09/06/18 0240  . chlorhexidine (PERIDEX) 0.12 % solution 15 mL  15 mL Mouth Rinse BID Dahal, Binaya, MD      . dextrose 5 %-0.9 % sodium chloride infusion   Intravenous Continuous Gala Romney L, MD 100 mL/hr at 09/06/18 0236    . heparin injection 5,000 Units  5,000 Units Subcutaneous Q8H Elwyn Reach, MD   5,000 Units at 09/06/18 0802  . MEDLINE mouth rinse  15 mL Mouth Rinse q12n4p Dahal, Binaya, MD      . ondansetron (ZOFRAN) tablet 4 mg  4 mg Oral Q6H PRN Elwyn Reach, MD       Or  . ondansetron (ZOFRAN) injection 4 mg  4 mg Intravenous Q6H  PRN Rometta EmeryGarba, Mohammad L, MD         Discharge Medications: Please see discharge summary for a list of discharge medications.  Relevant Imaging Results:  Relevant Lab Results:   Additional Information SSN: [id number]  [place] Gavin [address] [place], [place]

## 2018-09-06 NOTE — Anesthesia Procedure Notes (Signed)
Procedure Name: MAC Date/Time: 09/06/2018 1:48 PM Performed by: Barrington Ellison, CRNA Pre-anesthesia Checklist: Patient identified, Emergency Drugs available, Suction available, Patient being monitored and Timeout performed Patient Re-evaluated:Patient Re-evaluated prior to induction Oxygen Delivery Method: Nasal cannula

## 2018-09-06 NOTE — Brief Op Note (Signed)
09/05/2018 - 09/06/2018  2:14 PM  PATIENT:  Jessica Hayes  73 y.o. female  PRE-OPERATIVE DIAGNOSIS:  Dysphagia  POST-OPERATIVE DIAGNOSIS:  biopsies for H. Pylori Barretts and EOE, gastritis   PROCEDURE:  Procedure(s): ESOPHAGOGASTRODUODENOSCOPY (EGD) WITH PROPOFOL (N/A) BIOPSY  SURGEON:  Surgeon(s) and Role:    Ronnette Juniper, MD - Primary  PHYSICIAN ASSISTANT:   ASSISTANTS: Raynelle Bring, RN, Ladona Ridgel, Tech   ANESTHESIA:   MAC  EBL:  Minimal  BLOOD ADMINISTERED:none  DRAINS: none   LOCAL MEDICATIONS USED:  NONE  SPECIMEN:  Biopsy / Limited Resection  DISPOSITION OF SPECIMEN:  PATHOLOGY  COUNTS:  YES  TOURNIQUET:  * No tourniquets in log *  DICTATION: .Dragon Dictation  PLAN OF CARE: Admit to inpatient   PATIENT DISPOSITION:  PACU - hemodynamically stable.   Delay start of Pharmacological VTE agent (>24hrs) due to surgical blood loss or risk of bleeding: not applicable

## 2018-09-06 NOTE — Social Work (Signed)
Acknowledging pt from [employer], [place] continuing to follow for support with disposition when medically appropriate.  Westley Hummer, MSW, [place] Work [phone]

## 2018-09-06 NOTE — Progress Notes (Signed)
PROGRESS NOTE  MORNING HALBERG ZDG:[id number] DOB: 1945/05/01 DOA: 09/05/2018 PCP: Helen Hashimoto., MD   LOS: 1 day   Brief narrative: Jessica Hayes is a 73 y.o. female with medical history significant of Parkinson's disease, Alzheimer's dementia, bipolar disorder, GERD, hypertension, hypothyroidism who was brought in from skilled facility after choking while eating.  Patient apparently has had this before but today it was significant.  She was turning blue.  Heimlich maneuver was tried.  A piece of chicken was removed from her throat.  Patient was brought to the ER.  In the ED, patient became hypoxic with oxygen sat dropping into the 70s.  She was placed on oxygen by nasal and admitted for further evaluation.   ED Course: Temperature is 98.4 with blood pressure 150/98.  Pulse 93 respiratory 26 oxygen sat currently 92% on 2 L.  She has a white count of 7.2 hemoglobin 14.3 platelets 175.  Sodium 136 potassium 4.3 chloride 104 CO2 21 BUN 18 creatinine 1.06 and calcium 8.6.  Glucose is 192.  Urinalysis so far negative.  Chest x-ray showed no active disease.  CT angiogram of the chest showed no acute findings with scattered tiny bilateral pulmonary nodules.  Aortic atherosclerosis.  Patient was admitted due to sudden hypoxia.  Subjective: Patient was seen and examined this afternoon.  Not in distress.  Patient underwent EGD earlier with findings as below.  Assessment/Plan:  Principal Problem:   Food impaction of esophagus Active Problems:   Dehydration   Alzheimer's dementia ([place])   Essential hypertension   Hyperlipidemia   Hypothyroidism   Persistent atrial fibrillation   Acute lower UTI  #1 Food impaction: This has resolved.  Appears patient has chronic dysphagia.   #2 dysphagia:  Underwent EGD which showed tortuous mid and distal esophagus, biopsies taken to rule out eosinophilic esophagitis. Variable Z line at 38 cm, biopsies taken to rule out Barrett's. Intact fundoplication  noted. Mild amount of retained food in gastric cavity. Mild antral gastritis, biopsies taken for H. Pylori. So, her dysphagia is likely worsened with missing teeth and dentures along with tortuous esophagus and an intact fundoplication. No obvious luminal narrowing or stricture noted. Biopsies may be followed as an outpatient.  #3 possible UTI: Patient has not complained of urinary symptoms but clearly has positive nitrite leukocyte estrace WBCs and bacteria.  IV Rocephin was empirically started on admission.  Send urine culture.  #4 Parkinson's disease: Resume home regimen.  #5 Alzheimer's dementia: Appears to be at her baseline.  No confusion at this time.  #6 hypothyroidism: Continue with levothyroxine.  #7 history of atrial fibrillation: Patient appears to be in sinus rhythm now but rate is controlled.  Mobility: Encourage ambulation Diet: Mechanical soft diet DVT prophylaxis:  Heparin subcu Code Status:   Code Status: DNR  Family Communication:  Expected Discharge:  Plan to discharge to nursing facility tomorrow  Consultants:  GI  Procedures:  EGD today.  Antimicrobials:  Anti-infectives (From admission, onward)   Start     Dose/Rate Route Frequency Ordered Stop   09/06/18 0100  [MAR Hold]  cefTRIAXone (ROCEPHIN) 1 g in sodium chloride 0.9 % 100 mL IVPB     (MAR Hold since Fri 09/06/2018 at 1239.Hold Reason: Transfer to a Procedural area.)   1 g 200 mL/hr over 30 Minutes Intravenous Every 24 hours 09/06/18 0015        Infusions:  . sodium chloride    . [MAR Hold] cefTRIAXone (ROCEPHIN)  IV 1 g (09/06/18 0240)  .  dextrose 5 % and 0.9% NaCl 100 mL/hr at 09/06/18 0236  . lactated ringers Stopped (09/06/18 1423)    Scheduled Meds: . [MAR Hold] chlorhexidine  15 mL Mouth Rinse BID  . [MAR Hold] heparin  5,000 Units Subcutaneous Q8H  . [facility] Hold] mouth rinse  15 mL Mouth Rinse q12n4p    PRN meds: [MAR Hold] ondansetron **OR** [MAR Hold] ondansetron (ZOFRAN) IV    Objective: Vitals:   09/06/18 1430 09/06/18 1435  BP: (!) 103/51 (!) 95/47  Pulse: 77 75  Resp: 18 17  Temp:    SpO2: 97% 100%    Intake/Output Summary (Last 24 hours) at 09/06/2018 1445 Last data filed at 09/06/2018 1409 Gross per 24 hour  Intake 100 ml  Output 425 ml  Net -325 ml   Filed Weights   09/06/18 1241  Weight: 73.5 kg   Weight change:  Body mass index is 27.81 kg/m.   Physical Exam: General exam: Appears calm and comfortable.  Skin: No rashes, lesions or ulcers. HEENT: Atraumatic, normocephalic, supple neck, no obvious bleeding Lungs: Clear to auscultation bilaterally CVS: Regular rate and rhythm, no murmur GI/Abd soft, nontender, nondistended, bowel sound present CNS: Alert, awake Psychiatry: Appropriate mood Extremities: No pedal edema, no calf tenderness  Data Review: I have personally reviewed the laboratory data and studies available.  Recent Labs  Lab 09/05/18 1843 09/06/18 0222  WBC 7.2 8.9  NEUTROABS 4.0  --   HGB 14.3 14.5  HCT 44.3 44.2  MCV 89.9 88.2  PLT 175 127*   Recent Labs  Lab 09/05/18 1843 09/06/18 0222  NA 136 138  K 4.6 4.7  CL 105 107  CO2 21* 23  GLUCOSE 192* 117*  BUN 18 16  CREATININE 1.06* 0.91  CALCIUM 8.6* 8.9    Lorin GlassBinaya Ardine Iacovelli, MD  [facility] 09/06/2018

## 2018-09-06 NOTE — Op Note (Signed)
Endoscopy was performed for dysphagia, patient recently required Heimlich maneuver and was hypoxic.  Findings: The mid and distal esophagus appeared tortuous.  Biopsies taken to rule out eosinophilic esophagitis. Variable Z line at 38 cm, biopsies taken to rule out Barrett's. Intact fundoplication noted. Mild amount of retained food in gastric cavity. Mild antral gastritis, biopsies taken for H. Pylori. Normal duodenal bulb and duodenum.  Recommendations: Mechanical soft diet. Dysphagia is likely worsened with missing teeth and dentures along with tortuous esophagus and an intact fundoplication. No obvious luminal narrowing or stricture noted. Okay to DC home from GI standpoint. Biopsies may be followed as an outpatient.  Ronnette Juniper, MD

## 2018-09-06 NOTE — ED Notes (Signed)
ED TO INPATIENT HANDOFF REPORT  ED Nurse Name and Phone #:  S Name/Age/Gender Jessica Hayes 73 y.o. female Room/Bed: 042C/042C  Code Status   Code Status: Prior  Home/SNF/Other DC SNF [facility] PT AO to self Is this baseline? Yes Hx of dementia  Triage Complete: Triage complete  Chief Complaint choked  Triage Note Pt arrives via EMS from [facility] with reports of pt choking. EMS was able to get a large piece of chicken out of pts throat. Pt switched from [facility] to [place]. Pt endorsing some CP.    Allergies Allergies  Allergen Reactions  . Demerol [Meperidine Hcl] Other (See Comments)    Unknown reaction - per MAR    Level of Care/Admitting Diagnosis ED Disposition    ED Disposition Condition Comment   Admit  [facility] [[id number]]  Level of Care: Med-Surg [16]  Covid Evaluation: N/A  Diagnosis: Food impaction of esophagus [727005]  Admitting Physician: Rometta EmeryGARBA, MOHAMMAD L [[id number]]  Attending Physician: Rometta EmeryGARBA, MOHAMMAD L [[id number]]  Estimated length of stay: past midnight tomorrow  Certification:: I certify this patient will need inpatient services for at least 2 midnights  PT Class (Do Not Modify): Inpatient [101]  PT Acc Code (Do Not Modify): Private [1]       B Medical/Surgery History Past Medical History:  Diagnosis Date  . Alzheimer disease (HCC)   . Anxiety   . Bipolar disorder, unspecified (HCC)   . Dementia (HCC)   . Depressive disorder   . GERD (gastroesophageal reflux disease)   . Hypercholesteremia   . Hypertension   . Insomnia   . Lumbar disc disease    degenerative  . Parkinson disease (HCC)   . Restless leg syndrome   . Thyroid disease    hypothyroid  . UTI (urinary tract infection)   . Weakness    Past Surgical History:  Procedure Laterality Date  . PACEMAKER IMPLANT N/A 04/10/2018   Procedure: PACEMAKER IMPLANT;  Surgeon: Regan Lemmingamnitz, Will Martin, MD;  Location: [facility] INVASIVE CV LAB;  Service:  Cardiovascular;  Laterality: N/A;     A IV Location/Drains/Wounds Patient Lines/Drains/Airways Status   Active Line/Drains/Airways    Name:   Placement date:   Placement time:   Site:   Days:   Peripheral IV 09/05/18 Right Hand   09/05/18    1905    Hand   less than 1   Incision (Closed) 04/10/18 Chest Lateral;Left;Upper   04/10/18    1115     148          Intake/Output Last 24 hours No intake or output data in the 24 hours ending 09/05/18 2355  Labs/Imaging Results for orders placed or performed during the hospital encounter of 09/05/18 (from the past 48 hour(s))  Basic metabolic panel     Status: Abnormal   Collection Time: 09/05/18  6:43 PM  Result Value Ref Range   Sodium 136 135 - 145 mmol/L   Potassium 4.6 3.5 - 5.1 mmol/L   Chloride 105 98 - 111 mmol/L   CO2 21 (L) 22 - 32 mmol/L   Glucose, Bld 192 (H) 70 - 99 mg/dL   BUN 18 8 - 23 mg/dL   Creatinine, Ser 0.981.06 (H) 0.44 - 1.00 mg/dL   Calcium 8.6 (L) 8.9 - 10.3 mg/dL   GFR calc non Af Amer 52 (L) >60 mL/min   GFR calc Af Amer 60 (L) >60 mL/min   Anion gap 10 5 - 15  Comment: Performed at [facility], [place] [address] [place], [place] [address]  CBC with Differential     Status: Abnormal   Collection Time: 09/05/18  6:43 PM  Result Value Ref Range   WBC 7.2 4.0 - 10.5 K/uL   RBC 4.93 3.87 - 5.11 MIL/uL   Hemoglobin 14.3 12.0 - 15.0 g/dL   HCT 44.3 36.0 - 46.0 %   MCV 89.9 80.0 - 100.0 fL   MCH 29.0 26.0 - 34.0 pg   MCHC 32.3 30.0 - 36.0 g/dL   RDW 13.0 11.5 - 15.5 %   Platelets 175 150 - 400 K/uL   nRBC 0.0 0.0 - 0.2 %   Neutrophils Relative % 54 %   Neutro Abs 4.0 1.7 - 7.7 K/uL   Lymphocytes Relative 32 %   Lymphs Abs 2.3 0.7 - 4.0 K/uL   Monocytes Relative 7 %   Monocytes Absolute 0.5 0.1 - 1.0 K/uL   Eosinophils Relative 4 %   Eosinophils Absolute 0.3 0.0 - 0.5 K/uL   Basophils Relative 1 %   Basophils Absolute 0.0 0.0 - 0.1 K/uL   Immature Granulocytes 2 %   Abs Immature Granulocytes 0.11  (H) 0.00 - 0.07 K/uL    Comment: Performed at [facility] [address] [place], [place] [address]  SARS Coronavirus 2     Status: None   Collection Time: 09/05/18  6:48 PM  Result Value Ref Range   SARS Coronavirus 2 NOT DETECTED NOT DETECTED    Comment: (NOTE) SARS-CoV-2 target nucleic acids are NOT DETECTED. The SARS-CoV-2 RNA is generally detectable in upper and lower respiratory specimens during the acute phase of infection.  Negative  results do not preclude SARS-CoV-2 infection, do not rule out co-infections with other pathogens, and should not be used as the sole basis for treatment or other patient management decisions.  Negative results must be combined with clinical observations, patient history, and epidemiological information. The expected result is Not Detected. Fact Sheet for Patients: http://www.biofiredefense.com/wp-content/uploads/2020/03/BIOFIRE-COVID -19-patients.pdf Fact Sheet for Healthcare Providers: http://www.biofiredefense.com/wp-content/uploads/2020/03/BIOFIRE-COVID -19-hcp.pdf This test is not yet approved or cleared by the [place] and  has been authorized for detection and/or diagnosis of SARS-CoV-2 by FDA under an Emergency Use Authorization (EUA).  This EUA will remain in effec t (meaning this test can be used) for the duration of  the COVID-19 declaration under Section 564(b)(1) of the Act, 21 U.S.C. section 360bbb-3(b)(1), unless the authorization is terminated or revoked sooner. Performed at [facility], [place] [address] [place], [place] [address]   Urinalysis, Routine w reflex microscopic     Status: Abnormal   Collection Time: 09/05/18 11:01 PM  Result Value Ref Range   Color, Urine YELLOW YELLOW   APPearance HAZY (A) CLEAR   Specific Gravity, Urine >1.046 (H) 1.005 - 1.030   pH 6.0 5.0 - 8.0   Glucose, UA NEGATIVE NEGATIVE mg/dL   Hgb urine dipstick NEGATIVE NEGATIVE   Bilirubin Urine NEGATIVE NEGATIVE    Ketones, ur NEGATIVE NEGATIVE mg/dL   Protein, ur NEGATIVE NEGATIVE mg/dL   Nitrite POSITIVE (A) NEGATIVE   Leukocytes,Ua LARGE (A) NEGATIVE   RBC / HPF 0-5 0 - 5 RBC/hpf   WBC, UA 21-50 0 - 5 WBC/hpf   Bacteria, UA RARE (A) NONE SEEN   Squamous Epithelial / LPF 6-10 0 - 5   Mucus PRESENT     Comment: Performed at [facility], [address], [place], [place]  [phone]   Ct Chest W Contrast  Result Date: 09/05/2018 CLINICAL DATA:  Chest pain.  Patient choked on a piece of chicken. EXAM: CT CHEST WITH CONTRAST TECHNIQUE: Multidetector CT imaging of the chest was performed during intravenous contrast administration. CONTRAST:  75mL OMNIPAQUE IOHEXOL 300 MG/ML  SOLN COMPARISON:  04/08/2018 FINDINGS: Cardiovascular: The heart size is normal. No substantial pericardial effusion. Coronary artery calcification is evident. Atherosclerotic calcification is noted in the wall of the thoracic aorta. Mediastinum/Nodes: 1.8 x 2.5 cm calcified nodule in the left thoracic inlet is stable since the prior study and comparing back to a scan from 04/25/2017. No mediastinal lymphadenopathy. There is no hilar lymphadenopathy. The esophagus has normal imaging features. There is no axillary lymphadenopathy. Lungs/Pleura: 5 mm posterior right apical nodule (23/4) stable since 04/25/2017, consistent with benign etiology. 4 mm posterior right upper lobe nodule is also stable. 5 mm right middle lobe nodule has progressed since 04/08/2018 and is new since 04/25/2017. Other tiny bilateral pulmonary nodules are stable. No pulmonary edema. No pleural effusion. Probable atelectasis in the dependent lung bases. Upper Abdomen: Unremarkable. Musculoskeletal: No worrisome lytic or sclerotic osseous abnormality. IMPRESSION: 1. No acute findings. 2. Scattered tiny bilateral pulmonary nodules. One of these has been progressive since 04/25/2017, now measuring up to 5 mm in the right middle lobe. Consider follow-up CT in 6-12 months to  ensure stability. 3.  Aortic Atherosclerois (ICD10-170.0) Electronically Signed   By: Kennith CenterEric  Mansell M.D.   On: 09/05/2018 20:22   Dg Chest Portable 1 View  Result Date: 09/05/2018 CLINICAL DATA:  Shortness of breath EXAM: PORTABLE CHEST 1 VIEW COMPARISON:  08/12/2018, 04/11/2018 FINDINGS: Left-sided pacing device as before. Chronic elevation of right diaphragm. No acute opacity. Stable cardiomediastinal silhouette with aortic atherosclerosis. IMPRESSION: No active disease. Electronically Signed   By: Jasmine PangKim  Fujinaga M.D.   On: 09/05/2018 19:30    Pending Labs [employer]ed Labs (From admission, onward)    Start     Ordered   Signed and Held  CBC  (heparin)  Once,   R    Comments: Baseline for heparin therapy IF NOT ALREADY DRAWN.  Notify MD if PLT < 100 K.    Signed and Held   Signed and Held  Creatinine, serum  (heparin)  Once,   R    Comments: Baseline for heparin therapy IF NOT ALREADY DRAWN.    Signed and Held   Signed and Held  Comprehensive metabolic panel  Tomorrow morning,   R     Signed and Held   Signed and Held  CBC  Tomorrow morning,   R     Signed and Held          Vitals/Pain Today's Vitals   09/05/18 2200 09/05/18 2215 09/05/18 2230 09/05/18 2300  BP: 130/60 (!) 143/73 131/71 119/60  Pulse: 71 72 73 67  Resp: 19 19 17 15   Temp:      TempSrc:      SpO2: 96% 95% 96% 100%  PainSc:        Isolation Precautions No active isolations  Medications Medications  ondansetron (ZOFRAN) injection 4 mg (4 mg Intravenous Given 09/05/18 1922)  iohexol (OMNIPAQUE) 300 MG/ML solution 75 mL (75 mLs Intravenous Contrast Given 09/05/18 1954)    Mobility Complete care with external female catheter High fall risk  Focused Assessments Pt from [facility], hx of dementia shocked today with a big piece of chicken, EMS manage to take chicken out, but pt still drops SPO2 saturation  with ambulation. Will be admitted for observation.   R Recommendations: See Admitting Provider  Note  Report given to:   Additional Notes:

## 2018-09-06 NOTE — Op Note (Signed)
[facility] Patient Name: Jessica BoxerCarolyn Hayes Procedure Date : 09/06/2018 MRN: [id number] Attending MD: Kerin SalenArya Soraiya Ahner , MD Date of Birth: Feb 11, 1946 CSN: [id number] Age: [age] Admit Type: Inpatient Procedure:                Upper GI endoscopy Indications:              Dysphagia (required Heimlich manuever and was                            hypoxic yesterday) Providers:                Kerin SalenArya Baeleigh Devincent, MD, April HoldingJamie Bailey, RN, Brion AlimentShayla Proctor,                            Technician Referring MD:              Medicines:                Monitored Anesthesia Care Complications:            No immediate complications. Estimated blood loss:                            Minimal. Estimated Blood Loss:     Estimated blood loss was minimal. Procedure:                Pre-Anesthesia Assessment:                           - Prior to the procedure, a History and Physical                            was performed, and patient medications and                            allergies were reviewed. The patient's tolerance of                            previous anesthesia was also reviewed. The risks                            and benefits of the procedure and the sedation                            options and risks were discussed with the patient.                            All questions were answered, and informed consent                            was obtained. Prior Anticoagulants: The patient has                            taken no previous anticoagulant or antiplatelet                            agents except for aspirin. ASA  Grade Assessment:                            III - A patient with severe systemic disease. After                            reviewing the risks and benefits, the patient was                            deemed in satisfactory condition to undergo the                            procedure.                           After obtaining informed consent, the endoscope was   passed under direct vision. Throughout the                            procedure, the patient's blood pressure, pulse, and                            oxygen saturations were monitored continuously. The                            GIF-H190 ([id number] Olympus gastroscope was                            introduced through the mouth, and advanced to the                            second part of duodenum. The upper GI endoscopy was                            accomplished without difficulty. The patient                            tolerated the procedure well. Scope In: Scope Out: Findings:      The middle third of the esophagus and lower third of the esophagus were       significantly tortuous. Biopsies were obtained from the proximal and       distal esophagus with cold forceps for histology of suspected       eosinophilic esophagitis.      The Z-line was variable and was found 38 cm from the incisors. Mucosa       was biopsied with a cold forceps for histology in a targeted manner at       the gastroesophageal junction. One specimen bottle was sent to pathology.      Evidence of a fundoplication was found in the gastric fundus. The wrap       appeared intact. This was traversed.      Localized mildly erythematous mucosa without bleeding was found in the       gastric antrum. Biopsies were taken with a cold forceps for Helicobacter       pylori testing.      The examined duodenum was  normal.      A small amount of food (residue) was found in the gastric body. Impression:               - Tortuous esophagus.                           - Z-line variable, 38 cm from the incisors.                            Biopsied.                           - A fundoplication was found. The wrap appears                            intact.                           - Erythematous mucosa in the antrum. Biopsied.                           - Normal examined duodenum.                           - A small amount of food  (residue) in the stomach. Moderate Sedation:      Patient did not receive moderate sedation for this procedure, but       instead received monitored anesthesia care. Recommendation:           - Await pathology results.                           - Patient has missing teeth and dentures likely                            contributing to dysphagia along with an intact                            fundoplication.                           - Recommend mechanical soft diet. Procedure Code(s):        --- Professional ---                           [phone] [id number], Esophagogastroduodenoscopy, flexible,                            transoral; with biopsy, single or multiple Diagnosis Code(s):        --- Professional ---                           Q39.9, Congenital malformation of esophagus,                            unspecified  K22.8, Other specified diseases of esophagus                           Z98.890, Other specified postprocedural states                           K31.89, Other diseases of stomach and duodenum                           R13.10, Dysphagia, unspecified CPT copyright 2019 American Medical Association. All rights reserved. The codes documented in this report are preliminary and upon coder review may  be revised to meet current compliance requirements. Ronnette Juniper, MD 09/06/2018 2:13:51 PM This report has been signed electronically. Number of Addenda: 0

## 2018-09-06 NOTE — Consult Note (Signed)
   [facility] CM Inpatient Consult   09/06/2018  MARGARETTA CHITTUM 31-Oct-1945 [id number]    Patient screened for possible [facility] care management service needs for 24% high risk score for unplanned readmission, with 3 hospitalizations and 2 ED visits in the past 6 months, as a benefit of her [facility] plan.  Chart review and MD's brief narrative on 09/06/18 reveal as follows:  Ms.Ellyana A Bearsis a 73 y.o.femalewith medical history significant ofParkinson's disease, Alzheimer's dementia, bipolar disorder, GERD, hypertension, hypothyroidism who was brought in from skilled facility after choking while eating. Patient apparently has had this before but today it was significant. She was turning blue. Heimlich maneuver was tried. A piece of chicken was removed from her throat. Patient was brought to the ER. In the ED, patient became hypoxic with oxygen sat dropping into the 70s. She was placed on oxygen by nasal and admitted for further evaluation of possible aspiration pneumonia and also evaluation for severe dysphagia possibly with GI intervention. (Food impaction of esophagus, dysphagia, possible UTI) Primary care provider is Dr. Estill Bamberg. Megan Salon. Review of transition of care CM/ SW note reveals that patient is   LTC (long term care) resident at [facility], and had confirmed with daughter that plan is to return back to SNF (skilled nursing facility) at discharge. No further [facility] care management needs identified at this time.   For questions and additional information, please call:  Tanaia Hawkey A. Camilia Caywood, BSN, RN-BC [facility] Liaison Cell: [phone]

## 2018-09-07 ENCOUNTER — Encounter (HOSPITAL_COMMUNITY): Payer: Self-pay | Admitting: *Deleted

## 2018-09-07 LAB — URINE CULTURE: Culture: <10000 — AB

## 2018-09-07 NOTE — Progress Notes (Addendum)
spokr to  Daughter, Berneda Rose,  with update

## 2018-09-07 NOTE — Plan of Care (Signed)
  Problem: Education: Goal: Knowledge of General Education information will improve Description Including pain rating scale, medication(s)/side effects and non-pharmacologic comfort measures Outcome: Progressing   

## 2018-09-07 NOTE — Progress Notes (Signed)
PROGRESS NOTE  Jessica HumphreyCarolyn A Hayes EAV:[id number] DOB: 01/16/1946 DOA: 09/05/2018 PCP: [name], Stephen D., MD   LOS: 2 days   Brief narrative: Patient is a 73 y.o.femalewith medical history significant ofParkinson's disease, Alzheimer's dementia, bipolar disorder, GERD, hypertension, hypothyroidism who was brought in from skilled facility after choking while eating. Patient apparently has had this before but this time it was significant. She was turning blue. Heimlich maneuver was tried. A piece of chicken was removed from her throat. Patient was brought to the ER.  In the ED, patient became hypoxic with oxygen sat dropping into the 70s. She was placed on oxygen by nasal and admitted for further evaluation.   ED Course:Temperature is98.4 with blood pressure 150/98. Pulse 93 respiratory 26 oxygen sat currently 92% on 2 L. She has a white count of 7.2 hemoglobin 14.3 platelets 175. Sodium 136 potassium 4.3 chloride 104 CO2 21 BUN 18 creatinine 1.06 and calcium 8.6. Glucose is 192. Urinalysis so far negative. Chest x-ray showed no active disease. CT angiogram of the chest showed no acute findings with scattered tiny bilateral pulmonary nodules. Aortic atherosclerosis. Patient was admitted for evaluation of chronic dysphagia as well as sudden hypoxia.  Patient underwent EGD on 6/19.  Subjective: Patient was seen and examined this morning.  Elderly Caucasian female, propped up in bed.  Not in distress.  Complains of some burning and lower abdominal pain.  Assessment/Plan:  Principal Problem:   Food impaction of esophagus Active Problems:   Dehydration   Alzheimer's dementia (HCC)   Essential hypertension   Hyperlipidemia   Hypothyroidism   Persistent atrial fibrillation   Acute lower UTI  #1 Food impaction:This has resolved. Appears patient has chronic dysphagia.   #2 dysphagia: Underwent EGD which showed tortuous mid and distal esophagus, biopsies taken to rule out  eosinophilic esophagitis. Variable Z line at 38 cm,biopsies taken to rule out Barrett's. Intact fundoplication noted. Mild amount of retained food in gastric cavity. Mild antral gastritis, biopsies taken for H. Pylori. So, her dysphagia is likely worsened with missing teeth and dentures along with tortuous esophagus and an intact fundoplication. No obvious luminal narrowing or stricture noted. Biopsiesmay be followed as an outpatient.  #3 possible UTI: -Complains of burning urination as well as lower abdominal pain.  Urinalysis obtained on admission showed positive nitrite, leukocyte esterase, WBCs and bacteria.  IV Rocephin was empirically started on admission. Urine culture sent.  #4 Parkinson's disease:Resume home regimen.  #5 Alzheimer's dementia:Appears to be at her baseline. No confusion at this time.  #6 hypothyroidism: Continue with levothyroxine.  #7 history of atrial fibrillation:Patient appears to be in sinus rhythm now but rate is controlled.  Mobility: Encourage ambulation Diet: Mechanical soft diet DVT prophylaxis: Heparin subcu Code Status:  Code Status: DNR  Family Communication: Expected Discharge:  Pending urine culture report.  Consultants:  GI  Procedures:  EGD today.  Antimicrobials:  Anti-infectives (From admission, onward)   Start     Dose/Rate Route Frequency Ordered Stop   09/06/18 0100  cefTRIAXone (ROCEPHIN) 1 g in sodium chloride 0.9 % 100 mL IVPB     1 g 200 mL/hr over 30 Minutes Intravenous Every 24 hours 09/06/18 0015        Infusions:  . cefTRIAXone (ROCEPHIN)  IV Stopped (09/07/18 0145)  . dextrose 5 % and 0.9% NaCl 100 mL/hr at 09/07/18 0532    Scheduled Meds: . aspirin EC  81 mg Oral Daily  . baclofen  10 mg Oral QHS  . carbidopa-levodopa  1 tablet Oral TID  . carvedilol  6.25 mg Oral BID WC  . chlorhexidine  15 mL Mouth Rinse BID  . escitalopram  15 mg Oral Daily  . fenofibrate  54 mg Oral Daily  . heparin   5,000 Units Subcutaneous Q8H  . levothyroxine  50 mcg Oral QAC breakfast  . OLANZapine  15 mg Oral QHS  . oxybutynin  10 mg Oral QHS  . pantoprazole  40 mg Oral Daily  . rOPINIRole  0.5 mg Oral QHS  . saccharomyces boulardii  250 mg Oral Daily  . temazepam  7.5 mg Oral QHS    PRN meds: acetaminophen, furosemide, lidocaine, nitroGLYCERIN, ondansetron **OR** ondansetron (ZOFRAN) IV   Objective: Vitals:   09/07/18 0442 09/07/18 1410  BP: 121/72 (!) 125/51  Pulse: (!) 59 67  Resp: 19 13  Temp: 97.7 F (36.5 C) 97.8 F (36.6 C)  SpO2: 100% 99%    Intake/Output Summary (Last 24 hours) at 09/07/2018 1559 Last data filed at 09/07/2018 1300 Gross per 24 hour  Intake 2233.57 ml  Output 300 ml  Net 1933.57 ml   Filed Weights   09/06/18 1241  Weight: 73.5 kg   Weight change:  Body mass index is 27.81 kg/m.   Physical Exam: General exam: Appears calm and comfortable.  Skin: No rashes, lesions or ulcers. HEENT: Atraumatic, normocephalic, supple neck, no obvious bleeding Lungs: Clear to auscultation bilaterally CVS: Regular rate and rhythm, no murmur GI/Abd soft, minimal tenderness in lower abdomen, bowel sound present CNS: Alert, awake, oriented to place and person, and struggles with time Psychiatry: Mood appropriate Extremities: No pedal edema, no calf tenderness  Data Review: I have personally reviewed the laboratory data and studies available.  Recent Labs  Lab 09/05/18 1843 09/06/18 0222  WBC 7.2 8.9  NEUTROABS 4.0  --   HGB 14.3 14.5  HCT 44.3 44.2  MCV 89.9 88.2  PLT 175 127*   Recent Labs  Lab 09/05/18 1843 09/06/18 0222  NA 136 138  K 4.6 4.7  CL 105 107  CO2 21* 23  GLUCOSE 192* 117*  BUN 18 16  CREATININE 1.06* 0.91  CALCIUM 8.6* 8.9    Terrilee Croak, MD  [facility] 09/07/2018

## 2018-09-07 NOTE — TOC Progression Note (Signed)
Transition of Care [facility]) - Progression Note    Patient Details  Name: Jessica Hayes MRN: [id number] Date of Birth: 1945/09/25  [facility] CM/SW [place], [place] Phone Number: 09/07/2018, 8:35 AM  Clinical Narrative:    Await results of COVID test prior to dc back to [place].   Expected Discharge Plan: [place] Barriers to Discharge: Continued Medical Work up  Expected Discharge Plan and Services Expected Discharge Plan: [place] In-house Referral: Clinical Social Work   Post Acute Care Choice: [place] Living arrangements for the past 2 months: [place] Expected Discharge Date: 09/10/18                    Social Determinants of Health (SDOH) Interventions    Readmission Risk Interventions No flowsheet data found.

## 2018-09-08 ENCOUNTER — Encounter (HOSPITAL_COMMUNITY): Payer: Self-pay | Admitting: Gastroenterology

## 2018-09-08 LAB — NOVEL CORONAVIRUS, NAA (HOSP ORDER, SEND-OUT TO REF LAB; TAT 18-24 HRS): SARS-CoV-2, NAA: NOT DETECTED

## 2018-09-08 NOTE — Progress Notes (Signed)
PROGRESS NOTE  Jessica HumphreyCarolyn A Ramanathan [id number] DOB: [birth year] DOA: 09/05/2018 PCP: [name], Stephen D., MD   LOS: 3 days   Brief narrative: [name] a 73 y.o.femalewith medical history significant ofParkinson's disease, Alzheimer's dementia, bipolar disorder, GERD, hypertension, hypothyroidism who was brought in from skilled facility after choking while eating. Patient apparently has had this before butthis timeit was significant. She was turning blue. Heimlich maneuver was tried. A piece of chicken was removed from her throat. Patient was brought to the ER.  In the ED, patientbecame hypoxic with oxygen sat dropping into the 70s. Shewasplaced on oxygen by nasal and admitted for further evaluation.  ED Course:Temperature is98.4 with blood pressure 150/98. Pulse 93 respiratory 26 oxygen sat currently 92% on 2 L.  She had a white count of 7.2 hemoglobin 14.3 platelets 175. Sodium 136 potassium 4.3 chloride 104 CO2 21 BUN 18 creatinine 1.06 and calcium 8.6. Glucose is 192. Urinalysis obtained on admission showed positive nitrite,leukocyteesterase,WBCs and bacteria. Chest x-ray showed no active disease.  CT angiogram of the chest showed no acute findings with scattered tiny bilateral pulmonary nodules.  Patientwasadmittedfor evaluation of chronic dysphagia, sudden hypoxia, UTI.  Patient underwent EGD on 6/19.  Subjective: Patient was seen and examined this morning.  Pleasant elderly Caucasian female, propped up in bed.  Not in distress.  Resting tremors present.  Continues to have some soreness in the lower abdomen.  She states she always has UTI and is on antibiotics at this time.  Assessment/Plan:  Principal Problem:   Food impaction of esophagus Active Problems:   Dehydration   Alzheimer's dementia (HCC)   Essential hypertension   Hyperlipidemia   Hypothyroidism   Persistent atrial fibrillation   Acute lower UTI  #1 Food impaction:This has resolved.  Appears patient has chronic dysphagia.   #2 dysphagia:Underwent EGD which showedtortuousmid and distal esophagus, biopsies taken to rule out eosinophilic esophagitis. Variable Z line at 38 cm,biopsies taken to rule out Barrett's. Intact fundoplication noted. Mild amount of retained food in gastric cavity. Mild antral gastritis, biopsies taken for H. Pylori. So, her dysphagia is likely worsened with missing teeth and dentures along with tortuous esophagus and an intact fundoplication. No obvious luminal narrowing or stricture noted. Biopsiesmay be followed as an outpatient.  #3 UTI: -Complains of burning urination as well as lower abdominal pain. Urinalysis obtained on admission showed positive nitrite,leukocyteesterase,WBCs and bacteria.IV Rocephin was empirically started on admission.Urine culture sent.  #4 Sudden transient hypoxia -Patient had one episode of desaturation to 70s in the ED.  Probably had transient aspiration.  No evidence of pneumonia and chest x-ray and CT chest.  No fever or respiratory distress on admission.  Currently not on oxygen supplementation.  #5 Alzheimer's dementia:Appears to be at her baseline. No confusion at thistime.  #6 hypothyroidism: Continue with levothyroxine.  #7 history of atrial fibrillation:Patient appears to be in sinus rhythm now but rate is controlled.  #8 Parkinson's disease:Resume home regimen.  Mobility: Encourage ambulation Diet: Mechanical soft diet DVT prophylaxis: Heparin subcu Code Status:  Code Status: DNR  Family Communication: Expected Discharge: Plan to discharge to nursing facility tomorrow  Consultants:  GI  Procedures:  EGD on 6/19  Antimicrobials:  Anti-infectives (From admission, onward)   Start     Dose/Rate Route Frequency Ordered Stop   09/06/18 0100  cefTRIAXone (ROCEPHIN) 1 g in sodium chloride 0.9 % 100 mL IVPB     1 g 200 mL/hr over 30 Minutes Intravenous Every 24 hours  09/06/18 0015  Infusions:  . cefTRIAXone (ROCEPHIN)  IV Stopped (09/08/18 0220)  . dextrose 5 % and 0.9% NaCl 100 mL/hr at 09/08/18 0416    Scheduled Meds: . aspirin EC  81 mg Oral Daily  . baclofen  10 mg Oral QHS  . carbidopa-levodopa  1 tablet Oral TID  . carvedilol  6.25 mg Oral BID WC  . chlorhexidine  15 mL Mouth Rinse BID  . escitalopram  15 mg Oral Daily  . fenofibrate  54 mg Oral Daily  . heparin  5,000 Units Subcutaneous Q8H  . levothyroxine  50 mcg Oral QAC breakfast  . OLANZapine  15 mg Oral QHS  . oxybutynin  10 mg Oral QHS  . pantoprazole  40 mg Oral Daily  . rOPINIRole  0.5 mg Oral QHS  . saccharomyces boulardii  250 mg Oral Daily  . temazepam  7.5 mg Oral QHS    PRN meds: acetaminophen, furosemide, lidocaine, nitroGLYCERIN, ondansetron **OR** ondansetron (ZOFRAN) IV   Objective: Vitals:   09/07/18 2019 09/08/18 0422  BP: 134/65 138/70  Pulse: 64 60  Resp: 18 18  Temp: 97.7 F (36.5 C) 97.8 F (36.6 C)  SpO2: 100% 100%    Intake/Output Summary (Last 24 hours) at 09/08/2018 0951 Last data filed at 09/08/2018 0416 Gross per 24 hour  Intake 2827.73 ml  Output 1100 ml  Net 1727.73 ml   Filed Weights   09/06/18 1241  Weight: 73.5 kg   Weight change:  Body mass index is 27.81 kg/m.   Physical Exam: General exam: Appears calm and comfortable.  Skin: No rashes, lesions or ulcers. HEENT: Atraumatic, normocephalic, supple neck, no obvious bleeding Lungs: Clear to auscultation bilaterally CVS: Regular rate and rhythm, no murmur GI/Abd soft, nontender, nondistended, bowel stimulant CNS: Alert, awake, oriented to place and person, resting tremors present Psychiatry: Mood appropriate Extremities: No pedal edema, no calf tenderness  Data Review: I have personally reviewed the laboratory data and studies available.  Recent Labs  Lab 09/05/18 1843 09/06/18 0222  WBC 7.2 8.9  NEUTROABS 4.0  --   HGB 14.3 14.5  HCT 44.3 44.2  MCV 89.9 88.2   PLT 175 127*   Recent Labs  Lab 09/05/18 1843 09/06/18 0222  NA 136 138  K 4.6 4.7  CL 105 107  CO2 21* 23  GLUCOSE 192* 117*  BUN 18 16  CREATININE 1.06* 0.91  CALCIUM 8.6* 8.9    Jessica Croak, MD  [facility] 09/08/2018

## 2018-09-09 DIAGNOSIS — T18128D Food in esophagus causing other injury, subsequent encounter: Secondary | ICD-10-CM

## 2018-09-09 MED ORDER — SACCHAROMYCES BOULARDII 250 MG PO CAPS: 250.0000 mg | ORAL_CAPSULE | Freq: Every day | ORAL | 0 refills | Status: AC

## 2018-09-09 MED ORDER — ACETAMINOPHEN ER 650 MG PO TBCR: 650.0000 mg | EXTENDED_RELEASE_TABLET | Freq: Four times a day (QID) | ORAL | Status: AC | PRN

## 2018-09-09 MED ORDER — CEPHALEXIN 500 MG PO CAPS: 500.0000 mg | ORAL_CAPSULE | Freq: Four times a day (QID) | ORAL | 0 refills | Status: AC

## 2018-09-09 NOTE — Progress Notes (Signed)
2nd Attempt made to give report to [facility]; No answer. [facility] has arrived to receive patient. Pt to be discharged.

## 2018-09-09 NOTE — Progress Notes (Signed)
2 Attempts made to give report to Nurse at [facility]; No answer from the facility.

## 2018-09-09 NOTE — Social Work (Signed)
Clinical Social Worker facilitated patient discharge including contacting patient family and facility to confirm patient discharge plans.  Clinical information faxed to facility and family agreeable with plan.  CSW arranged ambulance transport via [facility] to [place] at 1:30pm RN to call [phone] with report prior to discharge.  Clinical Social Worker will sign off for now as social work intervention is no longer needed. Please consult [place] again if new need arises.  Westley Hummer, MSW, [place] Social Worker [phone]

## 2018-09-09 NOTE — Progress Notes (Signed)
Attempt made to give report at 1318; Will attempt report again at a later time.

## 2018-09-09 NOTE — TOC Transition Note (Signed)
Transition of Care [facility]) - CM/SW Discharge Note   Patient Details  Name: Jessica Hayes MRN: [id number] Date of Birth: 1945-07-21  Transition of [facility]) CM/SW Contact:  Alexander Mt, [place] Phone Number: 09/09/2018, 10:11 AM   Clinical Narrative:    Per morning rounds pt stable for dc; COVID negative result on 6/21. Paged MD; if stable we will need dc summary; order; and any controlled prescriptions signed and printed.   Pt is LTC resident at [facility].   Final next level of care: Skilled Nursing Facility Barriers to Discharge: Barriers Resolved   Patient Goals and CMS Choice Patient states their goals for this hospitalization and ongoing recovery are:: to return to SNF CMS Medicare.gov Compare Post Acute Care list provided to:: Patient Choice offered to / list presented to : Patient, Adult Children  Discharge Placement  Patient chooses bed at: Other - please specify in the comment section below:([facility]) Patient to be transferred to facility by: [place] Name of family member notified: pt daughter Patient and family notified of of transfer: 09/09/18  Discharge Plan and Services In-house Referral: Clinical Social Work   Post Acute Care Choice: [place]                               Social Determinants of Health (SDOH) Interventions     Readmission Risk Interventions Readmission Risk Prevention Plan 09/09/2018  Transportation Screening Complete  PCP or Specialist Appt within 3-5 Days Complete  HRI or [place] Not Complete  HRI or Home Care Consult comments pt is LTC resident  Social Work Consult for Christian Planning/Counseling Complete  Palliative Care Screening Not Applicable  Medication Review Press photographer) Complete  Some recent data might be hidden

## 2018-09-09 NOTE — Discharge Summary (Signed)
Physician Discharge Summary  Jessica HumphreyCarolyn A Federer ZOX:[id number] DOB: April 25, 1945 DOA: 09/05/2018  PCP: [name], Stephen D., MD  Admit date: 09/05/2018 Discharge date: 09/09/2018  Admitted From: SNF Discharge disposition: SNF   Code Status: DNR   Recommendations for Outpatient Follow-Up:   1. Follow-up with PCP  Discharge Diagnosis:   Principal Problem:   Food impaction of esophagus Active Problems:   Dehydration   Alzheimer's dementia (HCC)   Essential hypertension   Hyperlipidemia   Hypothyroidism   Persistent atrial fibrillation   Acute lower UTI  History of Present Illness / Brief narrative:  [name] a 73 y.o.femalewith medical history significant ofParkinson's disease, Alzheimer's dementia, bipolar disorder, GERD, hypertension, hypothyroidism who was brought in from skilled facility after choking while eating. Patient apparently has had this before butthis timeit was significant. She was turning blue. Heimlich maneuver was tried. A piece of chicken was removed from her throat. Patient was brought to the ER.  In the ED, patientbecame hypoxic with oxygen sat dropping into the 70s. Shewasplaced on oxygen by nasal and admitted for further evaluation.  ED Course:Temperature is98.4 with blood pressure 150/98. Pulse 93 respiratory 26 oxygen sat currently 92% on 2 L.  She hada white count of 7.2 hemoglobin 14.3 platelets 175. Sodium 136 potassium 4.3 chloride 104 CO2 21 BUN 18 creatinine 1.06 and calcium 8.6. Glucose is 192. Urinalysis obtained on admission showed positive nitrite,leukocyteesterase,WBCs and bacteria. Chest x-ray showed no active disease.  CT angiogram of the chest showed no acute findings with scattered tiny bilateral pulmonary nodules.  Patientwasadmittedfor evaluation of chronic dysphagia, sudden hypoxia, UTI.  Patient underwent EGD on 6/19.  Hospital Course:   #1 Food impaction:This has resolved. Appears patient has chronic  dysphagia.   #2 dysphagia:Underwent EGD which showedtortuousmid and distal esophagus, biopsies taken to rule out eosinophilic esophagitis. Variable Z line at 38 cm,biopsies taken to rule out Barrett's. Intact fundoplication noted. Mild amount of retained food in gastric cavity. Mild antral gastritis, biopsies taken for H. Pylori. So, her dysphagia is likely worsened with missing teeth and dentures along with tortuous esophagus and an intact fundoplication. No obvious luminal narrowing or stricture noted. Biopsiesmay be followed as an outpatient.  #3 UTI: -Complains of burning urination as well as lower abdominal pain. Urinalysis obtained on admission showed positive nitrite,leukocyteesterase,WBCs and bacteria.IV Rocephin was empirically started on admission.Urine culture showed less than 10,000 CFU per mL of growth..  However patient is clearly symptomatic.  We will discharge her on Keflex for next 5 days.    #4Sudden transient hypoxia -Patient had one episode of desaturation to 70s in the ED. Probably had transient aspiration. No evidence of pneumonia and chest x-ray and CT chest. No fever or respiratory distress on admission. Currently not on oxygen supplementation.  #5 Alzheimer's dementia:Appears to be at her baseline. No confusion at thistime.  #6 hypothyroidism: Continue with levothyroxine.  #7 history of atrial fibrillation:Patient appears to be in sinus rhythm now but rate is controlled.  #8Parkinson's disease:Resume home regimen.  Stable for discharge back to skilled nursing facility today.  Subjective:  Patient was seen and examined this morning.  Elderly Caucasian female.  Not in distress.  Sitting up in chair.  Ready to go to nursing home.  Discharge Exam:   Vitals:   09/08/18 0422 09/08/18 1435 09/08/18 2057 09/09/18 0532  BP: 138/70 (!) 144/71 (!) 149/100 129/88  Pulse: 60 66 69 (!) 58  Resp: 18 18 18 20   Temp: 97.8 F (36.6 C) 97.7 F  (36.5  C)  (!) 97.5 F (36.4 C)  TempSrc: Oral Oral  Oral  SpO2: 100% 97% 98% 98%  Weight:      Height:        Body mass index is 27.81 kg/m.  General exam: Appears calm and comfortable.  Skin: No rashes, lesions or ulcers. HEENT: Atraumatic, normocephalic, supple neck, no obvious bleeding Lungs: Clear to auscultation bilaterally CVS: Regular rate and rhythm, no murmur GI/Abd soft, nontender, nondistended CNS: Alert, awake, oriented x3, resting tremors from parkinsonism present Psychiatry: Depressed affect Extremities: No pedal edema, no calf tenderness  Discharge Instructions:  Wound care: None Discharge Instructions    Diet - low sodium heart healthy   Complete by: As directed    Increase activity slowly   Complete by: As directed       Allergies as of 09/09/2018      Reactions   Demerol [meperidine Hcl] Other (See Comments)   Unknown reaction - per [facility]      Medication List    TAKE these medications   acetaminophen 650 MG CR tablet Commonly known as: TYLENOL Take 1 tablet (650 mg total) by mouth every 6 (six) hours as needed for pain. What changed:   how much to take  when to take this   aspirin EC 81 MG tablet Take 81 mg by mouth daily.   baclofen 10 MG tablet Commonly known as: LIORESAL Take 10 mg by mouth at bedtime.   carbidopa-levodopa 25-100 MG tablet Commonly known as: SINEMET IR Take 1 tablet by mouth 3 (three) times daily.   carvedilol 6.25 MG tablet Commonly known as: COREG Take 1 tablet (6.25 mg total) by mouth 2 (two) times daily with a meal.   cephALEXin 500 MG capsule Commonly known as: KEFLEX Take 1 capsule (500 mg total) by mouth 4 (four) times daily for 5 days.   escitalopram 5 MG tablet Commonly known as: LEXAPRO Take 15 mg by mouth daily.   fenofibrate 48 MG tablet Commonly known as: TRICOR Take 48 mg by mouth daily.   furosemide 20 MG tablet Commonly known as: LASIX Take 1 tablet (20 mg total) by mouth as needed for  edema. What changed: when to take this   LACTOBACILLUS PO Take 1 capsule by mouth daily.   levothyroxine 50 MCG tablet Commonly known as: SYNTHROID Take 50 mcg by mouth daily before breakfast.   lidocaine 5 % Commonly known as: Lidoderm Place 1 patch onto the skin daily. Remove & Discard patch within 12 hours or as directed by MD What changed:   when to take this  reasons to take this   nitroGLYCERIN 0.4 MG SL tablet Commonly known as: NITROSTAT Place 0.4 mg under the tongue every 5 (five) minutes as needed for chest pain.   OLANZapine 15 MG tablet Commonly known as: ZYPREXA Take 15 mg by mouth at bedtime.   oxybutynin 10 MG 24 hr tablet Commonly known as: DITROPAN-XL Take 10 mg by mouth at bedtime.   pantoprazole 40 MG tablet Commonly known as: PROTONIX Take 1 tablet (40 mg total) by mouth daily.   rOPINIRole 0.5 MG tablet Commonly known as: REQUIP Take 0.5 mg by mouth at bedtime.   saccharomyces boulardii 250 MG capsule Commonly known as: FLORASTOR Take 1 capsule (250 mg total) by mouth daily for 5 days. Start taking on: September 10, 2018   temazepam 7.5 MG capsule Commonly known as: RESTORIL Take 1 capsule (7.5 mg total) by mouth at bedtime.  Time coordinating discharge: 35 minutes  The results of significant diagnostics from this hospitalization (including imaging, microbiology, ancillary and laboratory) are listed below for reference.    Procedures and Diagnostic Studies:   Ct Chest W Contrast  Result Date: 09/05/2018 CLINICAL DATA:  Chest pain.  Patient choked on a piece of chicken. EXAM: CT CHEST WITH CONTRAST TECHNIQUE: Multidetector CT imaging of the chest was performed during intravenous contrast administration. CONTRAST:  75mL OMNIPAQUE IOHEXOL 300 MG/ML  SOLN COMPARISON:  04/08/2018 FINDINGS: Cardiovascular: The heart size is normal. No substantial pericardial effusion. Coronary artery calcification is evident. Atherosclerotic calcification is  noted in the wall of the thoracic aorta. Mediastinum/Nodes: 1.8 x 2.5 cm calcified nodule in the left thoracic inlet is stable since the prior study and comparing back to a scan from 04/25/2017. No mediastinal lymphadenopathy. There is no hilar lymphadenopathy. The esophagus has normal imaging features. There is no axillary lymphadenopathy. Lungs/Pleura: 5 mm posterior right apical nodule (23/4) stable since 04/25/2017, consistent with benign etiology. 4 mm posterior right upper lobe nodule is also stable. 5 mm right middle lobe nodule has progressed since 04/08/2018 and is new since 04/25/2017. Other tiny bilateral pulmonary nodules are stable. No pulmonary edema. No pleural effusion. Probable atelectasis in the dependent lung bases. Upper Abdomen: Unremarkable. Musculoskeletal: No worrisome lytic or sclerotic osseous abnormality. IMPRESSION: 1. No acute findings. 2. Scattered tiny bilateral pulmonary nodules. One of these has been progressive since 04/25/2017, now measuring up to 5 mm in the right middle lobe. Consider follow-up CT in 6-12 months to ensure stability. 3.  Aortic Atherosclerois (ICD10-170.0) Electronically Signed   By: Kennith Center M.D.   On: 09/05/2018 20:22   Dg Chest Portable 1 View  Result Date: 09/05/2018 CLINICAL DATA:  Shortness of breath EXAM: PORTABLE CHEST 1 VIEW COMPARISON:  08/12/2018, 04/11/2018 FINDINGS: Left-sided pacing device as before. Chronic elevation of right diaphragm. No acute opacity. Stable cardiomediastinal silhouette with aortic atherosclerosis. IMPRESSION: No active disease. Electronically Signed   By: Jasmine Pang M.D.   On: 09/05/2018 19:30     Labs:   Basic Metabolic Panel: Recent Labs  Lab 09/05/18 1843 09/06/18 0222  NA 136 138  K 4.6 4.7  CL 105 107  CO2 21* 23  GLUCOSE 192* 117*  BUN 18 16  CREATININE 1.06* 0.91  CALCIUM 8.6* 8.9   GFR Estimated Creatinine Clearance: 53.3 mL/min (by C-G formula based on SCr of 0.91 mg/dL). Liver Function  Tests: Recent Labs  Lab 09/06/18 0222  AST 37  ALT 31  ALKPHOS 151*  BILITOT 0.9  PROT 6.3*  ALBUMIN 3.3*   No results for input(s): LIPASE, AMYLASE in the last 168 hours. No results for input(s): AMMONIA in the last 168 hours. Coagulation profile No results for input(s): INR, PROTIME in the last 168 hours.  CBC: Recent Labs  Lab 09/05/18 1843 09/06/18 0222  WBC 7.2 8.9  NEUTROABS 4.0  --   HGB 14.3 14.5  HCT 44.3 44.2  MCV 89.9 88.2  PLT 175 127*   Cardiac Enzymes: No results for input(s): CKTOTAL, CKMB, CKMBINDEX, TROPONINI in the last 168 hours. BNP: Invalid input(s): POCBNP CBG: No results for input(s): GLUCAP in the last 168 hours. D-Dimer No results for input(s): DDIMER in the last 72 hours. Hgb A1c No results for input(s): HGBA1C in the last 72 hours. Lipid Profile No results for input(s): CHOL, HDL, LDLCALC, TRIG, CHOLHDL, LDLDIRECT in the last 72 hours. Thyroid function studies No results for input(s): TSH, T4TOTAL, T3FREE,  THYROIDAB in the last 72 hours.  Invalid input(s): FREET3 Anemia work up No results for input(s): VITAMINB12, FOLATE, FERRITIN, TIBC, IRON, RETICCTPCT in the last 72 hours. Microbiology Recent Results (from the past 240 hour(s))  SARS Coronavirus 2     Status: None   Collection Time: 09/05/18  6:48 PM  Result Value Ref Range Status   SARS Coronavirus 2 NOT DETECTED NOT DETECTED Final    Comment: (NOTE) SARS-CoV-2 target nucleic acids are NOT DETECTED. The SARS-CoV-2 RNA is generally detectable in upper and lower respiratory specimens during the acute phase of infection.  Negative  results do not preclude SARS-CoV-2 infection, do not rule out co-infections with other pathogens, and should not be used as the sole basis for treatment or other patient management decisions.  Negative results must be combined with clinical observations, patient history, and epidemiological information. The expected result is Not Detected. Fact Sheet  for Patients: http://www.biofiredefense.com/wp-content/uploads/2020/03/BIOFIRE-COVID -19-patients.pdf Fact Sheet for Healthcare Providers: http://www.biofiredefense.com/wp-content/uploads/2020/03/BIOFIRE-COVID -19-hcp.pdf This test is not yet approved or cleared by the [place] FDA and  has been authorized for detection and/or diagnosis of SARS-CoV-2 by FDA under an Emergency Use Authorization (EUA).  This EUA will remain in effec t (meaning this test can be used) for the duration of  the COVID-19 declaration under Section 564(b)(1) of the Act, 21 U.S.C. section 360bbb-3(b)(1), unless the authorization is terminated or revoked sooner. Performed at [facility], [address] [place], [place] [address]   MRSA PCR Screening     Status: None   Collection Time: 09/06/18  8:08 AM   Specimen: Nasal Mucosa; Nasopharyngeal  Result Value Ref Range Status   MRSA by PCR NEGATIVE NEGATIVE Final    Comment:        The GeneXpert MRSA Assay (FDA approved for NASAL specimens only), is one component of a comprehensive MRSA colonization surveillance program. It is not intended to diagnose MRSA infection nor to guide or monitor treatment for MRSA infections. Performed at [facility], [address] [place], [place] [address]   Urine Culture     Status: Abnormal   Collection Time: 09/06/18  6:39 PM   Specimen: Urine, Random  Result Value Ref Range Status   Specimen Description URINE, RANDOM  Final   Special Requests NONE  Final   Culture (A)  Final    <10,000 COLONIES/mL INSIGNIFICANT GROWTH Performed at [facility], [address] [place], [place] [address]    Report Status 09/07/2018 FINAL  Final  Novel Coronavirus, NAA (hospital order; send-out to ref lab)     Status: None   Collection Time: 09/06/18  9:03 PM   Specimen: Nasopharyngeal Swab; Respiratory  Result Value Ref Range Status   SARS-CoV-2, NAA NOT DETECTED NOT DETECTED Final    Comment:  (NOTE) This test was developed and its performance characteristics determined by [facility]. This test has not been FDA cleared or approved. This test has been authorized by FDA under an Emergency Use Authorization (EUA). This test is only authorized for the duration of time the declaration that circumstances exist justifying the authorization of the emergency use of in vitro diagnostic tests for detection of SARS-CoV-2 virus and/or diagnosis of COVID-19 infection under section 564(b)(1) of the Act, 21 U.S.C. 643PIR-5(J)(8360bbb-3(b)(1), unless the authorization is terminated or revoked sooner. When diagnostic testing is negative, the possibility of a false negative result should be considered in the context of a patient's recent exposures and the presence of clinical signs and  symptoms consistent with COVID-19. An individual without symptoms of COVID-19 and who is not shedding SARS-CoV-2 virus would expect to have a negative (not detected) result in this assay. Performed  At: [facility] [place] [address] [place], [place] [id number] Jolene SchimkeNagendra Sanjai MD OZ:[id number]:[phone]    Coronavirus Source NASOPHARYNGEAL  Final    Comment: Performed at [facility], [address] [place], [place] [address]    Signed: Lorin GlassBinaya Sian Joles  [facility] 09/09/2018, 10:58 AM

## 2018-09-10 DIAGNOSIS — K219 Gastro-esophageal reflux disease without esophagitis: Secondary | ICD-10-CM | POA: Diagnosis not present

## 2018-09-10 DIAGNOSIS — R1310 Dysphagia, unspecified: Secondary | ICD-10-CM | POA: Diagnosis not present

## 2018-09-10 DIAGNOSIS — G20 Parkinson's disease: Secondary | ICD-10-CM | POA: Diagnosis not present

## 2018-09-10 DIAGNOSIS — R41841 Cognitive communication deficit: Secondary | ICD-10-CM | POA: Diagnosis not present

## 2018-09-10 DIAGNOSIS — G300 Alzheimer's disease with early onset: Secondary | ICD-10-CM | POA: Diagnosis not present

## 2018-09-11 DIAGNOSIS — G300 Alzheimer's disease with early onset: Secondary | ICD-10-CM | POA: Diagnosis not present

## 2018-09-11 DIAGNOSIS — G20 Parkinson's disease: Secondary | ICD-10-CM | POA: Diagnosis not present

## 2018-09-11 DIAGNOSIS — R41841 Cognitive communication deficit: Secondary | ICD-10-CM | POA: Diagnosis not present

## 2018-09-11 DIAGNOSIS — R1310 Dysphagia, unspecified: Secondary | ICD-10-CM | POA: Diagnosis not present

## 2018-09-11 DIAGNOSIS — K219 Gastro-esophageal reflux disease without esophagitis: Secondary | ICD-10-CM | POA: Diagnosis not present

## 2018-09-13 DIAGNOSIS — G20 Parkinson's disease: Secondary | ICD-10-CM | POA: Diagnosis not present

## 2018-09-13 DIAGNOSIS — B9620 Unspecified Escherichia coli [E. coli] as the cause of diseases classified elsewhere: Secondary | ICD-10-CM | POA: Diagnosis not present

## 2018-09-13 DIAGNOSIS — F0390 Unspecified dementia without behavioral disturbance: Secondary | ICD-10-CM | POA: Diagnosis not present

## 2018-09-13 DIAGNOSIS — N390 Urinary tract infection, site not specified: Secondary | ICD-10-CM | POA: Diagnosis not present

## 2018-09-16 DIAGNOSIS — G20 Parkinson's disease: Secondary | ICD-10-CM | POA: Diagnosis not present

## 2018-09-16 DIAGNOSIS — K219 Gastro-esophageal reflux disease without esophagitis: Secondary | ICD-10-CM | POA: Diagnosis not present

## 2018-09-16 DIAGNOSIS — F419 Anxiety disorder, unspecified: Secondary | ICD-10-CM | POA: Diagnosis not present

## 2018-09-16 DIAGNOSIS — G300 Alzheimer's disease with early onset: Secondary | ICD-10-CM | POA: Diagnosis not present

## 2018-09-16 DIAGNOSIS — R1310 Dysphagia, unspecified: Secondary | ICD-10-CM | POA: Diagnosis not present

## 2018-09-16 DIAGNOSIS — R41841 Cognitive communication deficit: Secondary | ICD-10-CM | POA: Diagnosis not present

## 2018-09-16 DIAGNOSIS — F319 Bipolar disorder, unspecified: Secondary | ICD-10-CM | POA: Diagnosis not present

## 2018-09-16 DIAGNOSIS — F0390 Unspecified dementia without behavioral disturbance: Secondary | ICD-10-CM | POA: Diagnosis not present

## 2018-09-16 DIAGNOSIS — F329 Major depressive disorder, single episode, unspecified: Secondary | ICD-10-CM | POA: Diagnosis not present

## 2018-09-17 DIAGNOSIS — R1310 Dysphagia, unspecified: Secondary | ICD-10-CM | POA: Diagnosis not present

## 2018-09-17 DIAGNOSIS — G300 Alzheimer's disease with early onset: Secondary | ICD-10-CM | POA: Diagnosis not present

## 2018-09-17 DIAGNOSIS — R41841 Cognitive communication deficit: Secondary | ICD-10-CM | POA: Diagnosis not present

## 2018-09-17 DIAGNOSIS — K219 Gastro-esophageal reflux disease without esophagitis: Secondary | ICD-10-CM | POA: Diagnosis not present

## 2018-09-17 DIAGNOSIS — G20 Parkinson's disease: Secondary | ICD-10-CM | POA: Diagnosis not present

## 2018-09-19 DIAGNOSIS — K219 Gastro-esophageal reflux disease without esophagitis: Secondary | ICD-10-CM | POA: Diagnosis not present

## 2018-09-19 DIAGNOSIS — R1310 Dysphagia, unspecified: Secondary | ICD-10-CM | POA: Diagnosis not present

## 2018-09-19 DIAGNOSIS — G20 Parkinson's disease: Secondary | ICD-10-CM | POA: Diagnosis not present

## 2018-09-19 DIAGNOSIS — R41841 Cognitive communication deficit: Secondary | ICD-10-CM | POA: Diagnosis not present

## 2018-09-19 DIAGNOSIS — G300 Alzheimer's disease with early onset: Secondary | ICD-10-CM | POA: Diagnosis not present

## 2018-09-20 DIAGNOSIS — G20 Parkinson's disease: Secondary | ICD-10-CM | POA: Diagnosis not present

## 2018-09-20 DIAGNOSIS — R41841 Cognitive communication deficit: Secondary | ICD-10-CM | POA: Diagnosis not present

## 2018-09-20 DIAGNOSIS — K219 Gastro-esophageal reflux disease without esophagitis: Secondary | ICD-10-CM | POA: Diagnosis not present

## 2018-09-20 DIAGNOSIS — R1310 Dysphagia, unspecified: Secondary | ICD-10-CM | POA: Diagnosis not present

## 2018-09-20 DIAGNOSIS — G300 Alzheimer's disease with early onset: Secondary | ICD-10-CM | POA: Diagnosis not present

## 2018-09-24 DIAGNOSIS — R41841 Cognitive communication deficit: Secondary | ICD-10-CM | POA: Diagnosis not present

## 2018-09-24 DIAGNOSIS — R1310 Dysphagia, unspecified: Secondary | ICD-10-CM | POA: Diagnosis not present

## 2018-09-24 DIAGNOSIS — G20 Parkinson's disease: Secondary | ICD-10-CM | POA: Diagnosis not present

## 2018-09-24 DIAGNOSIS — K219 Gastro-esophageal reflux disease without esophagitis: Secondary | ICD-10-CM | POA: Diagnosis not present

## 2018-09-24 DIAGNOSIS — G300 Alzheimer's disease with early onset: Secondary | ICD-10-CM | POA: Diagnosis not present

## 2018-09-25 DIAGNOSIS — G300 Alzheimer's disease with early onset: Secondary | ICD-10-CM | POA: Diagnosis not present

## 2018-09-25 DIAGNOSIS — R1310 Dysphagia, unspecified: Secondary | ICD-10-CM | POA: Diagnosis not present

## 2018-09-25 DIAGNOSIS — R41841 Cognitive communication deficit: Secondary | ICD-10-CM | POA: Diagnosis not present

## 2018-09-25 DIAGNOSIS — K219 Gastro-esophageal reflux disease without esophagitis: Secondary | ICD-10-CM | POA: Diagnosis not present

## 2018-09-25 DIAGNOSIS — G20 Parkinson's disease: Secondary | ICD-10-CM | POA: Diagnosis not present

## 2018-11-13 ENCOUNTER — Encounter: Payer: Medicare HMO | Admitting: *Deleted

## 2018-11-21 ENCOUNTER — Encounter: Payer: Self-pay | Admitting: Cardiology

## 2019-01-26 DIAGNOSIS — R41841 Cognitive communication deficit: Secondary | ICD-10-CM | POA: Diagnosis not present

## 2019-01-26 DIAGNOSIS — F0280 Dementia in other diseases classified elsewhere without behavioral disturbance: Secondary | ICD-10-CM | POA: Diagnosis not present

## 2019-01-27 DIAGNOSIS — F0280 Dementia in other diseases classified elsewhere without behavioral disturbance: Secondary | ICD-10-CM | POA: Diagnosis not present

## 2019-01-27 DIAGNOSIS — R41841 Cognitive communication deficit: Secondary | ICD-10-CM | POA: Diagnosis not present

## 2019-01-29 DIAGNOSIS — F0280 Dementia in other diseases classified elsewhere without behavioral disturbance: Secondary | ICD-10-CM | POA: Diagnosis not present

## 2019-01-29 DIAGNOSIS — R41841 Cognitive communication deficit: Secondary | ICD-10-CM | POA: Diagnosis not present

## 2019-01-31 DIAGNOSIS — R41841 Cognitive communication deficit: Secondary | ICD-10-CM | POA: Diagnosis not present

## 2019-01-31 DIAGNOSIS — F0280 Dementia in other diseases classified elsewhere without behavioral disturbance: Secondary | ICD-10-CM | POA: Diagnosis not present

## 2020-03-20 DEATH — deceased

## 2020-04-14 IMAGING — MR MR HEAD W/O CM
10 series · 48 of 48 positions shown · non-contrast
Comparison: Head CT from yesterday

CLINICAL DATA: Focal neuro deficit.  Altered mental status.

EXAM:
MRI HEAD WITHOUT CONTRAST
TECHNIQUE: Multiplanar, multiecho pulse sequences of the brain and surrounding
structures were obtained without intravenous contrast.

[Series 5: DWI · axial · 4.0mm · 0.88mm/px · z∈[-47,+97]mm · 10 of 74 slices shown (1 of 4)]
[im 1/74]
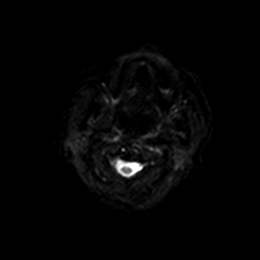
[im 9/74]
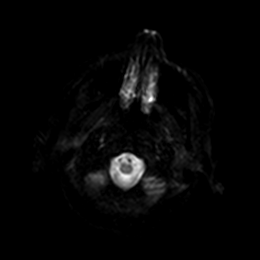
[im 17/74]
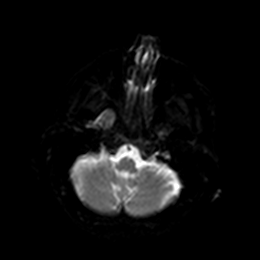
[im 25/74]
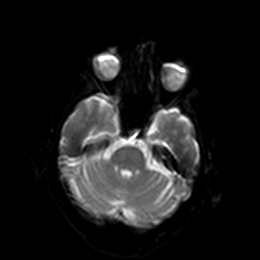
[im 33/74]
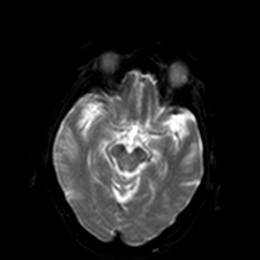
[im 41/74]
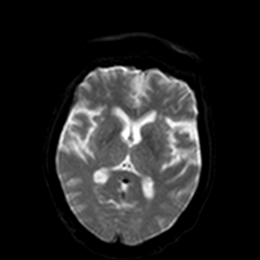
[im 49/74]
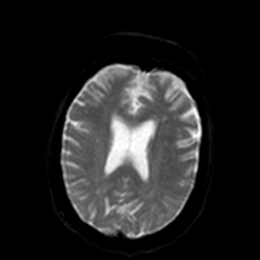
[im 57/74]
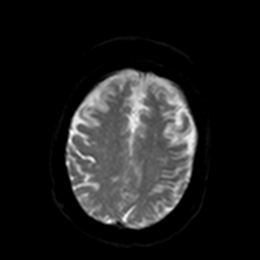
[im 65/74]
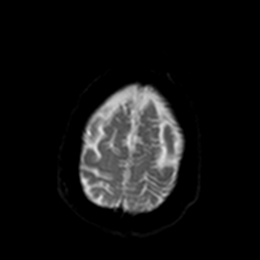
[im 74/74]
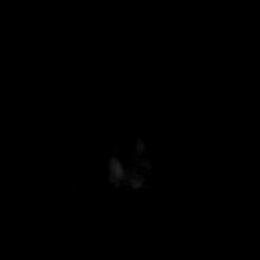

[Series 6: DWI · axial · 4.0mm · 0.88mm/px · z∈[-47,+97]mm · 5 of 37 slices shown (2 of 4)]
[im 1/37]
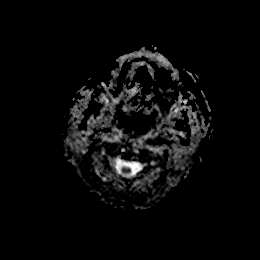
[im 10/37]
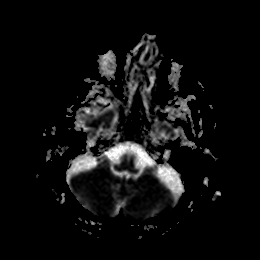
[im 19/37]
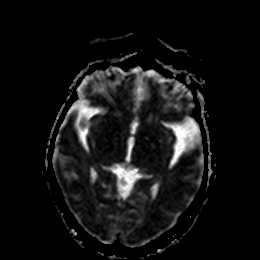
[im 28/37]
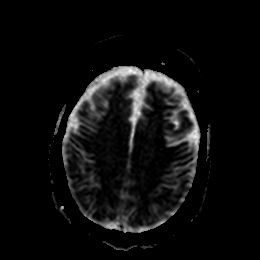
[im 37/37]
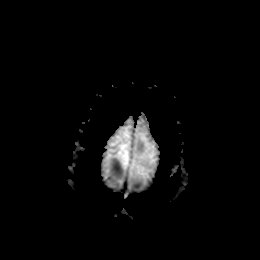

[Series 7: DWI · coronal · 4.0mm · 0.88mm/px · 9 of 70 slices shown (3 of 4)]
[im 1/70]
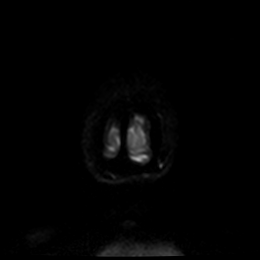
[im 9/70]
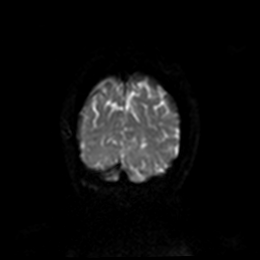
[im 18/70]
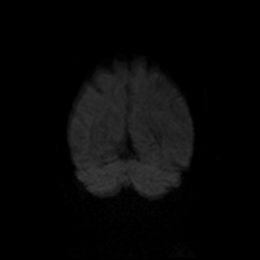
[im 26/70]
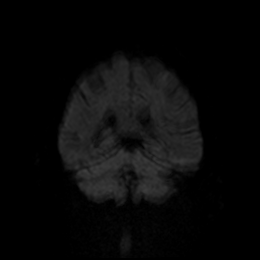
[im 35/70]
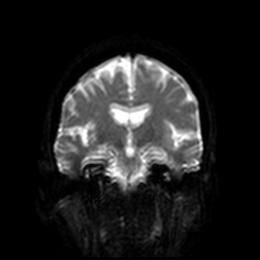
[im 44/70]
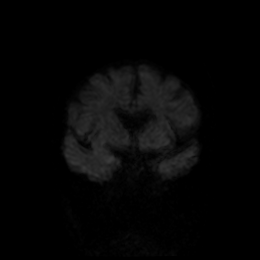
[im 52/70]
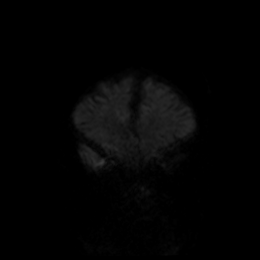
[im 61/70]
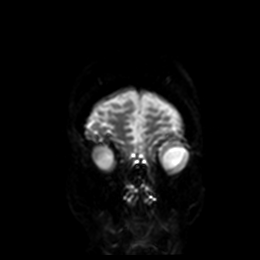
[im 70/70]
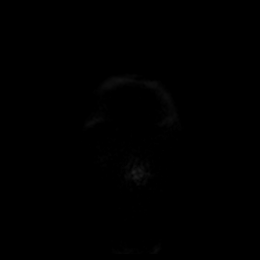

[Series 8: DWI · coronal · 4.0mm · 0.88mm/px · 5 of 35 slices shown (4 of 4)]
[im 1/35]
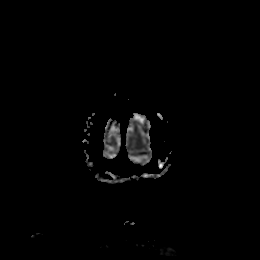
[im 9/35]
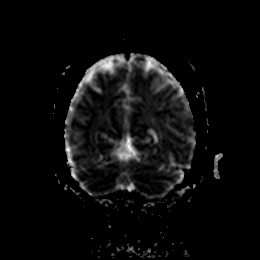
[im 18/35]
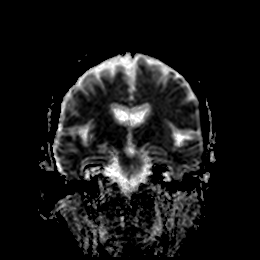
[im 26/35]
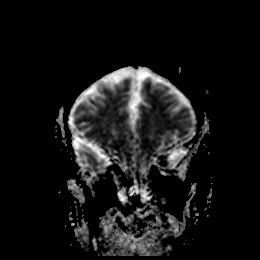
[im 35/35]
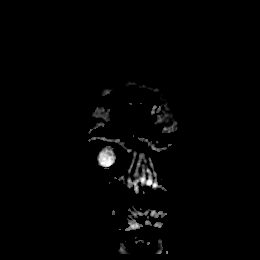

[Series 9: T2 · axial · 5.0mm · 0.72mm/px · z∈[-42,+101]mm · 3 of 25 slices shown (1 of 2)]
[im 1/25]
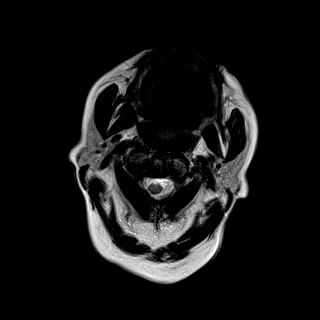
[im 13/25]
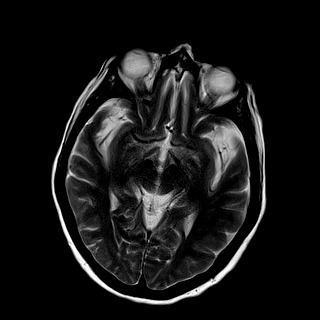
[im 25/25]
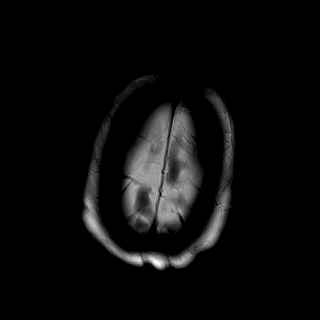

[Series 10: FLAIR · axial · 5.0mm · 0.90mm/px · z∈[-42,+101]mm · 3 of 25 slices shown (1 of 2)]
[im 1/25]
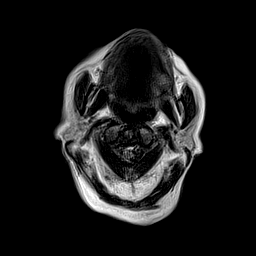
[im 13/25]
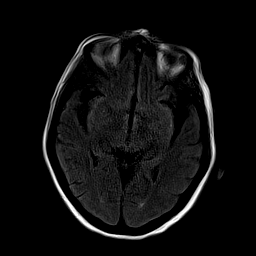
[im 25/25]
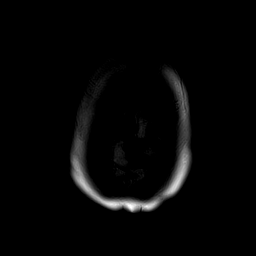

[Series 11: ax hemo · axial · 5.0mm · 0.86mm/px · z∈[-44,+100]mm · 3 of 25 slices shown]
[im 1/25]
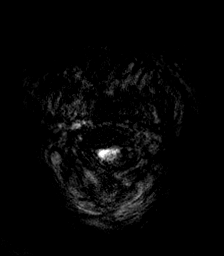
[im 13/25]
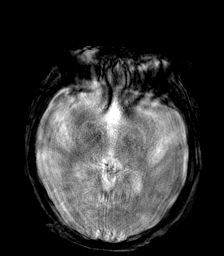
[im 25/25]
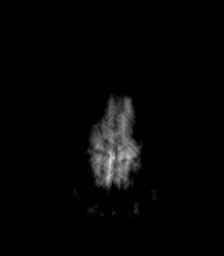

[Series 12: T2 · coronal · 5.0mm · 0.72mm/px · 4 of 28 slices shown (2 of 2)]
[im 1/28]
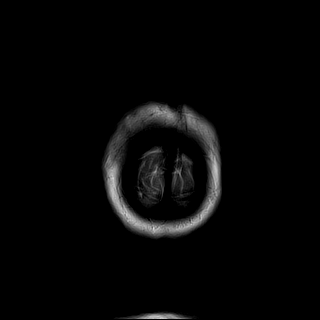
[im 10/28]
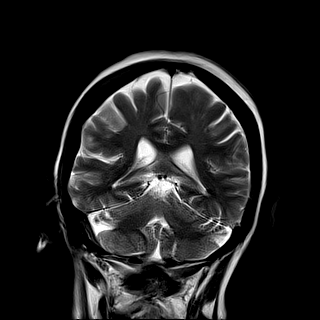
[im 19/28]
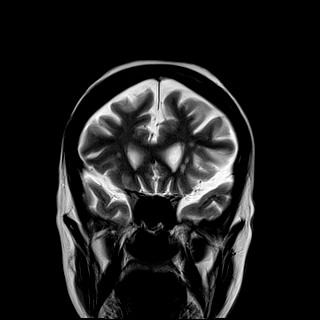
[im 28/28]
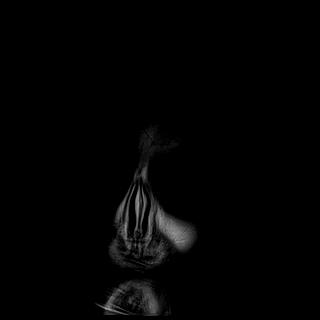

[Series 13: FLAIR · axial · 5.0mm · 0.90mm/px · z∈[-42,+101]mm · 3 of 25 slices shown (2 of 2)]
[im 1/25]
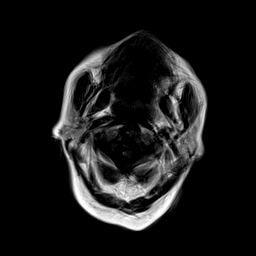
[im 13/25]
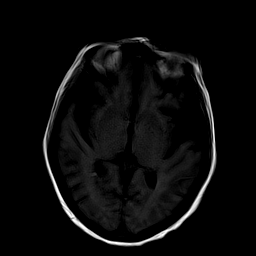
[im 25/25]
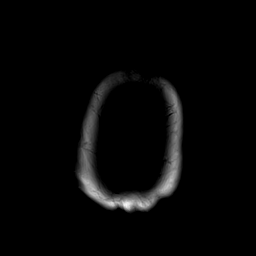

[Series 14: T1 · sagittal · 5.0mm · 0.90mm/px · 3 of 21 slices shown]
[im 1/21]
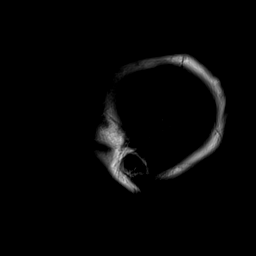
[im 11/21]
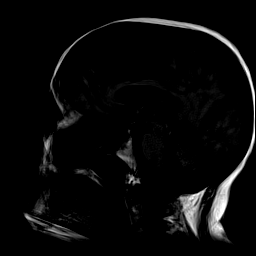
[im 21/21]
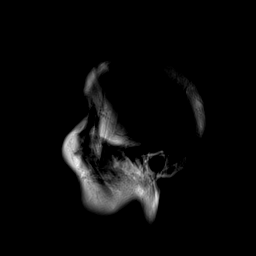

[48 of 48 positions shown; findings below may reference images not displayed]

FINDINGS: Brain: No acute infarction, hemorrhage, hydrocephalus, extra-axial
collection or mass lesion. History of dementia but no notable
atrophy. Minor periventricular chronic small vessel ischemia.

Vascular: Major flow voids are preserved

Skull and upper cervical spine: Negative for marrow lesion.

Sinuses/Orbits: Negative

Other: Significant and progressive motion degradation.
IMPRESSION: Significantly motion degraded study that is otherwise unremarkable
for age.
